# Patient Record
Sex: Male | Born: 1950 | Race: Black or African American | Hispanic: No | Marital: Married | Smoking: Never smoker
Health system: Southern US, Community
[De-identification: ages and names within clinical notes are randomized; demographics above are authoritative.]

## PROBLEM LIST (undated history)

## (undated) DIAGNOSIS — Z9989 Dependence on other enabling machines and devices: Secondary | ICD-10-CM

## (undated) DIAGNOSIS — G4733 Obstructive sleep apnea (adult) (pediatric): Secondary | ICD-10-CM

## (undated) DIAGNOSIS — N189 Chronic kidney disease, unspecified: Secondary | ICD-10-CM

## (undated) DIAGNOSIS — Z8679 Personal history of other diseases of the circulatory system: Secondary | ICD-10-CM

## (undated) DIAGNOSIS — Z8619 Personal history of other infectious and parasitic diseases: Secondary | ICD-10-CM

## (undated) DIAGNOSIS — Z955 Presence of coronary angioplasty implant and graft: Secondary | ICD-10-CM

## (undated) DIAGNOSIS — I252 Old myocardial infarction: Secondary | ICD-10-CM

## (undated) DIAGNOSIS — N401 Enlarged prostate with lower urinary tract symptoms: Secondary | ICD-10-CM

## (undated) DIAGNOSIS — IMO0001 Reserved for inherently not codable concepts without codable children: Secondary | ICD-10-CM

## (undated) DIAGNOSIS — R399 Unspecified symptoms and signs involving the genitourinary system: Secondary | ICD-10-CM

## (undated) DIAGNOSIS — Z7682 Awaiting organ transplant status: Secondary | ICD-10-CM

## (undated) DIAGNOSIS — N185 Chronic kidney disease, stage 5: Secondary | ICD-10-CM

## (undated) DIAGNOSIS — Z9889 Other specified postprocedural states: Secondary | ICD-10-CM

## (undated) DIAGNOSIS — Z992 Dependence on renal dialysis: Secondary | ICD-10-CM

## (undated) DIAGNOSIS — D631 Anemia in chronic kidney disease: Secondary | ICD-10-CM

## (undated) DIAGNOSIS — N2581 Secondary hyperparathyroidism of renal origin: Secondary | ICD-10-CM

## (undated) DIAGNOSIS — D72819 Decreased white blood cell count, unspecified: Secondary | ICD-10-CM

## (undated) DIAGNOSIS — D696 Thrombocytopenia, unspecified: Secondary | ICD-10-CM

## (undated) DIAGNOSIS — I2510 Atherosclerotic heart disease of native coronary artery without angina pectoris: Secondary | ICD-10-CM

## (undated) DIAGNOSIS — E785 Hyperlipidemia, unspecified: Secondary | ICD-10-CM

## (undated) DIAGNOSIS — C61 Malignant neoplasm of prostate: Secondary | ICD-10-CM

## (undated) DIAGNOSIS — Z972 Presence of dental prosthetic device (complete) (partial): Secondary | ICD-10-CM

## (undated) DIAGNOSIS — Z87828 Personal history of other (healed) physical injury and trauma: Secondary | ICD-10-CM

## (undated) HISTORY — DX: Decreased white blood cell count, unspecified: D72.819

## (undated) HISTORY — DX: Chronic kidney disease, stage 5: N18.5

## (undated) HISTORY — DX: Atherosclerotic heart disease of native coronary artery without angina pectoris: I25.10

## (undated) HISTORY — DX: Other specified postprocedural states: Z98.890

## (undated) HISTORY — DX: Hyperlipidemia, unspecified: E78.5

## (undated) HISTORY — DX: Presence of dental prosthetic device (complete) (partial): Z97.2

## (undated) HISTORY — DX: Thrombocytopenia, unspecified: D69.6

---

## 2001-07-17 ENCOUNTER — Emergency Department (HOSPITAL_COMMUNITY): Admission: EM | Admit: 2001-07-17 | Discharge: 2001-07-18 | Payer: Self-pay | Admitting: Emergency Medicine

## 2001-07-17 ENCOUNTER — Encounter: Payer: Self-pay | Admitting: Emergency Medicine

## 2002-02-21 ENCOUNTER — Ambulatory Visit (HOSPITAL_BASED_OUTPATIENT_CLINIC_OR_DEPARTMENT_OTHER): Admission: RE | Admit: 2002-02-21 | Discharge: 2002-02-21 | Payer: Self-pay | Attending: Urology | Admitting: Urology

## 2010-03-05 ENCOUNTER — Inpatient Hospital Stay (HOSPITAL_COMMUNITY): Payer: BLUE CROSS/BLUE SHIELD

## 2010-03-05 ENCOUNTER — Emergency Department (HOSPITAL_COMMUNITY): Payer: BLUE CROSS/BLUE SHIELD

## 2010-03-05 ENCOUNTER — Inpatient Hospital Stay (HOSPITAL_COMMUNITY)
Admission: EM | Admit: 2010-03-05 | Discharge: 2010-04-24 | DRG: 877 | Disposition: A | Discharge status: Rehabilitation Facility | Payer: BLUE CROSS/BLUE SHIELD | Attending: Surgery | Admitting: Surgery

## 2010-03-05 ENCOUNTER — Other Ambulatory Visit: Payer: Self-pay

## 2010-03-05 ENCOUNTER — Other Ambulatory Visit: Payer: Self-pay | Admitting: General Surgery

## 2010-03-05 ENCOUNTER — Other Ambulatory Visit: Payer: Self-pay | Admitting: Surgery

## 2010-03-05 DIAGNOSIS — A419 Sepsis, unspecified organism: Secondary | ICD-10-CM | POA: Diagnosis present

## 2010-03-05 DIAGNOSIS — I4891 Unspecified atrial fibrillation: Secondary | ICD-10-CM | POA: Diagnosis present

## 2010-03-05 DIAGNOSIS — J9600 Acute respiratory failure, unspecified whether with hypoxia or hypercapnia: Secondary | ICD-10-CM | POA: Diagnosis present

## 2010-03-05 DIAGNOSIS — S37039A Laceration of unspecified kidney, unspecified degree, initial encounter: Secondary | ICD-10-CM | POA: Diagnosis present

## 2010-03-05 DIAGNOSIS — K029 Dental caries, unspecified: Secondary | ICD-10-CM | POA: Diagnosis present

## 2010-03-05 DIAGNOSIS — N179 Acute kidney failure, unspecified: Secondary | ICD-10-CM | POA: Diagnosis not present

## 2010-03-05 DIAGNOSIS — N186 End stage renal disease: Secondary | ICD-10-CM | POA: Diagnosis not present

## 2010-03-05 DIAGNOSIS — S2190XA Unspecified open wound of unspecified part of thorax, initial encounter: Secondary | ICD-10-CM | POA: Diagnosis present

## 2010-03-05 DIAGNOSIS — K668 Other specified disorders of peritoneum: Secondary | ICD-10-CM | POA: Diagnosis present

## 2010-03-05 DIAGNOSIS — S2249XA Multiple fractures of ribs, unspecified side, initial encounter for closed fracture: Secondary | ICD-10-CM | POA: Diagnosis present

## 2010-03-05 DIAGNOSIS — S36503A Unspecified injury of sigmoid colon, initial encounter: Secondary | ICD-10-CM | POA: Diagnosis present

## 2010-03-05 DIAGNOSIS — D62 Acute posthemorrhagic anemia: Secondary | ICD-10-CM | POA: Diagnosis present

## 2010-03-05 DIAGNOSIS — S271XXA Traumatic hemothorax, initial encounter: Secondary | ICD-10-CM | POA: Diagnosis present

## 2010-03-05 DIAGNOSIS — T794XXA Traumatic shock, initial encounter: Secondary | ICD-10-CM | POA: Diagnosis present

## 2010-03-05 DIAGNOSIS — S3681XA Injury of peritoneum, initial encounter: Secondary | ICD-10-CM | POA: Diagnosis present

## 2010-03-05 DIAGNOSIS — I959 Hypotension, unspecified: Secondary | ICD-10-CM | POA: Diagnosis present

## 2010-03-05 DIAGNOSIS — S21309A Unspecified open wound of unspecified front wall of thorax with penetration into thoracic cavity, initial encounter: Secondary | ICD-10-CM | POA: Diagnosis present

## 2010-03-05 DIAGNOSIS — IMO0002 Reserved for concepts with insufficient information to code with codable children: Principal | ICD-10-CM | POA: Diagnosis present

## 2010-03-05 HISTORY — PX: OTHER SURGICAL HISTORY: SHX169

## 2010-03-05 LAB — POCT I-STAT 7, (LYTES, BLD GAS, ICA,H+H)
Acid-base deficit: 13 mmol/L — ABNORMAL HIGH (ref 0.0–2.0)
Acid-base deficit: 3 mmol/L — ABNORMAL HIGH (ref 0.0–2.0)
Acid-base deficit: 4 mmol/L — ABNORMAL HIGH (ref 0.0–2.0)
Bicarbonate: 16.2 meq/L — ABNORMAL LOW (ref 20.0–24.0)
Bicarbonate: 22.3 meq/L (ref 20.0–24.0)
Bicarbonate: 23.9 meq/L (ref 20.0–24.0)
Calcium, Ion: 0.79 mmol/L — ABNORMAL LOW (ref 1.12–1.32)
HCT: 23 % — ABNORMAL LOW (ref 39.0–52.0)
HCT: 23 % — ABNORMAL LOW (ref 39.0–52.0)
Hemoglobin: 9.5 g/dL — ABNORMAL LOW (ref 13.0–17.0)
O2 Saturation: 87 %
Patient temperature: 35.8
Patient temperature: 36.2
Sodium: 144 meq/L (ref 135–145)
Sodium: 145 meq/L (ref 135–145)
TCO2: 18 mmol/L (ref 0–100)
TCO2: 25 mmol/L (ref 0–100)
pCO2 arterial: 41.8 mmHg (ref 35.0–45.0)
pH, Arterial: 7.339 — ABNORMAL LOW (ref 7.350–7.450)
pH, Arterial: 7.383 (ref 7.350–7.450)
pO2, Arterial: 182 mmHg — ABNORMAL HIGH (ref 80.0–100.0)
pO2, Arterial: 67 mmHg — ABNORMAL LOW (ref 80.0–100.0)

## 2010-03-05 LAB — POCT I-STAT, CHEM 8
Chloride: 107 meq/L (ref 96–112)
HCT: 32 % — ABNORMAL LOW (ref 39.0–52.0)
Hemoglobin: 10.9 g/dL — ABNORMAL LOW (ref 13.0–17.0)
Potassium: 3.5 meq/L (ref 3.5–5.1)
Sodium: 142 meq/L (ref 135–145)

## 2010-03-05 LAB — CBC
Hemoglobin: 10.2 g/dL — ABNORMAL LOW (ref 13.0–17.0)
MCH: 29.6 pg (ref 26.0–34.0)
MCHC: 34.1 g/dL (ref 30.0–36.0)
Platelets: 86 10*3/uL — ABNORMAL LOW (ref 150–400)
RBC: 3.64 MIL/uL — ABNORMAL LOW (ref 4.22–5.81)
RBC: 3.11 MIL/uL — ABNORMAL LOW (ref 4.22–5.81)

## 2010-03-05 LAB — COMPREHENSIVE METABOLIC PANEL
AST: 141 U/L — ABNORMAL HIGH (ref 0–37)
Albumin: 3 g/dL — ABNORMAL LOW (ref 3.5–5.2)
Calcium: 8.4 mg/dL (ref 8.4–10.5)
Creatinine, Ser: 1.79 mg/dL — ABNORMAL HIGH (ref 0.4–1.5)
GFR calc Af Amer: 47 mL/min — ABNORMAL LOW (ref >60)
GFR calc non Af Amer: 39 mL/min — ABNORMAL LOW (ref >60)
Total Protein: 5.4 g/dL — ABNORMAL LOW (ref 6.0–8.3)

## 2010-03-05 LAB — PROTIME-INR
INR: 1.22 (ref 0.00–1.49)
Prothrombin Time: 15.6 seconds — ABNORMAL HIGH (ref 11.6–15.2)
Prothrombin Time: 19 seconds — ABNORMAL HIGH (ref 11.6–15.2)

## 2010-03-05 LAB — ABO/RH: ABO/RH(D): A NEG

## 2010-03-05 LAB — LACTIC ACID, PLASMA: Lactic Acid, Venous: 10.2 mmol/L — ABNORMAL HIGH (ref 0.5–2.2)

## 2010-03-05 MED ORDER — IOHEXOL 300 MG/ML  SOLN
100.0000 mL | Freq: Once | INTRAMUSCULAR | Status: AC | PRN
  Administered 2010-03-05: 100 mL via INTRAVENOUS

## 2010-03-06 ENCOUNTER — Inpatient Hospital Stay (HOSPITAL_COMMUNITY): Payer: BLUE CROSS/BLUE SHIELD

## 2010-03-06 LAB — PREPARE PLATELETS
Unit division: 0
Unit division: 0
Unit division: 0
Unit division: 0

## 2010-03-06 LAB — PREPARE CRYOPRECIPITATE: Unit division: 0

## 2010-03-06 LAB — CBC
HCT: 35.9 % — ABNORMAL LOW (ref 39.0–52.0)
Hemoglobin: 12.3 g/dL — ABNORMAL LOW (ref 13.0–17.0)
MCH: 28.8 pg (ref 26.0–34.0)
MCHC: 34.3 g/dL (ref 30.0–36.0)
MCV: 84.1 fL (ref 78.0–100.0)
MCV: 84.5 fL (ref 78.0–100.0)
Platelets: 103 10*3/uL — ABNORMAL LOW (ref 150–400)
RBC: 3.61 MIL/uL — ABNORMAL LOW (ref 4.22–5.81)
RDW: 14.8 % (ref 11.5–15.5)
WBC: 1.4 10*3/uL — CL (ref 4.0–10.5)

## 2010-03-06 LAB — BASIC METABOLIC PANEL
BUN: 14 mg/dL (ref 6–23)
BUN: 20 mg/dL (ref 6–23)
CO2: 24 mEq/L (ref 19–32)
Calcium: 8.5 mg/dL (ref 8.4–10.5)
Calcium: 8 mg/dL — ABNORMAL LOW (ref 8.4–10.5)
Chloride: 115 mEq/L — ABNORMAL HIGH (ref 96–112)
Creatinine, Ser: 1.5 mg/dL (ref 0.4–1.5)
Creatinine, Ser: 3.25 mg/dL — ABNORMAL HIGH (ref 0.4–1.5)
GFR calc Af Amer: 36 mL/min — ABNORMAL LOW (ref >60)
GFR calc non Af Amer: 48 mL/min — ABNORMAL LOW (ref >60)
GFR calc non Af Amer: 30 mL/min — ABNORMAL LOW (ref >60)
GFR calc non Af Amer: 20 mL/min — ABNORMAL LOW (ref >60)
Glucose, Bld: 119 mg/dL — ABNORMAL HIGH (ref 70–99)
Sodium: 143 mEq/L (ref 135–145)

## 2010-03-06 LAB — POCT I-STAT 3, ART BLOOD GAS (G3+)
Acid-base deficit: 1 mmol/L (ref 0.0–2.0)
Bicarbonate: 24.5 meq/L — ABNORMAL HIGH (ref 20.0–24.0)
Bicarbonate: 24.8 meq/L — ABNORMAL HIGH (ref 20.0–24.0)
O2 Saturation: 95 %
Patient temperature: 99.5
TCO2: 26 mmol/L (ref 0–100)
TCO2: 25 mmol/L (ref 0–100)
pCO2 arterial: 50.4 mmHg — ABNORMAL HIGH (ref 35.0–45.0)
pCO2 arterial: 42.5 mmHg (ref 35.0–45.0)
pCO2 arterial: 39.2 mmHg (ref 35.0–45.0)
pH, Arterial: 7.303 — ABNORMAL LOW (ref 7.350–7.450)
pH, Arterial: 7.361 (ref 7.350–7.450)
pO2, Arterial: 211 mmHg — ABNORMAL HIGH (ref 80.0–100.0)
pO2, Arterial: 275 mmHg — ABNORMAL HIGH (ref 80.0–100.0)
pO2, Arterial: 82 mmHg (ref 80.0–100.0)
pO2, Arterial: 91 mmHg (ref 80.0–100.0)

## 2010-03-06 LAB — URINE MICROSCOPIC-ADD ON

## 2010-03-06 LAB — PROTIME-INR
Prothrombin Time: 14.8 seconds (ref 11.6–15.2)
Prothrombin Time: 15 seconds (ref 11.6–15.2)

## 2010-03-06 LAB — HEMOGLOBIN AND HEMATOCRIT, BLOOD
HCT: 24.6 % — ABNORMAL LOW (ref 39.0–52.0)
Hemoglobin: 8.5 g/dL — ABNORMAL LOW (ref 13.0–17.0)
Hemoglobin: 10.9 g/dL — ABNORMAL LOW (ref 13.0–17.0)
Hemoglobin: 11.4 g/dL — ABNORMAL LOW (ref 13.0–17.0)

## 2010-03-06 LAB — DIFFERENTIAL
Basophils Absolute: 0 10*3/uL (ref 0.0–0.1)
Basophils Relative: 0 % (ref 0–1)
Eosinophils Absolute: 0 10*3/uL (ref 0.0–0.7)
Lymphocytes Relative: 32 % (ref 12–46)
Monocytes Relative: 7 % (ref 3–12)
Neutrophils Relative %: 61 % (ref 43–77)

## 2010-03-06 LAB — URINALYSIS, ROUTINE W REFLEX MICROSCOPIC
Ketones, ur: 15 mg/dL — AB
Protein, ur: >300 mg/dL — AB
Urine Glucose, Fasting: 100 mg/dL — AB

## 2010-03-06 LAB — CREATININE, URINE, RANDOM: Creatinine, Urine: 114.4 mg/dL

## 2010-03-07 ENCOUNTER — Inpatient Hospital Stay (HOSPITAL_COMMUNITY): Payer: BLUE CROSS/BLUE SHIELD

## 2010-03-07 ENCOUNTER — Other Ambulatory Visit: Payer: Self-pay | Admitting: General Surgery

## 2010-03-07 DIAGNOSIS — R578 Other shock: Secondary | ICD-10-CM

## 2010-03-07 DIAGNOSIS — R6521 Severe sepsis with septic shock: Secondary | ICD-10-CM

## 2010-03-07 DIAGNOSIS — J9600 Acute respiratory failure, unspecified whether with hypoxia or hypercapnia: Secondary | ICD-10-CM

## 2010-03-07 DIAGNOSIS — R6520 Severe sepsis without septic shock: Secondary | ICD-10-CM

## 2010-03-07 DIAGNOSIS — A419 Sepsis, unspecified organism: Secondary | ICD-10-CM

## 2010-03-07 HISTORY — PX: OTHER SURGICAL HISTORY: SHX169

## 2010-03-07 LAB — RENAL FUNCTION PANEL
BUN: 37 mg/dL — ABNORMAL HIGH (ref 6–23)
CO2: 17 mEq/L — ABNORMAL LOW (ref 19–32)
Calcium: 6.7 mg/dL — ABNORMAL LOW (ref 8.4–10.5)
Chloride: 109 mEq/L (ref 96–112)
Creatinine, Ser: 6.9 mg/dL — ABNORMAL HIGH (ref 0.4–1.5)
GFR calc Af Amer: 10 mL/min — ABNORMAL LOW (ref >60)
GFR calc non Af Amer: 8 mL/min — ABNORMAL LOW (ref >60)
Glucose, Bld: 63 mg/dL — ABNORMAL LOW (ref 70–99)
Phosphorus: 7.9 mg/dL — ABNORMAL HIGH (ref 2.3–4.6)
Potassium: 6.1 mEq/L — ABNORMAL HIGH (ref 3.5–5.1)

## 2010-03-07 LAB — PREPARE FRESH FROZEN PLASMA
Unit division: 0
Unit division: 0
Unit division: 0
Unit division: 0
Unit division: 0
Unit division: 0
Unit division: 0

## 2010-03-07 LAB — CBC
HCT: 35.1 % — ABNORMAL LOW (ref 39.0–52.0)
Hemoglobin: 11.8 g/dL — ABNORMAL LOW (ref 13.0–17.0)
Hemoglobin: 11 g/dL — ABNORMAL LOW (ref 13.0–17.0)
MCH: 29.3 pg (ref 26.0–34.0)
MCHC: 33 g/dL (ref 30.0–36.0)
MCV: 87.8 fL (ref 78.0–100.0)
Platelets: 78 10*3/uL — ABNORMAL LOW (ref 150–400)
RDW: 16.6 % — ABNORMAL HIGH (ref 11.5–15.5)
WBC: 5.7 10*3/uL (ref 4.0–10.5)

## 2010-03-07 LAB — BASIC METABOLIC PANEL
CO2: 17 mEq/L — ABNORMAL LOW (ref 19–32)
Chloride: 112 mEq/L (ref 96–112)
Creatinine, Ser: 6.02 mg/dL — ABNORMAL HIGH (ref 0.4–1.5)
GFR calc Af Amer: 12 mL/min — ABNORMAL LOW (ref >60)
Glucose, Bld: 69 mg/dL — ABNORMAL LOW (ref 70–99)

## 2010-03-07 LAB — POCT I-STAT 3, ART BLOOD GAS (G3+)
Bicarbonate: 18 meq/L — ABNORMAL LOW (ref 20.0–24.0)
O2 Saturation: 85 %
Patient temperature: 102
Patient temperature: 101.1
TCO2: 19 mmol/L (ref 0–100)
TCO2: 19 mmol/L (ref 0–100)
pH, Arterial: 7.19 — CL (ref 7.350–7.450)
pH, Arterial: 7.212 — ABNORMAL LOW (ref 7.350–7.450)

## 2010-03-07 LAB — DIFFERENTIAL
Basophils Relative: 0 % (ref 0–1)
Eosinophils Relative: 0 % (ref 0–5)
Lymphs Abs: 1 10*3/uL (ref 0.7–4.0)
Monocytes Relative: 7 % (ref 3–12)
Neutrophils Relative %: 75 % (ref 43–77)

## 2010-03-07 LAB — COMPREHENSIVE METABOLIC PANEL
ALT: 1123 U/L — ABNORMAL HIGH (ref 0–53)
AST: 1907 U/L — ABNORMAL HIGH (ref 0–37)
Albumin: 2.3 g/dL — ABNORMAL LOW (ref 3.5–5.2)
Alkaline Phosphatase: 35 U/L — ABNORMAL LOW (ref 39–117)
Chloride: 111 mEq/L (ref 96–112)
GFR calc Af Amer: 10 mL/min — ABNORMAL LOW (ref >60)
Potassium: 6.4 mEq/L (ref 3.5–5.1)
Sodium: 142 mEq/L (ref 135–145)
Total Bilirubin: 3.5 mg/dL — ABNORMAL HIGH (ref 0.3–1.2)

## 2010-03-07 LAB — PROCALCITONIN: Procalcitonin: >175 ng/mL

## 2010-03-07 LAB — HEMOGLOBIN AND HEMATOCRIT, BLOOD: HCT: 34.8 % — ABNORMAL LOW (ref 39.0–52.0)
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
## 2010-03-08 ENCOUNTER — Inpatient Hospital Stay (HOSPITAL_COMMUNITY): Payer: BLUE CROSS/BLUE SHIELD

## 2010-03-08 HISTORY — PX: OTHER SURGICAL HISTORY: SHX169

## 2010-03-08 LAB — POCT I-STAT 3, ART BLOOD GAS (G3+)
Acid-base deficit: 16 mmol/L — ABNORMAL HIGH (ref 0.0–2.0)
Acid-base deficit: 16 mmol/L — ABNORMAL HIGH (ref 0.0–2.0)
Bicarbonate: 12.3 meq/L — ABNORMAL LOW (ref 20.0–24.0)
Bicarbonate: 14.1 meq/L — ABNORMAL LOW (ref 20.0–24.0)
Bicarbonate: 14.9 meq/L — ABNORMAL LOW (ref 20.0–24.0)
Bicarbonate: 15.6 meq/L — ABNORMAL LOW (ref 20.0–24.0)
Bicarbonate: 15.9 meq/L — ABNORMAL LOW (ref 20.0–24.0)
Bicarbonate: 17.6 meq/L — ABNORMAL LOW (ref 20.0–24.0)
O2 Saturation: 88 %
O2 Saturation: 88 %
Patient temperature: 93.5
Patient temperature: 97.2
Patient temperature: 97.4
Patient temperature: 97.4
Patient temperature: 97.6
TCO2: 17 mmol/L (ref 0–100)
TCO2: 14 mmol/L (ref 0–100)
pCO2 arterial: 40.1 mmHg (ref 35.0–45.0)
pCO2 arterial: 40.8 mmHg (ref 35.0–45.0)
pH, Arterial: 7.07 — CL (ref 7.350–7.450)
pH, Arterial: 7.07 — CL (ref 7.350–7.450)
pH, Arterial: 7.093 — CL (ref 7.350–7.450)
pH, Arterial: 7.097 — CL (ref 7.350–7.450)
pH, Arterial: 7.165 — CL (ref 7.350–7.450)
pH, Arterial: 7.272 — ABNORMAL LOW (ref 7.350–7.450)
pO2, Arterial: 60 mmHg — ABNORMAL LOW (ref 80.0–100.0)
pO2, Arterial: 65 mmHg — ABNORMAL LOW (ref 80.0–100.0)
pO2, Arterial: 69 mmHg — ABNORMAL LOW (ref 80.0–100.0)

## 2010-03-08 LAB — GLUCOSE, CAPILLARY
Glucose-Capillary: 83 mg/dL (ref 70–99)
Glucose-Capillary: 55 mg/dL — ABNORMAL LOW (ref 70–99)
Glucose-Capillary: 87 mg/dL (ref 70–99)

## 2010-03-08 LAB — COMPREHENSIVE METABOLIC PANEL
ALT: 1398 U/L — ABNORMAL HIGH (ref 0–53)
AST: 1994 U/L — ABNORMAL HIGH (ref 0–37)
CO2: 16 mEq/L — ABNORMAL LOW (ref 19–32)
Calcium: 6.5 mg/dL — ABNORMAL LOW (ref 8.4–10.5)
Chloride: 105 mEq/L (ref 96–112)
GFR calc Af Amer: 10 mL/min — ABNORMAL LOW (ref >60)
GFR calc non Af Amer: 9 mL/min — ABNORMAL LOW (ref >60)
Sodium: 139 mEq/L (ref 135–145)
Total Bilirubin: 4.1 mg/dL — ABNORMAL HIGH (ref 0.3–1.2)

## 2010-03-08 LAB — BASIC METABOLIC PANEL
BUN: 29 mg/dL — ABNORMAL HIGH (ref 6–23)
CO2: 13 mEq/L — ABNORMAL LOW (ref 19–32)
Chloride: 109 mEq/L (ref 96–112)
Creatinine, Ser: 5.88 mg/dL — ABNORMAL HIGH (ref 0.4–1.5)
Creatinine, Ser: 5.58 mg/dL — ABNORMAL HIGH (ref 0.4–1.5)
GFR calc Af Amer: 12 mL/min — ABNORMAL LOW (ref >60)
GFR calc non Af Amer: 11 mL/min — ABNORMAL LOW (ref >60)
Potassium: 4.9 mEq/L (ref 3.5–5.1)
Potassium: 4.4 meq/L (ref 3.5–5.1)
Sodium: 141 mEq/L (ref 135–145)

## 2010-03-08 LAB — URINALYSIS, ROUTINE W REFLEX MICROSCOPIC
Ketones, ur: 15 mg/dL — AB
Nitrite: POSITIVE — AB
Specific Gravity, Urine: 1.03 (ref 1.005–1.030)
Urobilinogen, UA: 1 mg/dL (ref 0.0–1.0)

## 2010-03-08 LAB — RENAL FUNCTION PANEL
Albumin: 2.1 g/dL — ABNORMAL LOW (ref 3.5–5.2)
Calcium: 6.2 mg/dL — CL (ref 8.4–10.5)
GFR calc Af Amer: 12 mL/min — ABNORMAL LOW (ref >60)
GFR calc non Af Amer: 10 mL/min — ABNORMAL LOW (ref >60)
Phosphorus: 9.2 mg/dL (ref 2.3–4.6)
Potassium: 5.2 mEq/L — ABNORMAL HIGH (ref 3.5–5.1)
Sodium: 137 mEq/L (ref 135–145)

## 2010-03-08 LAB — HEMOGLOBIN AND HEMATOCRIT, BLOOD
HCT: 34.2 % — ABNORMAL LOW (ref 39.0–52.0)
Hemoglobin: 10.7 g/dL — ABNORMAL LOW (ref 13.0–17.0)

## 2010-03-08 LAB — APTT: aPTT: 42 seconds — ABNORMAL HIGH (ref 24–37)

## 2010-03-08 LAB — CARBOXYHEMOGLOBIN
Carboxyhemoglobin: 0.6 % (ref 0.5–1.5)
Methemoglobin: 1 % (ref 0.0–1.5)
O2 Saturation: 96.3 %
Total hemoglobin: 12.3 g/dL — ABNORMAL LOW (ref 13.5–18.0)

## 2010-03-08 LAB — URINE MICROSCOPIC-ADD ON

## 2010-03-08 LAB — CBC
Hemoglobin: 11.4 g/dL — ABNORMAL LOW (ref 13.0–17.0)
RBC: 3.99 MIL/uL — ABNORMAL LOW (ref 4.22–5.81)
WBC: 9.9 10*3/uL (ref 4.0–10.5)

## 2010-03-09 ENCOUNTER — Inpatient Hospital Stay (HOSPITAL_COMMUNITY): Payer: BLUE CROSS/BLUE SHIELD

## 2010-03-09 DIAGNOSIS — R579 Shock, unspecified: Secondary | ICD-10-CM

## 2010-03-09 DIAGNOSIS — R570 Cardiogenic shock: Secondary | ICD-10-CM

## 2010-03-09 LAB — TYPE AND SCREEN
Unit division: 0
Unit division: 0
Unit division: 0
Unit division: 0
Unit division: 0
Unit division: 0
Unit division: 0
Unit division: 0
Unit division: 0
Unit division: 0
Unit division: 0
Unit division: 0
Unit division: 0
Unit division: 0

## 2010-03-09 LAB — POCT I-STAT 3, ART BLOOD GAS (G3+)
Acid-Base Excess: 4 mmol/L — ABNORMAL HIGH (ref 0.0–2.0)
Acid-Base Excess: 6 mmol/L — ABNORMAL HIGH (ref 0.0–2.0)
Acid-base deficit: 8 mmol/L — ABNORMAL HIGH (ref 0.0–2.0)
Acid-base deficit: 4 mmol/L — ABNORMAL HIGH (ref 0.0–2.0)
Acid-base deficit: 2 mmol/L (ref 0.0–2.0)
Acid-base deficit: 1 mmol/L (ref 0.0–2.0)
Bicarbonate: 18.4 meq/L — ABNORMAL LOW (ref 20.0–24.0)
Bicarbonate: 21.9 meq/L (ref 20.0–24.0)
Bicarbonate: 23 meq/L (ref 20.0–24.0)
Bicarbonate: 24.2 meq/L — ABNORMAL HIGH (ref 20.0–24.0)
Bicarbonate: 31.2 meq/L — ABNORMAL HIGH (ref 20.0–24.0)
Bicarbonate: 34.5 meq/L — ABNORMAL HIGH (ref 20.0–24.0)
O2 Saturation: 93 %
O2 Saturation: 93 %
O2 Saturation: 97 %
O2 Saturation: 97 %
O2 Saturation: 97 %
O2 Saturation: 97 %
Patient temperature: 93.9
Patient temperature: 93.9
Patient temperature: 94.3
Patient temperature: 97.5
Patient temperature: 98.7
TCO2: 20 mmol/L (ref 0–100)
TCO2: 23 mmol/L (ref 0–100)
TCO2: 24 mmol/L (ref 0–100)
TCO2: 25 mmol/L (ref 0–100)
TCO2: 33 mmol/L (ref 0–100)
pH, Arterial: 7.308 — ABNORMAL LOW (ref 7.350–7.450)
pH, Arterial: 7.448 (ref 7.350–7.450)
pH, Arterial: 7.497 — ABNORMAL HIGH (ref 7.350–7.450)
pO2, Arterial: 77 mmHg — ABNORMAL LOW (ref 80.0–100.0)
pO2, Arterial: 81 mmHg (ref 80.0–100.0)
pO2, Arterial: 95 mmHg (ref 80.0–100.0)

## 2010-03-09 LAB — CBC
HCT: 32 % — ABNORMAL LOW (ref 39.0–52.0)
Hemoglobin: 10.9 g/dL — ABNORMAL LOW (ref 13.0–17.0)
MCHC: 34.1 g/dL (ref 30.0–36.0)
MCV: 84.9 fL (ref 78.0–100.0)
RDW: 16 % — ABNORMAL HIGH (ref 11.5–15.5)
WBC: 7.6 10*3/uL (ref 4.0–10.5)

## 2010-03-09 LAB — CARDIAC PANEL(CRET KIN+CKTOT+MB+TROPI)
CK, MB: 151.7 ng/mL (ref 0.3–4.0)
Relative Index: 0.6 (ref 0.0–2.5)
Troponin I: 0.53 ng/mL (ref 0.00–0.06)

## 2010-03-09 LAB — RENAL FUNCTION PANEL
Albumin: 1.6 g/dL — ABNORMAL LOW (ref 3.5–5.2)
CO2: 27 mEq/L (ref 19–32)
Calcium: 5.7 mg/dL — CL (ref 8.4–10.5)
Creatinine, Ser: 4.03 mg/dL — ABNORMAL HIGH (ref 0.4–1.5)
GFR calc Af Amer: 19 mL/min — ABNORMAL LOW (ref >60)
GFR calc non Af Amer: 15 mL/min — ABNORMAL LOW (ref >60)

## 2010-03-09 LAB — COMPREHENSIVE METABOLIC PANEL
AST: 3048 U/L — ABNORMAL HIGH (ref 0–37)
Albumin: 1.9 g/dL — ABNORMAL LOW (ref 3.5–5.2)
Alkaline Phosphatase: 62 U/L (ref 39–117)
CO2: 21 mEq/L (ref 19–32)
Chloride: 92 mEq/L — ABNORMAL LOW (ref 96–112)
Creatinine, Ser: 4.81 mg/dL — ABNORMAL HIGH (ref 0.4–1.5)
GFR calc Af Amer: 15 mL/min — ABNORMAL LOW (ref >60)
GFR calc non Af Amer: 12 mL/min — ABNORMAL LOW (ref >60)
Potassium: 3.8 mEq/L (ref 3.5–5.1)
Total Bilirubin: 5.3 mg/dL — ABNORMAL HIGH (ref 0.3–1.2)

## 2010-03-09 LAB — BASIC METABOLIC PANEL
CO2: 31 meq/L (ref 19–32)
Chloride: 85 mEq/L — ABNORMAL LOW (ref 96–112)
Chloride: 86 meq/L — ABNORMAL LOW (ref 96–112)
Creatinine, Ser: 4.32 mg/dL — ABNORMAL HIGH (ref 0.4–1.5)
Creatinine, Ser: 3.92 mg/dL — ABNORMAL HIGH (ref 0.4–1.5)
GFR calc Af Amer: 17 mL/min — ABNORMAL LOW (ref >60)
GFR calc Af Amer: 19 mL/min — ABNORMAL LOW (ref >60)
GFR calc non Af Amer: 14 mL/min — ABNORMAL LOW (ref >60)
Potassium: 3.3 mEq/L — ABNORMAL LOW (ref 3.5–5.1)
Potassium: 3.2 meq/L — ABNORMAL LOW (ref 3.5–5.1)

## 2010-03-09 LAB — DIFFERENTIAL
Basophils Absolute: 0 10*3/uL (ref 0.0–0.1)
Eosinophils Relative: 0 % (ref 0–5)
Lymphocytes Relative: 7 % — ABNORMAL LOW (ref 12–46)
Lymphs Abs: 0.5 10*3/uL — ABNORMAL LOW (ref 0.7–4.0)
Monocytes Relative: 2 % — ABNORMAL LOW (ref 3–12)
Neutro Abs: 6.9 10*3/uL (ref 1.7–7.7)

## 2010-03-09 LAB — GLUCOSE, CAPILLARY
Glucose-Capillary: 77 mg/dL (ref 70–99)
Glucose-Capillary: 40 mg/dL — CL (ref 70–99)
Glucose-Capillary: 62 mg/dL — ABNORMAL LOW (ref 70–99)
Glucose-Capillary: 85 mg/dL (ref 70–99)

## 2010-03-09 LAB — URINE CULTURE
Colony Count: NO GROWTH
Culture: NO GROWTH

## 2010-03-09 LAB — CK: Total CK: 21406 U/L — ABNORMAL HIGH (ref 7–232)

## 2010-03-09 LAB — HEMOGLOBIN AND HEMATOCRIT, BLOOD
HCT: 28.5 % — ABNORMAL LOW (ref 39.0–52.0)
Hemoglobin: 9.7 g/dL — ABNORMAL LOW (ref 13.0–17.0)
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
## 2010-03-10 ENCOUNTER — Inpatient Hospital Stay (HOSPITAL_COMMUNITY): Payer: BLUE CROSS/BLUE SHIELD

## 2010-03-10 HISTORY — PX: OTHER SURGICAL HISTORY: SHX169

## 2010-03-10 LAB — POCT I-STAT 7, (LYTES, BLD GAS, ICA,H+H)
Acid-base deficit: 11 mmol/L — ABNORMAL HIGH (ref 0.0–2.0)
Acid-base deficit: 18 mmol/L — ABNORMAL HIGH (ref 0.0–2.0)
Bicarbonate: 17.7 meq/L — ABNORMAL LOW (ref 20.0–24.0)
Calcium, Ion: 1.09 mmol/L — ABNORMAL LOW (ref 1.12–1.32)
Calcium, Ion: 0.79 mmol/L — ABNORMAL LOW (ref 1.12–1.32)
HCT: 31 % — ABNORMAL LOW (ref 39.0–52.0)
HCT: 32 % — ABNORMAL LOW (ref 39.0–52.0)
Hemoglobin: 10.5 g/dL — ABNORMAL LOW (ref 13.0–17.0)
O2 Saturation: 81 %
Patient temperature: 39.6
Potassium: 6.2 meq/L — ABNORMAL HIGH (ref 3.5–5.1)
Sodium: 142 meq/L (ref 135–145)
Sodium: 138 meq/L (ref 135–145)
TCO2: 19 mmol/L (ref 0–100)
pCO2 arterial: 57.4 mmHg (ref 35.0–45.0)
pH, Arterial: 7.114 — CL (ref 7.350–7.450)
pO2, Arterial: 70 mmHg — ABNORMAL LOW (ref 80.0–100.0)
pO2, Arterial: 45 mmHg — ABNORMAL LOW (ref 80.0–100.0)

## 2010-03-10 LAB — COMPREHENSIVE METABOLIC PANEL
AST: 1261 U/L — ABNORMAL HIGH (ref 0–37)
Albumin: 1.6 g/dL — ABNORMAL LOW (ref 3.5–5.2)
BUN: 25 mg/dL — ABNORMAL HIGH (ref 6–23)
Calcium: 6.3 mg/dL — CL (ref 8.4–10.5)
Creatinine, Ser: 3.7 mg/dL — ABNORMAL HIGH (ref 0.4–1.5)
GFR calc Af Amer: 20 mL/min — ABNORMAL LOW (ref >60)

## 2010-03-10 LAB — POCT I-STAT 3, ART BLOOD GAS (G3+)
Acid-Base Excess: 3 mmol/L — ABNORMAL HIGH (ref 0.0–2.0)
Bicarbonate: 26.8 meq/L — ABNORMAL HIGH (ref 20.0–24.0)
Bicarbonate: 25.1 meq/L — ABNORMAL HIGH (ref 20.0–24.0)
O2 Saturation: 97 %
Patient temperature: 98.3
TCO2: 26 mmol/L (ref 0–100)
pCO2 arterial: 36.1 mmHg (ref 35.0–45.0)
pCO2 arterial: 43.2 mmHg (ref 35.0–45.0)
pH, Arterial: 7.474 — ABNORMAL HIGH (ref 7.350–7.450)
pH, Arterial: 7.372 (ref 7.350–7.450)
pO2, Arterial: 122 mmHg — ABNORMAL HIGH (ref 80.0–100.0)
pO2, Arterial: 90 mmHg (ref 80.0–100.0)

## 2010-03-10 LAB — BASIC METABOLIC PANEL
BUN: 27 mg/dL — ABNORMAL HIGH (ref 6–23)
CO2: 25 mEq/L (ref 19–32)
CO2: 25 mEq/L (ref 19–32)
Calcium: 6.4 mg/dL — CL (ref 8.4–10.5)
Calcium: 6.4 mg/dL — CL (ref 8.4–10.5)
Chloride: 98 mEq/L (ref 96–112)
Chloride: 97 mEq/L (ref 96–112)
Creatinine, Ser: 3.69 mg/dL — ABNORMAL HIGH (ref 0.4–1.5)
Creatinine, Ser: 3.59 mg/dL — ABNORMAL HIGH (ref 0.4–1.5)
GFR calc Af Amer: 21 mL/min — ABNORMAL LOW (ref >60)
GFR calc Af Amer: 21 mL/min — ABNORMAL LOW (ref >60)
GFR calc non Af Amer: 17 mL/min — ABNORMAL LOW (ref >60)
GFR calc non Af Amer: 17 mL/min — ABNORMAL LOW (ref >60)
Glucose, Bld: 86 mg/dL (ref 70–99)
Glucose, Bld: 180 mg/dL — ABNORMAL HIGH (ref 70–99)
Potassium: 3 mEq/L — ABNORMAL LOW (ref 3.5–5.1)
Potassium: 3.1 mEq/L — ABNORMAL LOW (ref 3.5–5.1)
Sodium: 134 mEq/L — ABNORMAL LOW (ref 135–145)
Sodium: 134 mEq/L — ABNORMAL LOW (ref 135–145)

## 2010-03-10 LAB — CULTURE, BAL-QUANTITATIVE W GRAM STAIN

## 2010-03-10 LAB — RENAL FUNCTION PANEL
Albumin: 1.5 g/dL — ABNORMAL LOW (ref 3.5–5.2)
BUN: 28 mg/dL — ABNORMAL HIGH (ref 6–23)
CO2: 25 mEq/L (ref 19–32)
Calcium: 6.4 mg/dL — CL (ref 8.4–10.5)
Chloride: 98 mEq/L (ref 96–112)
Creatinine, Ser: 3.58 mg/dL — ABNORMAL HIGH (ref 0.4–1.5)
GFR calc Af Amer: 21 mL/min — ABNORMAL LOW (ref >60)
GFR calc non Af Amer: 18 mL/min — ABNORMAL LOW (ref >60)
Glucose, Bld: 176 mg/dL — ABNORMAL HIGH (ref 70–99)
Phosphorus: 4.5 mg/dL (ref 2.3–4.6)
Potassium: 3 mEq/L — ABNORMAL LOW (ref 3.5–5.1)
Sodium: 133 mEq/L — ABNORMAL LOW (ref 135–145)

## 2010-03-10 LAB — MAGNESIUM: Magnesium: 2.1 mg/dL (ref 1.5–2.5)

## 2010-03-10 LAB — GLUCOSE, CAPILLARY
Glucose-Capillary: 57 mg/dL — ABNORMAL LOW (ref 70–99)
Glucose-Capillary: 94 mg/dL (ref 70–99)
Glucose-Capillary: 114 mg/dL — ABNORMAL HIGH (ref 70–99)
Glucose-Capillary: 49 mg/dL — ABNORMAL LOW (ref 70–99)
Glucose-Capillary: 84 mg/dL (ref 70–99)
Glucose-Capillary: 102 mg/dL — ABNORMAL HIGH (ref 70–99)
Glucose-Capillary: 62 mg/dL — ABNORMAL LOW (ref 70–99)
Glucose-Capillary: 123 mg/dL — ABNORMAL HIGH (ref 70–99)
Glucose-Capillary: 66 mg/dL — ABNORMAL LOW (ref 70–99)
Glucose-Capillary: 111 mg/dL — ABNORMAL HIGH (ref 70–99)

## 2010-03-10 LAB — CBC
HCT: 27.9 % — ABNORMAL LOW (ref 39.0–52.0)
Hemoglobin: 9.6 g/dL — ABNORMAL LOW (ref 13.0–17.0)
RBC: 3.34 MIL/uL — ABNORMAL LOW (ref 4.22–5.81)
WBC: 15.9 10*3/uL — ABNORMAL HIGH (ref 4.0–10.5)

## 2010-03-10 LAB — PHOSPHORUS: Phosphorus: 4.4 mg/dL (ref 2.3–4.6)

## 2010-03-10 LAB — PROTIME-INR
INR: 1.54 — ABNORMAL HIGH (ref 0.00–1.49)
Prothrombin Time: 18.7 seconds — ABNORMAL HIGH (ref 11.6–15.2)

## 2010-03-10 LAB — APTT: aPTT: 34 seconds (ref 24–37)

## 2010-03-10 LAB — LIPASE, BLOOD: Lipase: 18 U/L (ref 11–59)
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
## 2010-03-11 ENCOUNTER — Inpatient Hospital Stay (HOSPITAL_COMMUNITY): Payer: BLUE CROSS/BLUE SHIELD

## 2010-03-11 ENCOUNTER — Other Ambulatory Visit (HOSPITAL_COMMUNITY): Payer: Self-pay | Admitting: *Deleted

## 2010-03-11 DIAGNOSIS — A419 Sepsis, unspecified organism: Secondary | ICD-10-CM

## 2010-03-11 LAB — RENAL FUNCTION PANEL
Albumin: 1.6 g/dL — ABNORMAL LOW (ref 3.5–5.2)
Chloride: 103 mEq/L (ref 96–112)
GFR calc Af Amer: 27 mL/min — ABNORMAL LOW (ref >60)
GFR calc non Af Amer: 23 mL/min — ABNORMAL LOW (ref >60)
Phosphorus: 2.8 mg/dL (ref 2.3–4.6)
Potassium: 3.7 mEq/L (ref 3.5–5.1)

## 2010-03-11 LAB — DIFFERENTIAL
Blasts: 0 %
Lymphocytes Relative: 35 % (ref 12–46)
Lymphs Abs: 7.1 10*3/uL — ABNORMAL HIGH (ref 0.7–4.0)
Neutro Abs: 11.2 10*3/uL — ABNORMAL HIGH (ref 1.7–7.7)
Neutrophils Relative %: 55 % (ref 43–77)
Promyelocytes Absolute: 0 %
nRBC: 0 /100 WBC

## 2010-03-11 LAB — POCT I-STAT 3, ART BLOOD GAS (G3+)
Acid-Base Excess: 1 mmol/L (ref 0.0–2.0)
O2 Saturation: 98 %
O2 Saturation: 91 %
Patient temperature: 97.8
TCO2: 28 mmol/L (ref 0–100)
TCO2: 27 mmol/L (ref 0–100)
pCO2 arterial: 43.2 mmHg (ref 35.0–45.0)
pCO2 arterial: 47.1 mmHg — ABNORMAL HIGH (ref 35.0–45.0)
pO2, Arterial: 66 mmHg — ABNORMAL LOW (ref 80.0–100.0)

## 2010-03-11 LAB — COMPREHENSIVE METABOLIC PANEL
ALT: 906 U/L — ABNORMAL HIGH (ref 0–53)
Albumin: 1.6 g/dL — ABNORMAL LOW (ref 3.5–5.2)
Alkaline Phosphatase: 81 U/L (ref 39–117)
BUN: 26 mg/dL — ABNORMAL HIGH (ref 6–23)
Chloride: 100 mEq/L (ref 96–112)
Glucose, Bld: 97 mg/dL (ref 70–99)
Potassium: 3.5 mEq/L (ref 3.5–5.1)
Total Bilirubin: 7.4 mg/dL — ABNORMAL HIGH (ref 0.3–1.2)

## 2010-03-11 LAB — PHOSPHORUS: Phosphorus: 3.1 mg/dL (ref 2.3–4.6)

## 2010-03-11 LAB — CBC
HCT: 26.8 % — ABNORMAL LOW (ref 39.0–52.0)
MCHC: 34 g/dL (ref 30.0–36.0)
MCV: 84.5 fL (ref 78.0–100.0)
RDW: 15.8 % — ABNORMAL HIGH (ref 11.5–15.5)

## 2010-03-11 LAB — CHOLESTEROL, TOTAL: Cholesterol: 87 mg/dL (ref 0–200)

## 2010-03-11 LAB — GLUCOSE, CAPILLARY
Glucose-Capillary: 80 mg/dL (ref 70–99)
Glucose-Capillary: 106 mg/dL — ABNORMAL HIGH (ref 70–99)
Glucose-Capillary: 79 mg/dL (ref 70–99)

## 2010-03-11 LAB — PREPARE PLATELETS: Unit division: 0

## 2010-03-11 LAB — HEMOGLOBIN AND HEMATOCRIT, BLOOD: HCT: 26.6 % — ABNORMAL LOW (ref 39.0–52.0)
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
## 2010-03-12 ENCOUNTER — Inpatient Hospital Stay (HOSPITAL_COMMUNITY): Payer: BLUE CROSS/BLUE SHIELD

## 2010-03-12 ENCOUNTER — Other Ambulatory Visit: Payer: Self-pay | Admitting: General Surgery

## 2010-03-12 HISTORY — PX: OTHER SURGICAL HISTORY: SHX169

## 2010-03-12 LAB — POCT I-STAT 3, ART BLOOD GAS (G3+)
Acid-base deficit: 2 mmol/L (ref 0.0–2.0)
Acid-base deficit: 1 mmol/L (ref 0.0–2.0)
O2 Saturation: 79 %
O2 Saturation: 90 %
O2 Saturation: 84 %
O2 Saturation: 94 %
O2 Saturation: 86 %
O2 Saturation: 95 %
Patient temperature: 97.6
Patient temperature: 98.3
TCO2: 28 mmol/L (ref 0–100)
TCO2: 25 mmol/L (ref 0–100)
TCO2: 27 mmol/L (ref 0–100)
TCO2: 26 mmol/L (ref 0–100)
pCO2 arterial: 50.7 mmHg — ABNORMAL HIGH (ref 35.0–45.0)
pCO2 arterial: 52 mmHg — ABNORMAL HIGH (ref 35.0–45.0)
pCO2 arterial: 47.2 mmHg — ABNORMAL HIGH (ref 35.0–45.0)
pCO2 arterial: 57.7 mmHg (ref 35.0–45.0)
pH, Arterial: 7.314 — ABNORMAL LOW (ref 7.350–7.450)
pH, Arterial: 7.226 — ABNORMAL LOW (ref 7.350–7.450)
pO2, Arterial: 46 mmHg — ABNORMAL LOW (ref 80.0–100.0)
pO2, Arterial: 64 mmHg — ABNORMAL LOW (ref 80.0–100.0)
pO2, Arterial: 80 mmHg (ref 80.0–100.0)

## 2010-03-12 LAB — HEMOGLOBIN AND HEMATOCRIT, BLOOD
HCT: 26.4 % — ABNORMAL LOW (ref 39.0–52.0)
Hemoglobin: 8.9 g/dL — ABNORMAL LOW (ref 13.0–17.0)

## 2010-03-12 LAB — RENAL FUNCTION PANEL
Albumin: 1.7 g/dL — ABNORMAL LOW (ref 3.5–5.2)
BUN: 30 mg/dL — ABNORMAL HIGH (ref 6–23)
CO2: 26 mEq/L (ref 19–32)
Calcium: 7.2 mg/dL — ABNORMAL LOW (ref 8.4–10.5)
Chloride: 103 mEq/L (ref 96–112)
Creatinine, Ser: 3 mg/dL — ABNORMAL HIGH (ref 0.4–1.5)
GFR calc Af Amer: 26 mL/min — ABNORMAL LOW (ref >60)
GFR calc non Af Amer: 21 mL/min — ABNORMAL LOW (ref >60)
Glucose, Bld: 108 mg/dL — ABNORMAL HIGH (ref 70–99)
Phosphorus: 3.5 mg/dL (ref 2.3–4.6)
Potassium: 3.8 mEq/L (ref 3.5–5.1)
Sodium: 137 mEq/L (ref 135–145)
Sodium: 135 mEq/L (ref 135–145)

## 2010-03-12 LAB — POCT I-STAT 4, (NA,K, GLUC, HGB,HCT): Sodium: 137 meq/L (ref 135–145)

## 2010-03-12 LAB — GLUCOSE, CAPILLARY
Glucose-Capillary: 128 mg/dL — ABNORMAL HIGH (ref 70–99)
Glucose-Capillary: 110 mg/dL — ABNORMAL HIGH (ref 70–99)
Glucose-Capillary: 111 mg/dL — ABNORMAL HIGH (ref 70–99)
Glucose-Capillary: 123 mg/dL — ABNORMAL HIGH (ref 70–99)
Glucose-Capillary: 112 mg/dL — ABNORMAL HIGH (ref 70–99)

## 2010-03-12 LAB — CBC
Hemoglobin: 9 g/dL — ABNORMAL LOW (ref 13.0–17.0)
MCHC: 34 g/dL (ref 30.0–36.0)
WBC: 27.6 10*3/uL — ABNORMAL HIGH (ref 4.0–10.5)

## 2010-03-13 ENCOUNTER — Inpatient Hospital Stay (HOSPITAL_COMMUNITY): Payer: BLUE CROSS/BLUE SHIELD

## 2010-03-13 LAB — COMPREHENSIVE METABOLIC PANEL
ALT: 333 U/L — ABNORMAL HIGH (ref 0–53)
AST: 134 U/L — ABNORMAL HIGH (ref 0–37)
CO2: 23 mEq/L (ref 19–32)
Calcium: 6.9 mg/dL — ABNORMAL LOW (ref 8.4–10.5)
Chloride: 107 mEq/L (ref 96–112)
Creatinine, Ser: 2.75 mg/dL — ABNORMAL HIGH (ref 0.4–1.5)
GFR calc Af Amer: 29 mL/min — ABNORMAL LOW (ref >60)
GFR calc non Af Amer: 24 mL/min — ABNORMAL LOW (ref >60)
Glucose, Bld: 97 mg/dL (ref 70–99)
Total Bilirubin: 11.3 mg/dL — ABNORMAL HIGH (ref 0.3–1.2)

## 2010-03-13 LAB — CBC
Hemoglobin: 7.8 g/dL — ABNORMAL LOW (ref 13.0–17.0)
MCHC: 33.6 g/dL (ref 30.0–36.0)
RBC: 2.69 MIL/uL — ABNORMAL LOW (ref 4.22–5.81)
RDW: 16.6 % — ABNORMAL HIGH (ref 11.5–15.5)
WBC: 28.1 10*3/uL — ABNORMAL HIGH (ref 4.0–10.5)

## 2010-03-13 LAB — BASIC METABOLIC PANEL
BUN: 38 mg/dL — ABNORMAL HIGH (ref 6–23)
Calcium: 7.4 mg/dL — ABNORMAL LOW (ref 8.4–10.5)
Creatinine, Ser: 2.87 mg/dL — ABNORMAL HIGH (ref 0.4–1.5)
GFR calc Af Amer: 27 mL/min — ABNORMAL LOW (ref >60)

## 2010-03-13 LAB — DIFFERENTIAL
Basophils Absolute: 0 10*3/uL (ref 0.0–0.1)
Basophils Relative: 0 % (ref 0–1)
Eosinophils Absolute: 0.6 10*3/uL (ref 0.0–0.7)
Eosinophils Relative: 2 % (ref 0–5)
Metamyelocytes Relative: 0 %
Myelocytes: 0 %
Promyelocytes Absolute: 0 %

## 2010-03-13 LAB — GLUCOSE, CAPILLARY
Glucose-Capillary: 100 mg/dL — ABNORMAL HIGH (ref 70–99)
Glucose-Capillary: 112 mg/dL — ABNORMAL HIGH (ref 70–99)
Glucose-Capillary: 122 mg/dL — ABNORMAL HIGH (ref 70–99)
Glucose-Capillary: 122 mg/dL — ABNORMAL HIGH (ref 70–99)

## 2010-03-13 LAB — HEMOGLOBIN AND HEMATOCRIT, BLOOD
HCT: 24.4 % — ABNORMAL LOW (ref 39.0–52.0)
Hemoglobin: 8.3 g/dL — ABNORMAL LOW (ref 13.0–17.0)

## 2010-03-13 LAB — POCT I-STAT 3, ART BLOOD GAS (G3+)
Acid-base deficit: 1 mmol/L (ref 0.0–2.0)
Bicarbonate: 24.1 meq/L — ABNORMAL HIGH (ref 20.0–24.0)
O2 Saturation: 93 %
Patient temperature: 98.6
TCO2: 25 mmol/L (ref 0–100)
pH, Arterial: 7.381 (ref 7.350–7.450)

## 2010-03-13 LAB — CULTURE, BLOOD (ROUTINE X 2): Culture: NO GROWTH

## 2010-03-13 LAB — MAGNESIUM: Magnesium: 2.3 mg/dL (ref 1.5–2.5)

## 2010-03-14 ENCOUNTER — Inpatient Hospital Stay (HOSPITAL_COMMUNITY): Payer: BLUE CROSS/BLUE SHIELD

## 2010-03-14 LAB — COMPREHENSIVE METABOLIC PANEL
Albumin: 1.6 g/dL — ABNORMAL LOW (ref 3.5–5.2)
BUN: 40 mg/dL — ABNORMAL HIGH (ref 6–23)
CO2: 24 mEq/L (ref 19–32)
Calcium: 7.6 mg/dL — ABNORMAL LOW (ref 8.4–10.5)
Chloride: 102 mEq/L (ref 96–112)
Creatinine, Ser: 2.95 mg/dL — ABNORMAL HIGH (ref 0.4–1.5)
GFR calc non Af Amer: 22 mL/min — ABNORMAL LOW (ref >60)
Total Bilirubin: 14.7 mg/dL — ABNORMAL HIGH (ref 0.3–1.2)

## 2010-03-14 LAB — PHOSPHORUS: Phosphorus: 2.9 mg/dL (ref 2.3–4.6)

## 2010-03-14 LAB — BASIC METABOLIC PANEL
BUN: 40 mg/dL — ABNORMAL HIGH (ref 6–23)
CO2: 24 mEq/L (ref 19–32)
Chloride: 103 mEq/L (ref 96–112)
Creatinine, Ser: 3.01 mg/dL — ABNORMAL HIGH (ref 0.4–1.5)
GFR calc Af Amer: 24 mL/min — ABNORMAL LOW (ref >60)
GFR calc non Af Amer: 21 mL/min — ABNORMAL LOW (ref >60)
Glucose, Bld: 109 mg/dL — ABNORMAL HIGH (ref 70–99)
Potassium: 4.4 mEq/L (ref 3.5–5.1)
Sodium: 134 mEq/L — ABNORMAL LOW (ref 135–145)

## 2010-03-14 LAB — RENAL FUNCTION PANEL
CO2: 24 mEq/L (ref 19–32)
Chloride: 104 mEq/L (ref 96–112)
Glucose, Bld: 105 mg/dL — ABNORMAL HIGH (ref 70–99)
Phosphorus: 3.9 mg/dL (ref 2.3–4.6)
Potassium: 4.4 mEq/L (ref 3.5–5.1)
Sodium: 135 mEq/L (ref 135–145)

## 2010-03-14 LAB — GLUCOSE, CAPILLARY
Glucose-Capillary: 102 mg/dL — ABNORMAL HIGH (ref 70–99)
Glucose-Capillary: 112 mg/dL — ABNORMAL HIGH (ref 70–99)

## 2010-03-14 LAB — POCT I-STAT 3, ART BLOOD GAS (G3+)
Acid-Base Excess: 1 mmol/L (ref 0.0–2.0)
Bicarbonate: 25.8 meq/L — ABNORMAL HIGH (ref 20.0–24.0)
O2 Saturation: 89 %
Patient temperature: 99
pO2, Arterial: 59 mmHg — ABNORMAL LOW (ref 80.0–100.0)

## 2010-03-14 LAB — CBC
MCH: 28.6 pg (ref 26.0–34.0)
MCHC: 33.1 g/dL (ref 30.0–36.0)
MCV: 86.4 fL (ref 78.0–100.0)
Platelets: 52 10*3/uL — ABNORMAL LOW (ref 150–400)
RBC: 2.8 MIL/uL — ABNORMAL LOW (ref 4.22–5.81)

## 2010-03-14 LAB — CULTURE, RESPIRATORY W GRAM STAIN

## 2010-03-14 LAB — URINE CULTURE

## 2010-03-14 LAB — SURGICAL PCR SCREEN: Staphylococcus aureus: NEGATIVE
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
## 2010-03-15 ENCOUNTER — Inpatient Hospital Stay (HOSPITAL_COMMUNITY): Payer: BLUE CROSS/BLUE SHIELD

## 2010-03-15 HISTORY — PX: OTHER SURGICAL HISTORY: SHX169

## 2010-03-15 LAB — POCT I-STAT 7, (LYTES, BLD GAS, ICA,H+H)
Acid-Base Excess: 1 mmol/L (ref 0.0–2.0)
Acid-base deficit: 1 mmol/L (ref 0.0–2.0)
Bicarbonate: 23.3 meq/L (ref 20.0–24.0)
Bicarbonate: 25 meq/L — ABNORMAL HIGH (ref 20.0–24.0)
Calcium, Ion: 1.08 mmol/L — ABNORMAL LOW (ref 1.12–1.32)
HCT: 26 % — ABNORMAL LOW (ref 39.0–52.0)
Hemoglobin: 8.8 g/dL — ABNORMAL LOW (ref 13.0–17.0)
O2 Saturation: 96 %
O2 Saturation: 94 %
O2 Saturation: 84 %
Patient temperature: 97.8
Potassium: 4.3 meq/L (ref 3.5–5.1)
Potassium: 4.1 meq/L (ref 3.5–5.1)
Sodium: 134 meq/L — ABNORMAL LOW (ref 135–145)
TCO2: 24 mmol/L (ref 0–100)
TCO2: 26 mmol/L (ref 0–100)
pCO2 arterial: 39.2 mmHg (ref 35.0–45.0)
pH, Arterial: 7.399 (ref 7.350–7.450)
pO2, Arterial: 83 mmHg (ref 80.0–100.0)
pO2, Arterial: 70 mmHg — ABNORMAL LOW (ref 80.0–100.0)

## 2010-03-15 LAB — POCT I-STAT EG7
Acid-Base Excess: 1 mmol/L (ref 0.0–2.0)
Bicarbonate: 26.2 meq/L — ABNORMAL HIGH (ref 20.0–24.0)
Bicarbonate: 27.6 meq/L — ABNORMAL HIGH (ref 20.0–24.0)
Calcium, Ion: 0.69 mmol/L — CL (ref 1.12–1.32)
HCT: 36 % — ABNORMAL LOW (ref 39.0–52.0)
HCT: 27 % — ABNORMAL LOW (ref 39.0–52.0)
Hemoglobin: 12.2 g/dL — ABNORMAL LOW (ref 13.0–17.0)
Hemoglobin: 9.2 g/dL — ABNORMAL LOW (ref 13.0–17.0)
Hemoglobin: 9.2 g/dL — ABNORMAL LOW (ref 13.0–17.0)
O2 Saturation: 36 %
Potassium: 4.1 meq/L (ref 3.5–5.1)
Sodium: 136 meq/L (ref 135–145)
pCO2, Ven: 50.3 mmHg — ABNORMAL HIGH (ref 45.0–50.0)
pH, Ven: 7.323 — ABNORMAL HIGH (ref 7.250–7.300)
pH, Ven: 7.342 — ABNORMAL HIGH (ref 7.250–7.300)
pO2, Ven: 29 mmHg — CL (ref 30.0–45.0)
pO2, Ven: 22 mmHg — CL (ref 30.0–45.0)

## 2010-03-15 LAB — BASIC METABOLIC PANEL
BUN: 54 mg/dL — ABNORMAL HIGH (ref 6–23)
CO2: 24 mEq/L (ref 19–32)
Calcium: 8.1 mg/dL — ABNORMAL LOW (ref 8.4–10.5)
Chloride: 98 mEq/L (ref 96–112)
Creatinine, Ser: 3.62 mg/dL — ABNORMAL HIGH (ref 0.4–1.5)
Creatinine, Ser: 3.32 mg/dL — ABNORMAL HIGH (ref 0.4–1.5)
GFR calc Af Amer: 23 mL/min — ABNORMAL LOW (ref >60)
GFR calc non Af Amer: 17 mL/min — ABNORMAL LOW (ref >60)
Glucose, Bld: 118 mg/dL — ABNORMAL HIGH (ref 70–99)
Sodium: 132 mEq/L — ABNORMAL LOW (ref 135–145)

## 2010-03-15 LAB — COMPREHENSIVE METABOLIC PANEL
AST: 72 U/L — ABNORMAL HIGH (ref 0–37)
Albumin: 1.4 g/dL — ABNORMAL LOW (ref 3.5–5.2)
Chloride: 102 mEq/L (ref 96–112)
Creatinine, Ser: 3.17 mg/dL — ABNORMAL HIGH (ref 0.4–1.5)
GFR calc Af Amer: 24 mL/min — ABNORMAL LOW (ref >60)
Potassium: 4.5 mEq/L (ref 3.5–5.1)
Total Bilirubin: 17.4 mg/dL — ABNORMAL HIGH (ref 0.3–1.2)

## 2010-03-15 LAB — CBC
HCT: 24.7 % — ABNORMAL LOW (ref 39.0–52.0)
MCV: 86.7 fL (ref 78.0–100.0)
RDW: 17.1 % — ABNORMAL HIGH (ref 11.5–15.5)
WBC: 32 10*3/uL — ABNORMAL HIGH (ref 4.0–10.5)

## 2010-03-15 LAB — CROSSMATCH
Antibody Screen: NEGATIVE
Unit division: 0

## 2010-03-15 LAB — RENAL FUNCTION PANEL
Albumin: 1.4 g/dL — ABNORMAL LOW (ref 3.5–5.2)
BUN: 54 mg/dL — ABNORMAL HIGH (ref 6–23)
CO2: 23 mEq/L (ref 19–32)
Calcium: 7.9 mg/dL — ABNORMAL LOW (ref 8.4–10.5)
Creatinine, Ser: 3.53 mg/dL — ABNORMAL HIGH (ref 0.4–1.5)
GFR calc Af Amer: 22 mL/min — ABNORMAL LOW (ref >60)
GFR calc non Af Amer: 18 mL/min — ABNORMAL LOW (ref >60)

## 2010-03-15 LAB — CULTURE, BAL-QUANTITATIVE W GRAM STAIN

## 2010-03-16 ENCOUNTER — Inpatient Hospital Stay (HOSPITAL_COMMUNITY): Payer: BLUE CROSS/BLUE SHIELD

## 2010-03-16 LAB — POCT I-STAT 7, (LYTES, BLD GAS, ICA,H+H)
Acid-Base Excess: 2 mmol/L (ref 0.0–2.0)
Acid-Base Excess: 5 mmol/L — ABNORMAL HIGH (ref 0.0–2.0)
Acid-Base Excess: 5 mmol/L — ABNORMAL HIGH (ref 0.0–2.0)
Acid-Base Excess: 9 mmol/L — ABNORMAL HIGH (ref 0.0–2.0)
Bicarbonate: 27.3 meq/L — ABNORMAL HIGH (ref 20.0–24.0)
Bicarbonate: 28.4 meq/L — ABNORMAL HIGH (ref 20.0–24.0)
Bicarbonate: 29.2 meq/L — ABNORMAL HIGH (ref 20.0–24.0)
Calcium, Ion: 1.08 mmol/L — ABNORMAL LOW (ref 1.12–1.32)
Calcium, Ion: 1.06 mmol/L — ABNORMAL LOW (ref 1.12–1.32)
HCT: 25 % — ABNORMAL LOW (ref 39.0–52.0)
HCT: 21 % — ABNORMAL LOW (ref 39.0–52.0)
Hemoglobin: 8.5 g/dL — ABNORMAL LOW (ref 13.0–17.0)
Hemoglobin: 8.5 g/dL — ABNORMAL LOW (ref 13.0–17.0)
Hemoglobin: 8.5 g/dL — ABNORMAL LOW (ref 13.0–17.0)
O2 Saturation: 92 %
O2 Saturation: 93 %
O2 Saturation: 94 %
O2 Saturation: 98 %
Patient temperature: 98.4
Patient temperature: 98.2
Potassium: 3.9 meq/L (ref 3.5–5.1)
Potassium: 4.3 meq/L (ref 3.5–5.1)
Sodium: 134 meq/L — ABNORMAL LOW (ref 135–145)
Sodium: 134 meq/L — ABNORMAL LOW (ref 135–145)
Sodium: 134 meq/L — ABNORMAL LOW (ref 135–145)
TCO2: 29 mmol/L (ref 0–100)
TCO2: 30 mmol/L (ref 0–100)
TCO2: 30 mmol/L (ref 0–100)
pCO2 arterial: 37 mmHg (ref 35.0–45.0)
pH, Arterial: 7.427 (ref 7.350–7.450)
pH, Arterial: 7.453 — ABNORMAL HIGH (ref 7.350–7.450)
pH, Arterial: 7.465 — ABNORMAL HIGH (ref 7.350–7.450)
pO2, Arterial: 61 mmHg — ABNORMAL LOW (ref 80.0–100.0)

## 2010-03-16 LAB — RENAL FUNCTION PANEL
Albumin: 1.3 g/dL — ABNORMAL LOW (ref 3.5–5.2)
BUN: 44 mg/dL — ABNORMAL HIGH (ref 6–23)
GFR calc Af Amer: 26 mL/min — ABNORMAL LOW (ref >60)
GFR calc non Af Amer: 21 mL/min — ABNORMAL LOW (ref >60)
Phosphorus: 4.2 mg/dL (ref 2.3–4.6)
Potassium: 3.9 mEq/L (ref 3.5–5.1)

## 2010-03-16 LAB — POCT I-STAT EG7
Acid-Base Excess: 1 mmol/L (ref 0.0–2.0)
Acid-Base Excess: 3 mmol/L — ABNORMAL HIGH (ref 0.0–2.0)
Acid-Base Excess: 6 mmol/L — ABNORMAL HIGH (ref 0.0–2.0)
Bicarbonate: 27.8 meq/L — ABNORMAL HIGH (ref 20.0–24.0)
Bicarbonate: 28.5 meq/L — ABNORMAL HIGH (ref 20.0–24.0)
Bicarbonate: 29.5 meq/L — ABNORMAL HIGH (ref 20.0–24.0)
Bicarbonate: 31.9 meq/L — ABNORMAL HIGH (ref 20.0–24.0)
Calcium, Ion: 0.69 mmol/L — CL (ref 1.12–1.32)
Calcium, Ion: 0.7 mmol/L — CL (ref 1.12–1.32)
Calcium, Ion: 0.74 mmol/L — CL (ref 1.12–1.32)
HCT: 26 % — ABNORMAL LOW (ref 39.0–52.0)
HCT: 27 % — ABNORMAL LOW (ref 39.0–52.0)
HCT: 28 % — ABNORMAL LOW (ref 39.0–52.0)
Hemoglobin: 8.8 g/dL — ABNORMAL LOW (ref 13.0–17.0)
Hemoglobin: 9.2 g/dL — ABNORMAL LOW (ref 13.0–17.0)
Hemoglobin: 9.5 g/dL — ABNORMAL LOW (ref 13.0–17.0)
O2 Saturation: 42 %
O2 Saturation: 42 %
Patient temperature: 97.4
Patient temperature: 97.4
Patient temperature: 97.7
Potassium: 3.9 meq/L (ref 3.5–5.1)
Sodium: 136 meq/L (ref 135–145)
TCO2: 31 mmol/L (ref 0–100)
TCO2: 32 mmol/L (ref 0–100)
TCO2: 33 mmol/L (ref 0–100)
pCO2, Ven: 49.4 mmHg (ref 45.0–50.0)
pCO2, Ven: 50.5 mmHg — ABNORMAL HIGH (ref 45.0–50.0)
pCO2, Ven: 50.7 mmHg — ABNORMAL HIGH (ref 45.0–50.0)
pCO2, Ven: 50.7 mmHg — ABNORMAL HIGH (ref 45.0–50.0)
pCO2, Ven: 52.9 mmHg — ABNORMAL HIGH (ref 45.0–50.0)
pH, Ven: 7.36 — ABNORMAL HIGH (ref 7.250–7.300)
pH, Ven: 7.384 — ABNORMAL HIGH (ref 7.250–7.300)
pH, Ven: 7.387 — ABNORMAL HIGH (ref 7.250–7.300)
pO2, Ven: 21 mmHg — CL (ref 30.0–45.0)
pO2, Ven: 24 mmHg — CL (ref 30.0–45.0)
pO2, Ven: 25 mmHg — CL (ref 30.0–45.0)

## 2010-03-16 LAB — POCT ACTIVATED CLOTTING TIME
Activated Clotting Time: 140 s
Activated Clotting Time: 152 s
Activated Clotting Time: 152 s
Activated Clotting Time: 164 s
Activated Clotting Time: 175 s

## 2010-03-16 LAB — CBC
HCT: 23 % — ABNORMAL LOW (ref 39.0–52.0)
MCHC: 34.3 g/dL (ref 30.0–36.0)
Platelets: 65 10*3/uL — ABNORMAL LOW (ref 150–400)
RDW: 17.6 % — ABNORMAL HIGH (ref 11.5–15.5)

## 2010-03-16 LAB — GLUCOSE, CAPILLARY: Glucose-Capillary: 114 mg/dL — ABNORMAL HIGH (ref 70–99)

## 2010-03-16 LAB — COMPREHENSIVE METABOLIC PANEL
ALT: 108 U/L — ABNORMAL HIGH (ref 0–53)
Calcium: 8.6 mg/dL (ref 8.4–10.5)
GFR calc Af Amer: 24 mL/min — ABNORMAL LOW (ref >60)
Glucose, Bld: 114 mg/dL — ABNORMAL HIGH (ref 70–99)
Sodium: 134 mEq/L — ABNORMAL LOW (ref 135–145)
Total Protein: 5.2 g/dL — ABNORMAL LOW (ref 6.0–8.3)

## 2010-03-16 LAB — MAGNESIUM: Magnesium: 2.5 mg/dL (ref 1.5–2.5)

## 2010-03-17 ENCOUNTER — Inpatient Hospital Stay (HOSPITAL_COMMUNITY): Payer: BLUE CROSS/BLUE SHIELD

## 2010-03-17 LAB — CBC
HCT: 23.6 % — ABNORMAL LOW (ref 39.0–52.0)
Hemoglobin: 7.8 g/dL — ABNORMAL LOW (ref 13.0–17.0)
MCH: 28.8 pg (ref 26.0–34.0)
RBC: 2.71 MIL/uL — ABNORMAL LOW (ref 4.22–5.81)

## 2010-03-17 LAB — POCT ACTIVATED CLOTTING TIME
Activated Clotting Time: 169 s
Activated Clotting Time: 175 s
Activated Clotting Time: 163 s
Activated Clotting Time: 158 s
Activated Clotting Time: 169 s
Activated Clotting Time: 170 s
Activated Clotting Time: 175 s
Activated Clotting Time: 163 s

## 2010-03-17 LAB — RENAL FUNCTION PANEL
Albumin: 1.3 g/dL — ABNORMAL LOW (ref 3.5–5.2)
Chloride: 100 mEq/L (ref 96–112)
Creatinine, Ser: 3.02 mg/dL — ABNORMAL HIGH (ref 0.4–1.5)
GFR calc Af Amer: 26 mL/min — ABNORMAL LOW (ref >60)
GFR calc non Af Amer: 21 mL/min — ABNORMAL LOW (ref >60)
Potassium: 4.4 mEq/L (ref 3.5–5.1)
Sodium: 131 mEq/L — ABNORMAL LOW (ref 135–145)

## 2010-03-17 LAB — COMPREHENSIVE METABOLIC PANEL
ALT: 87 U/L — ABNORMAL HIGH (ref 0–53)
Albumin: 1.4 g/dL — ABNORMAL LOW (ref 3.5–5.2)
Alkaline Phosphatase: 92 U/L (ref 39–117)
Chloride: 100 mEq/L (ref 96–112)
Glucose, Bld: 78 mg/dL (ref 70–99)
Potassium: 4.2 mEq/L (ref 3.5–5.1)
Sodium: 134 mEq/L — ABNORMAL LOW (ref 135–145)
Total Bilirubin: 14.1 mg/dL — ABNORMAL HIGH (ref 0.3–1.2)
Total Protein: 5.6 g/dL — ABNORMAL LOW (ref 6.0–8.3)

## 2010-03-17 LAB — POCT I-STAT 3, ART BLOOD GAS (G3+)
Acid-Base Excess: 2 mmol/L (ref 0.0–2.0)
Bicarbonate: 26.9 meq/L — ABNORMAL HIGH (ref 20.0–24.0)
O2 Saturation: 91 %
Patient temperature: 99.3
TCO2: 28 mmol/L (ref 0–100)

## 2010-03-17 LAB — DIFFERENTIAL
Basophils Relative: 1 % (ref 0–1)
Eosinophils Absolute: 0.6 10*3/uL (ref 0.0–0.7)
Lymphocytes Relative: 9 % — ABNORMAL LOW (ref 12–46)
Monocytes Relative: 5 % (ref 3–12)
Neutrophils Relative %: 83 % — ABNORMAL HIGH (ref 43–77)

## 2010-03-17 LAB — GLUCOSE, CAPILLARY
Glucose-Capillary: 100 mg/dL — ABNORMAL HIGH (ref 70–99)
Glucose-Capillary: 100 mg/dL — ABNORMAL HIGH (ref 70–99)
Glucose-Capillary: 94 mg/dL (ref 70–99)
Glucose-Capillary: 117 mg/dL — ABNORMAL HIGH (ref 70–99)
Glucose-Capillary: 121 mg/dL — ABNORMAL HIGH (ref 70–99)

## 2010-03-17 LAB — IRON AND TIBC: Iron: 56 ug/dL (ref 42–135)

## 2010-03-17 LAB — APTT: aPTT: 43 seconds — ABNORMAL HIGH (ref 24–37)

## 2010-03-17 LAB — CHOLESTEROL, TOTAL: Cholesterol: 100 mg/dL (ref 0–200)

## 2010-03-17 LAB — FERRITIN: Ferritin: 449 ng/mL — ABNORMAL HIGH (ref 22–322)

## 2010-03-18 ENCOUNTER — Inpatient Hospital Stay (HOSPITAL_COMMUNITY): Payer: BLUE CROSS/BLUE SHIELD

## 2010-03-18 HISTORY — PX: OTHER SURGICAL HISTORY: SHX169

## 2010-03-18 LAB — COMPREHENSIVE METABOLIC PANEL
Albumin: 1.4 g/dL — ABNORMAL LOW (ref 3.5–5.2)
BUN: 43 mg/dL — ABNORMAL HIGH (ref 6–23)
Creatinine, Ser: 3.05 mg/dL — ABNORMAL HIGH (ref 0.4–1.5)
Total Protein: 6.6 g/dL (ref 6.0–8.3)

## 2010-03-18 LAB — RENAL FUNCTION PANEL
BUN: 55 mg/dL — ABNORMAL HIGH (ref 6–23)
CO2: 23 mEq/L (ref 19–32)
Glucose, Bld: 108 mg/dL — ABNORMAL HIGH (ref 70–99)
Potassium: 4.9 mEq/L (ref 3.5–5.1)
Sodium: 132 mEq/L — ABNORMAL LOW (ref 135–145)

## 2010-03-18 LAB — POCT I-STAT 3, ART BLOOD GAS (G3+)
Acid-base deficit: 3 mmol/L — ABNORMAL HIGH (ref 0.0–2.0)
O2 Saturation: 91 %
TCO2: 24 mmol/L (ref 0–100)
pCO2 arterial: 40.6 mmHg (ref 35.0–45.0)
pO2, Arterial: 63 mmHg — ABNORMAL LOW (ref 80.0–100.0)

## 2010-03-18 LAB — POCT ACTIVATED CLOTTING TIME
Activated Clotting Time: 181 s
Activated Clotting Time: 175 s

## 2010-03-18 LAB — GLUCOSE, CAPILLARY
Glucose-Capillary: 117 mg/dL — ABNORMAL HIGH (ref 70–99)
Glucose-Capillary: 122 mg/dL — ABNORMAL HIGH (ref 70–99)
Glucose-Capillary: 17 mg/dL — CL (ref 70–99)
Glucose-Capillary: 95 mg/dL (ref 70–99)

## 2010-03-18 LAB — CBC
MCV: 89.4 fL (ref 78.0–100.0)
Platelets: 191 10*3/uL (ref 150–400)
RBC: 2.65 MIL/uL — ABNORMAL LOW (ref 4.22–5.81)
RDW: 19.4 % — ABNORMAL HIGH (ref 11.5–15.5)
WBC: 28.5 10*3/uL — ABNORMAL HIGH (ref 4.0–10.5)

## 2010-03-18 LAB — SURGICAL PCR SCREEN
MRSA, PCR: NEGATIVE
Staphylococcus aureus: NEGATIVE

## 2010-03-18 LAB — PHOSPHORUS: Phosphorus: 3.8 mg/dL (ref 2.3–4.6)

## 2010-03-18 LAB — APTT: aPTT: 52 seconds — ABNORMAL HIGH (ref 24–37)

## 2010-03-19 ENCOUNTER — Inpatient Hospital Stay (HOSPITAL_COMMUNITY): Payer: BLUE CROSS/BLUE SHIELD

## 2010-03-19 LAB — POCT I-STAT 3, ART BLOOD GAS (G3+)
Bicarbonate: 24.3 meq/L — ABNORMAL HIGH (ref 20.0–24.0)
TCO2: 25 mmol/L (ref 0–100)
pCO2 arterial: 35.7 mmHg (ref 35.0–45.0)
pH, Arterial: 7.447 (ref 7.350–7.450)
pO2, Arterial: 132 mmHg — ABNORMAL HIGH (ref 80.0–100.0)

## 2010-03-19 LAB — COMPREHENSIVE METABOLIC PANEL
ALT: 95 U/L — ABNORMAL HIGH (ref 0–53)
AST: 98 U/L — ABNORMAL HIGH (ref 0–37)
Albumin: 1.4 g/dL — ABNORMAL LOW (ref 3.5–5.2)
Chloride: 94 mEq/L — ABNORMAL LOW (ref 96–112)
Creatinine, Ser: 3.37 mg/dL — ABNORMAL HIGH (ref 0.4–1.5)
GFR calc Af Amer: 23 mL/min — ABNORMAL LOW (ref >60)
Potassium: 5.1 mEq/L (ref 3.5–5.1)
Sodium: 132 mEq/L — ABNORMAL LOW (ref 135–145)
Total Bilirubin: 15.5 mg/dL — ABNORMAL HIGH (ref 0.3–1.2)

## 2010-03-19 LAB — RENAL FUNCTION PANEL
BUN: 53 mg/dL — ABNORMAL HIGH (ref 6–23)
CO2: 20 mEq/L (ref 19–32)
Calcium: 8.2 mg/dL — ABNORMAL LOW (ref 8.4–10.5)
Glucose, Bld: 65 mg/dL — ABNORMAL LOW (ref 70–99)
Phosphorus: 6.1 mg/dL — ABNORMAL HIGH (ref 2.3–4.6)
Sodium: 134 mEq/L — ABNORMAL LOW (ref 135–145)

## 2010-03-19 LAB — POCT ACTIVATED CLOTTING TIME
Activated Clotting Time: 158 s
Activated Clotting Time: 176 s
Activated Clotting Time: 169 s
Activated Clotting Time: 187 s
Activated Clotting Time: 193 s

## 2010-03-19 LAB — GLUCOSE, CAPILLARY
Glucose-Capillary: 69 mg/dL — ABNORMAL LOW (ref 70–99)
Glucose-Capillary: 72 mg/dL (ref 70–99)
Glucose-Capillary: 72 mg/dL (ref 70–99)
Glucose-Capillary: 72 mg/dL (ref 70–99)
Glucose-Capillary: 76 mg/dL (ref 70–99)

## 2010-03-19 LAB — CBC
Platelets: ADEQUATE 10*3/uL (ref 150–400)
RBC: 2.74 MIL/uL — ABNORMAL LOW (ref 4.22–5.81)
RDW: 19.9 % — ABNORMAL HIGH (ref 11.5–15.5)
WBC: 31.7 10*3/uL — ABNORMAL HIGH (ref 4.0–10.5)

## 2010-03-19 LAB — APTT: aPTT: 51 seconds — ABNORMAL HIGH (ref 24–37)
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
## 2010-03-20 ENCOUNTER — Inpatient Hospital Stay (HOSPITAL_COMMUNITY): Payer: BLUE CROSS/BLUE SHIELD

## 2010-03-20 DIAGNOSIS — N179 Acute kidney failure, unspecified: Secondary | ICD-10-CM

## 2010-03-20 LAB — DIFFERENTIAL
Basophils Relative: 1 % (ref 0–1)
Eosinophils Absolute: 0.6 10*3/uL (ref 0.0–0.7)
Eosinophils Relative: 2 % (ref 0–5)
Lymphs Abs: 2.5 10*3/uL (ref 0.7–4.0)
Monocytes Absolute: 2.5 10*3/uL — ABNORMAL HIGH (ref 0.1–1.0)
Neutrophils Relative %: 79 % — ABNORMAL HIGH (ref 43–77)

## 2010-03-20 LAB — COMPREHENSIVE METABOLIC PANEL
Albumin: 1.4 g/dL — ABNORMAL LOW (ref 3.5–5.2)
BUN: 57 mg/dL — ABNORMAL HIGH (ref 6–23)
Calcium: 8.1 mg/dL — ABNORMAL LOW (ref 8.4–10.5)
Creatinine, Ser: 3.18 mg/dL — ABNORMAL HIGH (ref 0.4–1.5)
Glucose, Bld: 67 mg/dL — ABNORMAL LOW (ref 70–99)
Total Protein: 7.3 g/dL (ref 6.0–8.3)

## 2010-03-20 LAB — POCT ACTIVATED CLOTTING TIME
Activated Clotting Time: 164 s
Activated Clotting Time: 176 s
Activated Clotting Time: 181 s
Activated Clotting Time: 211 s
Activated Clotting Time: 234 s
Activated Clotting Time: 323 s
Activated Clotting Time: 328 s
Activated Clotting Time: 334 s
Activated Clotting Time: 370 s
Activated Clotting Time: 629 s

## 2010-03-20 LAB — RENAL FUNCTION PANEL
Albumin: 1.4 g/dL — ABNORMAL LOW (ref 3.5–5.2)
CO2: 21 mEq/L (ref 19–32)
Chloride: 103 mEq/L (ref 96–112)
Creatinine, Ser: 3 mg/dL — ABNORMAL HIGH (ref 0.4–1.5)
GFR calc Af Amer: 26 mL/min — ABNORMAL LOW (ref >60)
GFR calc non Af Amer: 21 mL/min — ABNORMAL LOW (ref >60)
Potassium: 5 mEq/L (ref 3.5–5.1)

## 2010-03-20 LAB — POCT I-STAT 3, ART BLOOD GAS (G3+)
Bicarbonate: 23.4 meq/L (ref 20.0–24.0)
O2 Saturation: 92 %
TCO2: 25 mmol/L (ref 0–100)
pCO2 arterial: 39.3 mmHg (ref 35.0–45.0)
pH, Arterial: 7.384 (ref 7.350–7.450)
pO2, Arterial: 66 mmHg — ABNORMAL LOW (ref 80.0–100.0)

## 2010-03-20 LAB — GLUCOSE, CAPILLARY
Glucose-Capillary: 183 mg/dL — ABNORMAL HIGH (ref 70–99)
Glucose-Capillary: 66 mg/dL — ABNORMAL LOW (ref 70–99)
Glucose-Capillary: 67 mg/dL — ABNORMAL LOW (ref 70–99)
Glucose-Capillary: 72 mg/dL (ref 70–99)
Glucose-Capillary: 75 mg/dL (ref 70–99)

## 2010-03-20 LAB — CBC
MCH: 28.7 pg (ref 26.0–34.0)
MCV: 90.9 fL (ref 78.0–100.0)
Platelets: 467 10*3/uL — ABNORMAL HIGH (ref 150–400)
RBC: 2.65 MIL/uL — ABNORMAL LOW (ref 4.22–5.81)
RDW: 20.4 % — ABNORMAL HIGH (ref 11.5–15.5)

## 2010-03-20 LAB — MAGNESIUM: Magnesium: 3 mg/dL — ABNORMAL HIGH (ref 1.5–2.5)

## 2010-03-20 LAB — PHOSPHORUS: Phosphorus: 6.6 mg/dL — ABNORMAL HIGH (ref 2.3–4.6)

## 2010-03-20 LAB — APTT: aPTT: 39 seconds — ABNORMAL HIGH (ref 24–37)
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
## 2010-03-21 ENCOUNTER — Inpatient Hospital Stay (HOSPITAL_COMMUNITY): Payer: BLUE CROSS/BLUE SHIELD

## 2010-03-21 HISTORY — PX: OTHER SURGICAL HISTORY: SHX169

## 2010-03-21 LAB — CBC
Hemoglobin: 6.4 g/dL — CL (ref 13.0–17.0)
MCH: 29.9 pg (ref 26.0–34.0)
MCHC: 31.4 g/dL (ref 30.0–36.0)
MCHC: 32.5 g/dL (ref 30.0–36.0)
Platelets: 563 10*3/uL — ABNORMAL HIGH (ref 150–400)
RBC: 2.21 MIL/uL — ABNORMAL LOW (ref 4.22–5.81)
RBC: 2.61 MIL/uL — ABNORMAL LOW (ref 4.22–5.81)
WBC: 21.5 10*3/uL — ABNORMAL HIGH (ref 4.0–10.5)

## 2010-03-21 LAB — POCT I-STAT 3, ART BLOOD GAS (G3+)
Bicarbonate: 22.5 meq/L (ref 20.0–24.0)
O2 Saturation: 96 %
TCO2: 24 mmol/L (ref 0–100)
pCO2 arterial: 34.8 mmHg — ABNORMAL LOW (ref 35.0–45.0)
pO2, Arterial: 82 mmHg (ref 80.0–100.0)

## 2010-03-21 LAB — RENAL FUNCTION PANEL
CO2: 18 mEq/L — ABNORMAL LOW (ref 19–32)
Calcium: 6.7 mg/dL — ABNORMAL LOW (ref 8.4–10.5)
GFR calc Af Amer: 36 mL/min — ABNORMAL LOW (ref >60)
GFR calc non Af Amer: 29 mL/min — ABNORMAL LOW (ref >60)
Sodium: 134 mEq/L — ABNORMAL LOW (ref 135–145)

## 2010-03-21 LAB — DIFFERENTIAL
Eosinophils Relative: 3 % (ref 0–5)
Lymphocytes Relative: 9 % — ABNORMAL LOW (ref 12–46)
Monocytes Absolute: 1.7 10*3/uL — ABNORMAL HIGH (ref 0.1–1.0)
Monocytes Relative: 8 % (ref 3–12)
Neutrophils Relative %: 79 % — ABNORMAL HIGH (ref 43–77)

## 2010-03-21 LAB — POCT ACTIVATED CLOTTING TIME
Activated Clotting Time: 181 s
Activated Clotting Time: 175 s
Activated Clotting Time: 175 s

## 2010-03-21 LAB — GLUCOSE, CAPILLARY
Glucose-Capillary: 52 mg/dL — ABNORMAL LOW (ref 70–99)
Glucose-Capillary: 64 mg/dL — ABNORMAL LOW (ref 70–99)

## 2010-03-21 LAB — MAGNESIUM: Magnesium: 2.3 mg/dL (ref 1.5–2.5)

## 2010-03-22 ENCOUNTER — Inpatient Hospital Stay (HOSPITAL_COMMUNITY): Payer: BLUE CROSS/BLUE SHIELD

## 2010-03-22 DIAGNOSIS — J9600 Acute respiratory failure, unspecified whether with hypoxia or hypercapnia: Secondary | ICD-10-CM

## 2010-03-22 LAB — CBC
HCT: 25.5 % — ABNORMAL LOW (ref 39.0–52.0)
MCH: 29.7 pg (ref 26.0–34.0)
MCHC: 31.4 g/dL (ref 30.0–36.0)
RDW: 20.9 % — ABNORMAL HIGH (ref 11.5–15.5)

## 2010-03-22 LAB — POCT I-STAT 3, ART BLOOD GAS (G3+)
Acid-base deficit: 1 mmol/L (ref 0.0–2.0)
Bicarbonate: 22.6 meq/L (ref 20.0–24.0)
O2 Saturation: 99 %
Patient temperature: 98.7
TCO2: 24 mmol/L (ref 0–100)
pH, Arterial: 7.439 (ref 7.350–7.450)

## 2010-03-22 LAB — POCT ACTIVATED CLOTTING TIME
Activated Clotting Time: 175 s
Activated Clotting Time: 164 s
Activated Clotting Time: 181 s
Activated Clotting Time: 187 s
Activated Clotting Time: 193 s
Activated Clotting Time: 193 s
Activated Clotting Time: 170 s
Activated Clotting Time: 181 s
Activated Clotting Time: 205 s
Activated Clotting Time: 193 s
Activated Clotting Time: 193 s

## 2010-03-22 LAB — RENAL FUNCTION PANEL
Albumin: 1.4 g/dL — ABNORMAL LOW (ref 3.5–5.2)
BUN: 43 mg/dL — ABNORMAL HIGH (ref 6–23)
CO2: 25 mEq/L (ref 19–32)
Chloride: 102 mEq/L (ref 96–112)
Creatinine, Ser: 2.79 mg/dL — ABNORMAL HIGH (ref 0.4–1.5)
Potassium: 3.9 mEq/L (ref 3.5–5.1)

## 2010-03-22 LAB — GLUCOSE, CAPILLARY
Glucose-Capillary: 95 mg/dL (ref 70–99)
Glucose-Capillary: 110 mg/dL — ABNORMAL HIGH (ref 70–99)
Glucose-Capillary: 89 mg/dL (ref 70–99)
Glucose-Capillary: 103 mg/dL — ABNORMAL HIGH (ref 70–99)

## 2010-03-22 LAB — COMPREHENSIVE METABOLIC PANEL
AST: 248 U/L — ABNORMAL HIGH (ref 0–37)
BUN: 45 mg/dL — ABNORMAL HIGH (ref 6–23)
CO2: 22 mEq/L (ref 19–32)
Calcium: 8.2 mg/dL — ABNORMAL LOW (ref 8.4–10.5)
Creatinine, Ser: 2.85 mg/dL — ABNORMAL HIGH (ref 0.4–1.5)
GFR calc Af Amer: 28 mL/min — ABNORMAL LOW (ref >60)
GFR calc non Af Amer: 23 mL/min — ABNORMAL LOW (ref >60)
Total Bilirubin: 11.9 mg/dL — ABNORMAL HIGH (ref 0.3–1.2)

## 2010-03-22 LAB — DIFFERENTIAL
Basophils Absolute: 0.3 10*3/uL — ABNORMAL HIGH (ref 0.0–0.1)
Eosinophils Absolute: 0.8 10*3/uL — ABNORMAL HIGH (ref 0.0–0.7)
Lymphocytes Relative: 10 % — ABNORMAL LOW (ref 12–46)
Monocytes Absolute: 2.2 10*3/uL — ABNORMAL HIGH (ref 0.1–1.0)
Neutro Abs: 21.9 10*3/uL — ABNORMAL HIGH (ref 1.7–7.7)
Neutrophils Relative %: 78 % — ABNORMAL HIGH (ref 43–77)

## 2010-03-22 LAB — CATH TIP CULTURE: Culture: 6

## 2010-03-22 LAB — MAGNESIUM: Magnesium: 2.8 mg/dL — ABNORMAL HIGH (ref 1.5–2.5)

## 2010-03-22 LAB — PHOSPHORUS: Phosphorus: 4.7 mg/dL — ABNORMAL HIGH (ref 2.3–4.6)

## 2010-03-23 ENCOUNTER — Inpatient Hospital Stay (HOSPITAL_COMMUNITY): Payer: BLUE CROSS/BLUE SHIELD

## 2010-03-23 LAB — GLUCOSE, CAPILLARY
Glucose-Capillary: 84 mg/dL (ref 70–99)
Glucose-Capillary: 87 mg/dL (ref 70–99)
Glucose-Capillary: 71 mg/dL (ref 70–99)
Glucose-Capillary: 78 mg/dL (ref 70–99)
Glucose-Capillary: 84 mg/dL (ref 70–99)
Glucose-Capillary: 140 mg/dL — ABNORMAL HIGH (ref 70–99)
Glucose-Capillary: 66 mg/dL — ABNORMAL LOW (ref 70–99)

## 2010-03-23 LAB — APTT: aPTT: 82 seconds — ABNORMAL HIGH (ref 24–37)

## 2010-03-23 LAB — CBC
HCT: 23.8 % — ABNORMAL LOW (ref 39.0–52.0)
Hemoglobin: 7.5 g/dL — ABNORMAL LOW (ref 13.0–17.0)
MCHC: 31.5 g/dL (ref 30.0–36.0)
MCV: 96.4 fL (ref 78.0–100.0)

## 2010-03-23 LAB — RENAL FUNCTION PANEL
Albumin: 1.3 g/dL — ABNORMAL LOW (ref 3.5–5.2)
CO2: 23 mEq/L (ref 19–32)
Chloride: 102 mEq/L (ref 96–112)
Chloride: 96 mEq/L (ref 96–112)
Creatinine, Ser: 2.79 mg/dL — ABNORMAL HIGH (ref 0.4–1.5)
GFR calc Af Amer: 28 mL/min — ABNORMAL LOW (ref >60)
GFR calc Af Amer: 22 mL/min — ABNORMAL LOW (ref >60)
GFR calc non Af Amer: 23 mL/min — ABNORMAL LOW (ref >60)
GFR calc non Af Amer: 18 mL/min — ABNORMAL LOW (ref >60)
Glucose, Bld: 74 mg/dL (ref 70–99)
Potassium: 4.3 mEq/L (ref 3.5–5.1)

## 2010-03-23 LAB — POCT ACTIVATED CLOTTING TIME: Activated Clotting Time: 205 s

## 2010-03-24 ENCOUNTER — Inpatient Hospital Stay (HOSPITAL_COMMUNITY): Payer: BLUE CROSS/BLUE SHIELD

## 2010-03-24 LAB — CBC
HCT: 21.7 % — ABNORMAL LOW (ref 39.0–52.0)
MCH: 29.9 pg (ref 26.0–34.0)
MCH: 30.4 pg (ref 26.0–34.0)
MCHC: 31.6 g/dL (ref 30.0–36.0)
MCV: 96.9 fL (ref 78.0–100.0)
MCV: 96 fL (ref 78.0–100.0)
Platelets: 558 10*3/uL — ABNORMAL HIGH (ref 150–400)
Platelets: 499 10*3/uL — ABNORMAL HIGH (ref 150–400)
RDW: 20.3 % — ABNORMAL HIGH (ref 11.5–15.5)
RDW: 19 % — ABNORMAL HIGH (ref 11.5–15.5)
WBC: 29.6 10*3/uL — ABNORMAL HIGH (ref 4.0–10.5)

## 2010-03-24 LAB — COMPREHENSIVE METABOLIC PANEL
Albumin: 1.2 g/dL — ABNORMAL LOW (ref 3.5–5.2)
Alkaline Phosphatase: 119 U/L — ABNORMAL HIGH (ref 39–117)
BUN: 66 mg/dL — ABNORMAL HIGH (ref 6–23)
Creatinine, Ser: 5.14 mg/dL — ABNORMAL HIGH (ref 0.4–1.5)
Glucose, Bld: 132 mg/dL — ABNORMAL HIGH (ref 70–99)
Potassium: 4.7 mEq/L (ref 3.5–5.1)
Total Bilirubin: 10 mg/dL — ABNORMAL HIGH (ref 0.3–1.2)
Total Protein: 7.1 g/dL (ref 6.0–8.3)

## 2010-03-24 LAB — GLUCOSE, CAPILLARY
Glucose-Capillary: 63 mg/dL — ABNORMAL LOW (ref 70–99)
Glucose-Capillary: 67 mg/dL — ABNORMAL LOW (ref 70–99)
Glucose-Capillary: 93 mg/dL (ref 70–99)
Glucose-Capillary: 85 mg/dL (ref 70–99)
Glucose-Capillary: 69 mg/dL — ABNORMAL LOW (ref 70–99)
Glucose-Capillary: 105 mg/dL — ABNORMAL HIGH (ref 70–99)

## 2010-03-24 LAB — BASIC METABOLIC PANEL
BUN: 77 mg/dL — ABNORMAL HIGH (ref 6–23)
Calcium: 8 mg/dL — ABNORMAL LOW (ref 8.4–10.5)
Creatinine, Ser: 6.13 mg/dL — ABNORMAL HIGH (ref 0.4–1.5)
GFR calc Af Amer: 11 mL/min — ABNORMAL LOW (ref >60)
GFR calc non Af Amer: 9 mL/min — ABNORMAL LOW (ref >60)

## 2010-03-24 LAB — MAGNESIUM: Magnesium: 2.7 mg/dL — ABNORMAL HIGH (ref 1.5–2.5)

## 2010-03-24 LAB — PHOSPHORUS: Phosphorus: 5.9 mg/dL — ABNORMAL HIGH (ref 2.3–4.6)

## 2010-03-25 ENCOUNTER — Inpatient Hospital Stay (HOSPITAL_COMMUNITY): Payer: BLUE CROSS/BLUE SHIELD

## 2010-03-25 LAB — RENAL FUNCTION PANEL
CO2: 19 mEq/L (ref 19–32)
CO2: 20 mEq/L (ref 19–32)
Calcium: 8.3 mg/dL — ABNORMAL LOW (ref 8.4–10.5)
Calcium: 8 mg/dL — ABNORMAL LOW (ref 8.4–10.5)
Creatinine, Ser: 8.09 mg/dL — ABNORMAL HIGH (ref 0.4–1.5)
Creatinine, Ser: 7.5 mg/dL — ABNORMAL HIGH (ref 0.4–1.5)
GFR calc Af Amer: 8 mL/min — ABNORMAL LOW (ref >60)
GFR calc Af Amer: 9 mL/min — ABNORMAL LOW (ref >60)
GFR calc non Af Amer: 7 mL/min — ABNORMAL LOW (ref >60)
Glucose, Bld: 86 mg/dL (ref 70–99)
Phosphorus: 7.9 mg/dL — ABNORMAL HIGH (ref 2.3–4.6)
Phosphorus: 8.3 mg/dL — ABNORMAL HIGH (ref 2.3–4.6)
Sodium: 130 mEq/L — ABNORMAL LOW (ref 135–145)

## 2010-03-25 LAB — CBC
HCT: 20.8 % — ABNORMAL LOW (ref 39.0–52.0)
Hemoglobin: 6.6 g/dL — CL (ref 13.0–17.0)
MCH: 30.1 pg (ref 26.0–34.0)
MCH: 29.8 pg (ref 26.0–34.0)
MCHC: 31.7 g/dL (ref 30.0–36.0)
Platelets: 392 10*3/uL (ref 150–400)
RBC: 2.58 MIL/uL — ABNORMAL LOW (ref 4.22–5.81)
RDW: 18.9 % — ABNORMAL HIGH (ref 11.5–15.5)
WBC: 23.8 10*3/uL — ABNORMAL HIGH (ref 4.0–10.5)

## 2010-03-25 LAB — CATH TIP CULTURE: Culture: 40

## 2010-03-25 LAB — CROSSMATCH: Unit division: 0

## 2010-03-25 LAB — DIFFERENTIAL
Basophils Absolute: 0.2 10*3/uL — ABNORMAL HIGH (ref 0.0–0.1)
Eosinophils Absolute: 0.8 10*3/uL — ABNORMAL HIGH (ref 0.0–0.7)
Eosinophils Relative: 3 % (ref 0–5)
Lymphocytes Relative: 12 % (ref 12–46)
Monocytes Absolute: 2.4 10*3/uL — ABNORMAL HIGH (ref 0.1–1.0)

## 2010-03-25 LAB — GLUCOSE, CAPILLARY
Glucose-Capillary: 101 mg/dL — ABNORMAL HIGH (ref 70–99)
Glucose-Capillary: 84 mg/dL (ref 70–99)
Glucose-Capillary: 96 mg/dL (ref 70–99)

## 2010-03-25 LAB — CULTURE, BLOOD (ROUTINE X 2)
Culture  Setup Time: 201203071332
Culture  Setup Time: 201203071332
Culture: NO GROWTH

## 2010-03-25 LAB — PROTIME-INR
INR: 1.45 (ref 0.00–1.49)
Prothrombin Time: 17.8 seconds — ABNORMAL HIGH (ref 11.6–15.2)

## 2010-03-25 LAB — CLOSTRIDIUM DIFFICILE BY PCR: Toxigenic C. Difficile by PCR: NEGATIVE

## 2010-03-25 LAB — CORTISOL: Cortisol, Plasma: 13.4 ug/dL

## 2010-03-26 ENCOUNTER — Inpatient Hospital Stay (HOSPITAL_COMMUNITY): Payer: BLUE CROSS/BLUE SHIELD

## 2010-03-26 HISTORY — PX: OTHER SURGICAL HISTORY: SHX169

## 2010-03-26 LAB — CULTURE, RESPIRATORY W GRAM STAIN

## 2010-03-26 LAB — POCT ACTIVATED CLOTTING TIME
Activated Clotting Time: 169 s
Activated Clotting Time: 170 s
Activated Clotting Time: 175 s
Activated Clotting Time: 175 s
Activated Clotting Time: 175 s
Activated Clotting Time: 176 s
Activated Clotting Time: 181 s
Activated Clotting Time: 181 s

## 2010-03-26 LAB — VANCOMYCIN, RANDOM: Vancomycin Rm: 16.8 ug/mL

## 2010-03-26 LAB — CBC
HCT: 23 % — ABNORMAL LOW (ref 39.0–52.0)
MCH: 29.7 pg (ref 26.0–34.0)
MCV: 92.4 fL (ref 78.0–100.0)
RBC: 2.49 MIL/uL — ABNORMAL LOW (ref 4.22–5.81)
RDW: 20.1 % — ABNORMAL HIGH (ref 11.5–15.5)
WBC: 21.4 10*3/uL — ABNORMAL HIGH (ref 4.0–10.5)

## 2010-03-26 LAB — RENAL FUNCTION PANEL
CO2: 23 mEq/L (ref 19–32)
Chloride: 101 mEq/L (ref 96–112)
GFR calc Af Amer: 14 mL/min — ABNORMAL LOW (ref >60)
GFR calc non Af Amer: 11 mL/min — ABNORMAL LOW (ref >60)
Sodium: 134 mEq/L — ABNORMAL LOW (ref 135–145)

## 2010-03-26 LAB — GLUCOSE, CAPILLARY
Glucose-Capillary: 103 mg/dL — ABNORMAL HIGH (ref 70–99)
Glucose-Capillary: 109 mg/dL — ABNORMAL HIGH (ref 70–99)

## 2010-03-27 ENCOUNTER — Inpatient Hospital Stay (HOSPITAL_COMMUNITY): Payer: BLUE CROSS/BLUE SHIELD

## 2010-03-27 DIAGNOSIS — R509 Fever, unspecified: Secondary | ICD-10-CM

## 2010-03-27 LAB — DIFFERENTIAL
Eosinophils Absolute: 0.6 10*3/uL (ref 0.0–0.7)
Eosinophils Relative: 3 % (ref 0–5)
Lymphocytes Relative: 14 % (ref 12–46)
Monocytes Absolute: 2.2 10*3/uL — ABNORMAL HIGH (ref 0.1–1.0)
Neutrophils Relative %: 71 % (ref 43–77)

## 2010-03-27 LAB — CBC
MCV: 92.3 fL (ref 78.0–100.0)
Platelets: 334 10*3/uL (ref 150–400)
RBC: 2.21 MIL/uL — ABNORMAL LOW (ref 4.22–5.81)
WBC: 20.3 10*3/uL — ABNORMAL HIGH (ref 4.0–10.5)

## 2010-03-27 LAB — HEMOCCULT GUIAC POC 1CARD (OFFICE): Fecal Occult Bld: POSITIVE

## 2010-03-27 LAB — IRON AND TIBC
Iron: 22 ug/dL — ABNORMAL LOW (ref 42–135)
TIBC: 165 ug/dL — ABNORMAL LOW (ref 215–435)

## 2010-03-27 LAB — PREPARE RBC (CROSSMATCH)

## 2010-03-27 LAB — BASIC METABOLIC PANEL
Chloride: 106 mEq/L (ref 96–112)
GFR calc Af Amer: 10 mL/min — ABNORMAL LOW (ref >60)
Potassium: 4.5 mEq/L (ref 3.5–5.1)

## 2010-03-27 LAB — GLUCOSE, CAPILLARY: Glucose-Capillary: 113 mg/dL — ABNORMAL HIGH (ref 70–99)

## 2010-03-27 NOTE — Op Note (Addendum)
  NAMEABDULLA, POOLEY                 ACCOUNT NO.:  [id number]  MEDICAL RECORD NO.:  [id number]           PATIENT TYPE:  I  LOCATION:  2104                         FACILITY:  [facility]  PHYSICIAN:  Di Kindle. Edilia Bo, M.D.DATE OF BIRTH:  Aug 16, 1950  DATE OF PROCEDURE: DATE OF DISCHARGE:                              OPERATIVE REPORT   PREOPERATIVE DIAGNOSIS:  Acute renal failure.  POSTOPERATIVE DIAGNOSIS:  Acute renal failure.  PROCEDURE:  Ultrasound-guided placement of right IJ 19-cm Diatek catheter.  SURGEON:  Di Kindle. Edilia Bo, M.D.  ANESTHESIA:  General.  TECHNIQUE:  The patient was taken to the operating room and received a general anesthetic.  The neck was gently turned slightly to the right. No shoulder roll was placed.  The neck and upper chest were prepped and draped in usual sterile fashion.  An ultrasound guidance to the right IJ was cannulated and a guidewire introduced into the superior vena cava under fluoroscopic control.  The tract over the wire was dilated, and then a dilator and peel-away sheath were advanced over the wire and the wire and dilator removed.  The catheter was then passed through the peel- away sheath and positioned in the right atrium.  The exit site for the catheter was selected and the skin anesthetized between the two areas. The catheter was then brought through the tunnel, cut to the appropriate length, and the distal ports were attached.  Both ports withdrew easily. We then flushed with heparinized saline and filled with concentrated heparin.  The catheter was secured at its exit site with a 3-0 nylon suture.  The IJ cannulation site was closed with a 4-0 subcuticular stitch.  Sterile dressing was applied.  The patient tolerated the procedure well.  Then, Dr. Lindie Spruce performed the tracheostomy as dictated separately.     Di Kindle. Edilia Bo, M.D.     CSD/MEDQ  D:  03/21/2010  T:  03/22/2010  Job:  [id number]  Electronically  Signed by Waverly Ferrari M.D. on 03/27/2010 10:52:06 AM
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
## 2010-03-28 ENCOUNTER — Inpatient Hospital Stay (HOSPITAL_COMMUNITY): Payer: BLUE CROSS/BLUE SHIELD

## 2010-03-28 LAB — DIFFERENTIAL
Basophils Absolute: 0.1 10*3/uL (ref 0.0–0.1)
Eosinophils Absolute: 0.7 10*3/uL (ref 0.0–0.7)
Lymphocytes Relative: 17 % (ref 12–46)
Monocytes Relative: 11 % (ref 3–12)
Neutro Abs: 9.6 10*3/uL — ABNORMAL HIGH (ref 1.7–7.7)
Neutrophils Relative %: 66 % (ref 43–77)

## 2010-03-28 LAB — COMPREHENSIVE METABOLIC PANEL
ALT: 169 U/L — ABNORMAL HIGH (ref 0–53)
AST: 149 U/L — ABNORMAL HIGH (ref 0–37)
Albumin: 1.2 g/dL — ABNORMAL LOW (ref 3.5–5.2)
Alkaline Phosphatase: 143 U/L — ABNORMAL HIGH (ref 39–117)
Chloride: 108 mEq/L (ref 96–112)
GFR calc Af Amer: 13 mL/min — ABNORMAL LOW (ref >60)
Potassium: 3.7 mEq/L (ref 3.5–5.1)
Sodium: 138 mEq/L (ref 135–145)
Total Bilirubin: 9.9 mg/dL — ABNORMAL HIGH (ref 0.3–1.2)
Total Protein: 6.8 g/dL (ref 6.0–8.3)

## 2010-03-28 LAB — CROSSMATCH
DAT, IgG: POSITIVE
Unit division: 0
Unit division: 0

## 2010-03-28 LAB — RENAL FUNCTION PANEL
Albumin: 1.2 g/dL — ABNORMAL LOW (ref 3.5–5.2)
BUN: 53 mg/dL — ABNORMAL HIGH (ref 6–23)
Chloride: 106 mEq/L (ref 96–112)
Creatinine, Ser: 4.11 mg/dL — ABNORMAL HIGH (ref 0.4–1.5)
Glucose, Bld: 102 mg/dL — ABNORMAL HIGH (ref 70–99)
Phosphorus: 4.9 mg/dL — ABNORMAL HIGH (ref 2.3–4.6)
Potassium: 3.5 mEq/L (ref 3.5–5.1)

## 2010-03-28 LAB — POCT I-STAT 3, ART BLOOD GAS (G3+)
Patient temperature: 95.7
TCO2: 24 mmol/L (ref 0–100)
pCO2 arterial: 38.2 mmHg (ref 35.0–45.0)
pH, Arterial: 7.372 (ref 7.350–7.450)

## 2010-03-28 LAB — CBC
MCV: 90.2 fL (ref 78.0–100.0)
Platelets: 248 10*3/uL (ref 150–400)
RBC: 2.44 MIL/uL — ABNORMAL LOW (ref 4.22–5.81)
RDW: 19.9 % — ABNORMAL HIGH (ref 11.5–15.5)
WBC: 14.5 10*3/uL — ABNORMAL HIGH (ref 4.0–10.5)

## 2010-03-28 LAB — GLUCOSE, CAPILLARY
Glucose-Capillary: 108 mg/dL — ABNORMAL HIGH (ref 70–99)
Glucose-Capillary: 113 mg/dL — ABNORMAL HIGH (ref 70–99)
Glucose-Capillary: 121 mg/dL — ABNORMAL HIGH (ref 70–99)

## 2010-03-28 LAB — PHOSPHORUS: Phosphorus: 5.5 mg/dL — ABNORMAL HIGH (ref 2.3–4.6)

## 2010-03-29 ENCOUNTER — Inpatient Hospital Stay (HOSPITAL_COMMUNITY): Payer: BLUE CROSS/BLUE SHIELD

## 2010-03-29 LAB — RENAL FUNCTION PANEL
Albumin: 1.1 g/dL — ABNORMAL LOW (ref 3.5–5.2)
Albumin: 1.2 g/dL — ABNORMAL LOW (ref 3.5–5.2)
Calcium: 7.7 mg/dL — ABNORMAL LOW (ref 8.4–10.5)
GFR calc Af Amer: 26 mL/min — ABNORMAL LOW (ref >60)
GFR calc non Af Amer: 21 mL/min — ABNORMAL LOW (ref >60)
GFR calc non Af Amer: 22 mL/min — ABNORMAL LOW (ref >60)
Phosphorus: 3.5 mg/dL (ref 2.3–4.6)
Phosphorus: 3.2 mg/dL (ref 2.3–4.6)
Potassium: 3.4 mEq/L — ABNORMAL LOW (ref 3.5–5.1)
Sodium: 138 mEq/L (ref 135–145)

## 2010-03-29 LAB — POCT I-STAT 3, ART BLOOD GAS (G3+)
Bicarbonate: 23.7 meq/L (ref 20.0–24.0)
TCO2: 25 mmol/L (ref 0–100)
pH, Arterial: 7.426 (ref 7.350–7.450)
pO2, Arterial: 123 mmHg — ABNORMAL HIGH (ref 80.0–100.0)

## 2010-03-29 LAB — DIFFERENTIAL
Basophils Absolute: 0.1 10*3/uL (ref 0.0–0.1)
Lymphs Abs: 1.6 10*3/uL (ref 0.7–4.0)
Monocytes Absolute: 1.4 10*3/uL — ABNORMAL HIGH (ref 0.1–1.0)

## 2010-03-29 LAB — GLUCOSE, CAPILLARY
Glucose-Capillary: 101 mg/dL — ABNORMAL HIGH (ref 70–99)
Glucose-Capillary: 105 mg/dL — ABNORMAL HIGH (ref 70–99)
Glucose-Capillary: 83 mg/dL (ref 70–99)
Glucose-Capillary: 95 mg/dL (ref 70–99)
Glucose-Capillary: 96 mg/dL (ref 70–99)
Glucose-Capillary: 97 mg/dL (ref 70–99)

## 2010-03-29 LAB — CULTURE, BLOOD (ROUTINE X 2)
Culture  Setup Time: 201203112356
Culture  Setup Time: 201203112356

## 2010-03-29 LAB — CBC
HCT: 20.5 % — ABNORMAL LOW (ref 39.0–52.0)
MCHC: 32.2 g/dL (ref 30.0–36.0)
RDW: 19.9 % — ABNORMAL HIGH (ref 11.5–15.5)

## 2010-03-29 LAB — MAGNESIUM: Magnesium: 2.3 mg/dL (ref 1.5–2.5)
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
## 2010-03-29 NOTE — Op Note (Addendum)
[name], MCHAN                 ACCOUNT NO.:  [id number]  MEDICAL RECORD NO.:  [id number]           PATIENT TYPE:  I  LOCATION:  2104                         FACILITY:  [facility]  PHYSICIAN:  Cherylynn Ridges, M.D.    DATE OF BIRTH:  25-Nov-1950  DATE OF PROCEDURE:  03/26/2010 DATE OF DISCHARGE:                              OPERATIVE REPORT   PREOPERATIVE DIAGNOSIS:  Open abdomen with VAC dressing in place and partial necrosis of colostomy.  POSTOPERATIVE DIAGNOSIS:  Open abdomen with VAC dressing in place and partial necrosis of colostomy.  PROCEDURE: 1. Partial closure of abdomen with VAC dressing. 2. Revision of colostomy.  SURGEON:  Marta Lamas. Lindie Spruce, MD  ASSISTANT:  Earney Hamburg, PA  ANESTHESIA:  General endotracheal.  ESTIMATED BLOOD LOSS:  Less than 20 mL.  COMPLICATIONS:  None.  CONDITION:  Stable.  FINDINGS:  The upper medial portion of the colostomy was partially necrotic with mucosa necrosis.  There is also some inferior lateral separation of the stoma from the skin edge.  OPERATION:  The patient was taken to the operating room and placed on table in supine position.  He has a tracheostomy in place.  Once we got an inhalation anesthetic, proper time-out was performed identifying the patient and procedure to be done.  We went ahead and prepped and draped in usual sterile manner.  Betadine prep was performed.  This included the colostomy.  We removed the center portion of the vacuum dressing.  This revealed what appeared to be just normal omentum, no evidence of any purulent drainage and necrosis.  We removed it from the subcutaneous tissues laterally.  There was white foam in the flap inferiorly, which we removed with three pieces placed previously and all three pieces were removed at this time.  We washed the abdomen out with saline solution.  Once we had done so, we went ahead and placed some simple stitches in the inferior and also superior aspect of the  wound.  Approximately, five simple stitches were placed inferiorly and cranially approximately four were placed.  This reapproximated the fascia in those areas.  However, the central portion remained open with omentum in the center.  We placed Mepitel dressing over that and a black foam for the vacuum dressing in the center part. We also included a piece of vacuum dressing in the upper portion where the fascia had been reapposed.  Once that was in place, we covered it a with sterile towels while I revised the stoma on the right side cutting out the mucosal necrosis, and I am using 3-0 Vicryl stitches to reattach the separated portion of the stoma inferolaterally.  Once that was done, we went ahead, and I changed my gloves as a surgeon, placed a plastic on top of the black foam apposing it in all places indicating a good seal with the vacuum machine.  Once these were closed, we placed a stoma bag on the stoma on the right side and then in stable condition, the patient was brought back to the ICU.  All counts were correct including needles, sponges, and instruments.  Cherylynn Ridges, M.D.     JOW/MEDQ  D:  03/26/2010  T:  03/27/2010  Job:  [id number]  Electronically Signed by Jimmye Norman M.D. on 03/29/2010 02:49:51 PM

## 2010-03-30 ENCOUNTER — Inpatient Hospital Stay (HOSPITAL_COMMUNITY): Payer: BLUE CROSS/BLUE SHIELD

## 2010-03-30 LAB — RENAL FUNCTION PANEL
Albumin: 1.2 g/dL — ABNORMAL LOW (ref 3.5–5.2)
BUN: 37 mg/dL — ABNORMAL HIGH (ref 6–23)
CO2: 24 mEq/L (ref 19–32)
Calcium: 8.2 mg/dL — ABNORMAL LOW (ref 8.4–10.5)
Chloride: 108 mEq/L (ref 96–112)
Creatinine, Ser: 2.78 mg/dL — ABNORMAL HIGH (ref 0.4–1.5)
GFR calc Af Amer: 31 mL/min — ABNORMAL LOW (ref >60)
GFR calc non Af Amer: 25 mL/min — ABNORMAL LOW (ref >60)
Phosphorus: 3.3 mg/dL (ref 2.3–4.6)
Sodium: 135 mEq/L (ref 135–145)

## 2010-03-30 LAB — CBC
Hemoglobin: 8.4 g/dL — ABNORMAL LOW (ref 13.0–17.0)
MCH: 28.3 pg (ref 26.0–34.0)
MCHC: 31.6 g/dL (ref 30.0–36.0)
Platelets: 305 10*3/uL (ref 150–400)

## 2010-03-30 LAB — GLUCOSE, CAPILLARY
Glucose-Capillary: 78 mg/dL (ref 70–99)
Glucose-Capillary: 82 mg/dL (ref 70–99)

## 2010-03-30 LAB — CULTURE, RESPIRATORY W GRAM STAIN

## 2010-03-30 LAB — MAGNESIUM: Magnesium: 2.5 mg/dL (ref 1.5–2.5)

## 2010-03-31 ENCOUNTER — Inpatient Hospital Stay (HOSPITAL_COMMUNITY): Payer: BLUE CROSS/BLUE SHIELD

## 2010-03-31 LAB — BASIC METABOLIC PANEL
BUN: 45 mg/dL — ABNORMAL HIGH (ref 6–23)
Calcium: 8.5 mg/dL (ref 8.4–10.5)
GFR calc non Af Amer: 17 mL/min — ABNORMAL LOW (ref >60)
Glucose, Bld: 71 mg/dL (ref 70–99)

## 2010-03-31 LAB — CBC
HCT: 26.4 % — ABNORMAL LOW (ref 39.0–52.0)
MCHC: 31.1 g/dL (ref 30.0–36.0)
MCV: 91.7 fL (ref 78.0–100.0)
Platelets: 447 10*3/uL — ABNORMAL HIGH (ref 150–400)
RDW: 19.2 % — ABNORMAL HIGH (ref 11.5–15.5)

## 2010-03-31 LAB — MAGNESIUM: Magnesium: 2.5 mg/dL (ref 1.5–2.5)

## 2010-04-01 ENCOUNTER — Inpatient Hospital Stay (HOSPITAL_COMMUNITY): Payer: BLUE CROSS/BLUE SHIELD

## 2010-04-01 DIAGNOSIS — S36509A Unspecified injury of unspecified part of colon, initial encounter: Secondary | ICD-10-CM

## 2010-04-01 DIAGNOSIS — S2239XA Fracture of one rib, unspecified side, initial encounter for closed fracture: Secondary | ICD-10-CM

## 2010-04-01 DIAGNOSIS — J9600 Acute respiratory failure, unspecified whether with hypoxia or hypercapnia: Secondary | ICD-10-CM

## 2010-04-01 LAB — RENAL FUNCTION PANEL
Albumin: 1.4 g/dL — ABNORMAL LOW (ref 3.5–5.2)
BUN: 31 mg/dL — ABNORMAL HIGH (ref 6–23)
CO2: 25 mEq/L (ref 19–32)
Chloride: 107 mEq/L (ref 96–112)
Glucose, Bld: 100 mg/dL — ABNORMAL HIGH (ref 70–99)
Potassium: 3.6 mEq/L (ref 3.5–5.1)

## 2010-04-01 LAB — GLUCOSE, CAPILLARY
Glucose-Capillary: 108 mg/dL — ABNORMAL HIGH (ref 70–99)
Glucose-Capillary: 106 mg/dL — ABNORMAL HIGH (ref 70–99)
Glucose-Capillary: 113 mg/dL — ABNORMAL HIGH (ref 70–99)

## 2010-04-01 LAB — CARDIAC PANEL(CRET KIN+CKTOT+MB+TROPI)
CK, MB: 5 ng/mL — ABNORMAL HIGH (ref 0.3–4.0)
Relative Index: INVALID (ref 0.0–2.5)
Total CK: 38 U/L (ref 7–232)

## 2010-04-01 LAB — CBC
HCT: 26.5 % — ABNORMAL LOW (ref 39.0–52.0)
Hemoglobin: 8.5 g/dL — ABNORMAL LOW (ref 13.0–17.0)
MCV: 94 fL (ref 78.0–100.0)
RBC: 2.82 MIL/uL — ABNORMAL LOW (ref 4.22–5.81)
WBC: 3.7 10*3/uL — ABNORMAL LOW (ref 4.0–10.5)

## 2010-04-02 ENCOUNTER — Inpatient Hospital Stay (HOSPITAL_COMMUNITY): Payer: BLUE CROSS/BLUE SHIELD

## 2010-04-02 LAB — CBC
HCT: 27.8 % — ABNORMAL LOW (ref 39.0–52.0)
Hemoglobin: 8.8 g/dL — ABNORMAL LOW (ref 13.0–17.0)
MCV: 94.9 fL (ref 78.0–100.0)
RDW: 19.9 % — ABNORMAL HIGH (ref 11.5–15.5)
WBC: 4.4 10*3/uL (ref 4.0–10.5)

## 2010-04-02 LAB — GLUCOSE, CAPILLARY
Glucose-Capillary: 87 mg/dL (ref 70–99)
Glucose-Capillary: 98 mg/dL (ref 70–99)

## 2010-04-02 LAB — CROSSMATCH
ABO/RH(D): A NEG
DAT, IgG: POSITIVE
Donor AG Type: NEGATIVE
Donor AG Type: NEGATIVE
Donor AG Type: NEGATIVE
Unit division: 0
Unit division: 0
Unit division: 0
Unit division: 0
Unit division: 0

## 2010-04-02 LAB — RENAL FUNCTION PANEL
BUN: 62 mg/dL — ABNORMAL HIGH (ref 6–23)
CO2: 20 mEq/L (ref 19–32)
Chloride: 107 mEq/L (ref 96–112)
Glucose, Bld: 89 mg/dL (ref 70–99)
Potassium: 4 mEq/L (ref 3.5–5.1)
Sodium: 137 mEq/L (ref 135–145)
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
## 2010-04-03 ENCOUNTER — Inpatient Hospital Stay (HOSPITAL_COMMUNITY): Payer: BLUE CROSS/BLUE SHIELD

## 2010-04-03 DIAGNOSIS — L97509 Non-pressure chronic ulcer of other part of unspecified foot with unspecified severity: Secondary | ICD-10-CM

## 2010-04-03 DIAGNOSIS — I4891 Unspecified atrial fibrillation: Secondary | ICD-10-CM

## 2010-04-03 LAB — GLUCOSE, CAPILLARY
Glucose-Capillary: 104 mg/dL — ABNORMAL HIGH (ref 70–99)
Glucose-Capillary: 110 mg/dL — ABNORMAL HIGH (ref 70–99)

## 2010-04-03 LAB — CBC
HCT: 24.5 % — ABNORMAL LOW (ref 39.0–52.0)
MCHC: 30.6 g/dL (ref 30.0–36.0)
MCV: 95.3 fL (ref 78.0–100.0)
RDW: 19.2 % — ABNORMAL HIGH (ref 11.5–15.5)

## 2010-04-03 LAB — RENAL FUNCTION PANEL
BUN: 50 mg/dL — ABNORMAL HIGH (ref 6–23)
Calcium: 8.6 mg/dL (ref 8.4–10.5)
Creatinine, Ser: 5.48 mg/dL — ABNORMAL HIGH (ref 0.4–1.5)
Glucose, Bld: 102 mg/dL — ABNORMAL HIGH (ref 70–99)
Phosphorus: 5.8 mg/dL — ABNORMAL HIGH (ref 2.3–4.6)

## 2010-04-04 ENCOUNTER — Inpatient Hospital Stay (HOSPITAL_COMMUNITY): Payer: BLUE CROSS/BLUE SHIELD

## 2010-04-04 LAB — GLUCOSE, CAPILLARY
Glucose-Capillary: 77 mg/dL (ref 70–99)
Glucose-Capillary: 80 mg/dL (ref 70–99)
Glucose-Capillary: 84 mg/dL (ref 70–99)
Glucose-Capillary: 87 mg/dL (ref 70–99)
Glucose-Capillary: 89 mg/dL (ref 70–99)
Glucose-Capillary: 89 mg/dL (ref 70–99)

## 2010-04-04 LAB — CULTURE, RESPIRATORY W GRAM STAIN
Culture: NO GROWTH
Gram Stain: NONE SEEN

## 2010-04-04 LAB — HEPATIC FUNCTION PANEL
ALT: 67 U/L — ABNORMAL HIGH (ref 0–53)
AST: 66 U/L — ABNORMAL HIGH (ref 0–37)
Indirect Bilirubin: 1.6 mg/dL — ABNORMAL HIGH (ref 0.3–0.9)
Total Protein: 6.8 g/dL (ref 6.0–8.3)

## 2010-04-04 LAB — CARDIAC PANEL(CRET KIN+CKTOT+MB+TROPI)
CK, MB: 3 ng/mL (ref 0.3–4.0)
Relative Index: INVALID (ref 0.0–2.5)
Total CK: 26 U/L (ref 7–232)
Troponin I: 0.03 ng/mL (ref 0.00–0.06)

## 2010-04-04 LAB — RENAL FUNCTION PANEL
Albumin: 1.6 g/dL — ABNORMAL LOW (ref 3.5–5.2)
BUN: 84 mg/dL — ABNORMAL HIGH (ref 6–23)
Chloride: 104 mEq/L (ref 96–112)
Glucose, Bld: 82 mg/dL (ref 70–99)
Potassium: 3.6 mEq/L (ref 3.5–5.1)
Sodium: 135 mEq/L (ref 135–145)

## 2010-04-04 LAB — CBC
Hemoglobin: 7.8 g/dL — ABNORMAL LOW (ref 13.0–17.0)
Platelets: 734 10*3/uL — ABNORMAL HIGH (ref 150–400)
RBC: 2.67 MIL/uL — ABNORMAL LOW (ref 4.22–5.81)

## 2010-04-04 LAB — BASIC METABOLIC PANEL
BUN: 71 mg/dL — ABNORMAL HIGH (ref 6–23)
Chloride: 104 mEq/L (ref 96–112)
GFR calc Af Amer: 10 mL/min — ABNORMAL LOW (ref >60)
Potassium: 3.5 mEq/L (ref 3.5–5.1)
Sodium: 133 mEq/L — ABNORMAL LOW (ref 135–145)

## 2010-04-05 ENCOUNTER — Inpatient Hospital Stay (HOSPITAL_COMMUNITY): Payer: BLUE CROSS/BLUE SHIELD

## 2010-04-05 LAB — GLUCOSE, CAPILLARY
Glucose-Capillary: 83 mg/dL (ref 70–99)
Glucose-Capillary: 97 mg/dL (ref 70–99)

## 2010-04-05 LAB — CBC
Hemoglobin: 8.9 g/dL — ABNORMAL LOW (ref 13.0–17.0)
MCH: 29.1 pg (ref 26.0–34.0)
RBC: 3.06 MIL/uL — ABNORMAL LOW (ref 4.22–5.81)

## 2010-04-05 LAB — BASIC METABOLIC PANEL
CO2: 17 mEq/L — ABNORMAL LOW (ref 19–32)
Chloride: 103 mEq/L (ref 96–112)
GFR calc Af Amer: 7 mL/min — ABNORMAL LOW (ref >60)
Potassium: 3.5 mEq/L (ref 3.5–5.1)
Sodium: 135 mEq/L (ref 135–145)

## 2010-04-05 LAB — HEPATIC FUNCTION PANEL
ALT: 70 U/L — ABNORMAL HIGH (ref 0–53)
AST: 80 U/L — ABNORMAL HIGH (ref 0–37)
Albumin: 1.4 g/dL — ABNORMAL LOW (ref 3.5–5.2)
Alkaline Phosphatase: 143 U/L — ABNORMAL HIGH (ref 39–117)
Total Bilirubin: 2.8 mg/dL — ABNORMAL HIGH (ref 0.3–1.2)

## 2010-04-05 LAB — HEMOCCULT GUIAC POC 1CARD (OFFICE)
Fecal Occult Bld: NEGATIVE
Fecal Occult Bld: NEGATIVE

## 2010-04-05 LAB — CATH TIP CULTURE

## 2010-04-06 ENCOUNTER — Inpatient Hospital Stay (HOSPITAL_COMMUNITY): Payer: BLUE CROSS/BLUE SHIELD

## 2010-04-06 ENCOUNTER — Other Ambulatory Visit: Payer: Self-pay | Admitting: Emergency Medicine

## 2010-04-06 LAB — CBC
HCT: 28.7 % — ABNORMAL LOW (ref 39.0–52.0)
Hemoglobin: 9 g/dL — ABNORMAL LOW (ref 13.0–17.0)
MCH: 29 pg (ref 26.0–34.0)
MCHC: 31.4 g/dL (ref 30.0–36.0)
MCV: 92.6 fL (ref 78.0–100.0)
Platelets: 576 10*3/uL — ABNORMAL HIGH (ref 150–400)
RBC: 3.1 MIL/uL — ABNORMAL LOW (ref 4.22–5.81)
RDW: 18.3 % — ABNORMAL HIGH (ref 11.5–15.5)
WBC: 23.6 10*3/uL — ABNORMAL HIGH (ref 4.0–10.5)

## 2010-04-06 LAB — BASIC METABOLIC PANEL WITH GFR
BUN: 64 mg/dL — ABNORMAL HIGH (ref 6–23)
CO2: 23 meq/L (ref 19–32)
Calcium: 8.4 mg/dL (ref 8.4–10.5)
Chloride: 102 meq/L (ref 96–112)
Creatinine, Ser: 6.8 mg/dL — ABNORMAL HIGH (ref 0.4–1.5)
GFR calc non Af Amer: 8 mL/min — ABNORMAL LOW
Glucose, Bld: 85 mg/dL (ref 70–99)
Potassium: 3.4 meq/L — ABNORMAL LOW (ref 3.5–5.1)
Sodium: 136 meq/L (ref 135–145)

## 2010-04-06 LAB — GLUCOSE, CAPILLARY
Glucose-Capillary: 78 mg/dL (ref 70–99)
Glucose-Capillary: 105 mg/dL — ABNORMAL HIGH (ref 70–99)
Glucose-Capillary: 69 mg/dL — ABNORMAL LOW (ref 70–99)
Glucose-Capillary: 67 mg/dL — ABNORMAL LOW (ref 70–99)

## 2010-04-06 LAB — HEMOCCULT GUIAC POC 1CARD (OFFICE): Fecal Occult Bld: POSITIVE

## 2010-04-07 ENCOUNTER — Inpatient Hospital Stay (HOSPITAL_COMMUNITY): Payer: BLUE CROSS/BLUE SHIELD

## 2010-04-07 LAB — GLUCOSE, CAPILLARY
Glucose-Capillary: 77 mg/dL (ref 70–99)
Glucose-Capillary: 100 mg/dL — ABNORMAL HIGH (ref 70–99)
Glucose-Capillary: 89 mg/dL (ref 70–99)

## 2010-04-07 LAB — CROSSMATCH
ABO/RH(D): A NEG
Donor AG Type: NEGATIVE

## 2010-04-07 LAB — RENAL FUNCTION PANEL
BUN: 81 mg/dL — ABNORMAL HIGH (ref 6–23)
CO2: 21 mEq/L (ref 19–32)
Calcium: 8.5 mg/dL (ref 8.4–10.5)
Glucose, Bld: 72 mg/dL (ref 70–99)
Phosphorus: 7.5 mg/dL — ABNORMAL HIGH (ref 2.3–4.6)
Potassium: 3.5 mEq/L (ref 3.5–5.1)

## 2010-04-07 LAB — CBC
HCT: 29.8 % — ABNORMAL LOW (ref 39.0–52.0)
Hemoglobin: 9.3 g/dL — ABNORMAL LOW (ref 13.0–17.0)
MCHC: 31.2 g/dL (ref 30.0–36.0)
MCV: 93.4 fL (ref 78.0–100.0)
RDW: 18.1 % — ABNORMAL HIGH (ref 11.5–15.5)

## 2010-04-08 ENCOUNTER — Inpatient Hospital Stay (HOSPITAL_COMMUNITY): Payer: BLUE CROSS/BLUE SHIELD

## 2010-04-08 LAB — RENAL FUNCTION PANEL
BUN: 41 mg/dL — ABNORMAL HIGH (ref 6–23)
CO2: 23 mEq/L (ref 19–32)
Calcium: 8.1 mg/dL — ABNORMAL LOW (ref 8.4–10.5)
Chloride: 104 mEq/L (ref 96–112)
Creatinine, Ser: 6.03 mg/dL — ABNORMAL HIGH (ref 0.4–1.5)
GFR calc non Af Amer: 10 mL/min — ABNORMAL LOW (ref >60)
Glucose, Bld: 89 mg/dL (ref 70–99)

## 2010-04-08 LAB — GLUCOSE, CAPILLARY: Glucose-Capillary: 98 mg/dL (ref 70–99)

## 2010-04-08 LAB — CBC
HCT: 31.1 % — ABNORMAL LOW (ref 39.0–52.0)
Hemoglobin: 9.8 g/dL — ABNORMAL LOW (ref 13.0–17.0)
MCH: 29.1 pg (ref 26.0–34.0)
MCHC: 31.5 g/dL (ref 30.0–36.0)
MCV: 92.3 fL (ref 78.0–100.0)
RBC: 3.37 MIL/uL — ABNORMAL LOW (ref 4.22–5.81)

## 2010-04-08 LAB — CULTURE, BLOOD (ROUTINE X 2)
Culture  Setup Time: 201203212200
Culture: NO GROWTH

## 2010-04-09 ENCOUNTER — Inpatient Hospital Stay (HOSPITAL_COMMUNITY): Payer: BLUE CROSS/BLUE SHIELD

## 2010-04-09 DIAGNOSIS — D72829 Elevated white blood cell count, unspecified: Secondary | ICD-10-CM

## 2010-04-09 DIAGNOSIS — M79609 Pain in unspecified limb: Secondary | ICD-10-CM

## 2010-04-09 DIAGNOSIS — R197 Diarrhea, unspecified: Secondary | ICD-10-CM

## 2010-04-09 LAB — BASIC METABOLIC PANEL
BUN: 52 mg/dL — ABNORMAL HIGH (ref 6–23)
Creatinine, Ser: 7.6 mg/dL — ABNORMAL HIGH (ref 0.4–1.5)
GFR calc Af Amer: 9 mL/min — ABNORMAL LOW (ref >60)
GFR calc non Af Amer: 7 mL/min — ABNORMAL LOW (ref >60)
Potassium: 3.7 mEq/L (ref 3.5–5.1)

## 2010-04-09 LAB — CBC
MCH: 29.2 pg (ref 26.0–34.0)
MCHC: 31.6 g/dL (ref 30.0–36.0)
MCV: 93.8 fL (ref 78.0–100.0)
Platelets: 450 10*3/uL — ABNORMAL HIGH (ref 150–400)
RDW: 18.4 % — ABNORMAL HIGH (ref 11.5–15.5)
RDW: 18.2 % — ABNORMAL HIGH (ref 11.5–15.5)
WBC: 32.8 10*3/uL — ABNORMAL HIGH (ref 4.0–10.5)
WBC: 32.4 10*3/uL — ABNORMAL HIGH (ref 4.0–10.5)

## 2010-04-09 LAB — GLUCOSE, CAPILLARY
Glucose-Capillary: 94 mg/dL (ref 70–99)
Glucose-Capillary: 79 mg/dL (ref 70–99)
Glucose-Capillary: 81 mg/dL (ref 70–99)

## 2010-04-09 LAB — CLOSTRIDIUM DIFFICILE BY PCR: Toxigenic C. Difficile by PCR: NEGATIVE

## 2010-04-10 ENCOUNTER — Inpatient Hospital Stay (HOSPITAL_COMMUNITY): Payer: BLUE CROSS/BLUE SHIELD

## 2010-04-10 ENCOUNTER — Other Ambulatory Visit: Payer: Self-pay | Admitting: Internal Medicine

## 2010-04-10 DIAGNOSIS — I808 Phlebitis and thrombophlebitis of other sites: Secondary | ICD-10-CM

## 2010-04-10 LAB — CBC
HCT: 31.2 % — ABNORMAL LOW (ref 39.0–52.0)
Hemoglobin: 9.7 g/dL — ABNORMAL LOW (ref 13.0–17.0)
RBC: 3.38 MIL/uL — ABNORMAL LOW (ref 4.22–5.81)
RDW: 17.8 % — ABNORMAL HIGH (ref 11.5–15.5)
WBC: 32.6 10*3/uL — ABNORMAL HIGH (ref 4.0–10.5)

## 2010-04-10 LAB — RENAL FUNCTION PANEL
Chloride: 97 mEq/L (ref 96–112)
Glucose, Bld: 69 mg/dL — ABNORMAL LOW (ref 70–99)
Phosphorus: 5.2 mg/dL — ABNORMAL HIGH (ref 2.3–4.6)
Potassium: 3.9 mEq/L (ref 3.5–5.1)
Sodium: 136 mEq/L (ref 135–145)

## 2010-04-10 LAB — CULTURE, RESPIRATORY W GRAM STAIN

## 2010-04-10 LAB — GLUCOSE, CAPILLARY
Glucose-Capillary: 83 mg/dL (ref 70–99)
Glucose-Capillary: 86 mg/dL (ref 70–99)
Glucose-Capillary: 90 mg/dL (ref 70–99)

## 2010-04-10 LAB — DIFFERENTIAL
Basophils Absolute: 0.3 10*3/uL — ABNORMAL HIGH (ref 0.0–0.1)
Lymphocytes Relative: 14 % (ref 12–46)
Lymphs Abs: 4.6 10*3/uL — ABNORMAL HIGH (ref 0.7–4.0)
Monocytes Relative: 4 % (ref 3–12)
Neutrophils Relative %: 78 % — ABNORMAL HIGH (ref 43–77)

## 2010-04-10 LAB — TECHNOLOGIST SMEAR REVIEW

## 2010-04-11 ENCOUNTER — Inpatient Hospital Stay (HOSPITAL_COMMUNITY): Payer: BLUE CROSS/BLUE SHIELD

## 2010-04-11 LAB — CBC
HCT: 29.6 % — ABNORMAL LOW (ref 39.0–52.0)
MCHC: 31.4 g/dL (ref 30.0–36.0)
MCV: 93.7 fL (ref 78.0–100.0)
RDW: 17.6 % — ABNORMAL HIGH (ref 11.5–15.5)

## 2010-04-11 LAB — GLUCOSE, CAPILLARY
Glucose-Capillary: 100 mg/dL — ABNORMAL HIGH (ref 70–99)
Glucose-Capillary: 81 mg/dL (ref 70–99)

## 2010-04-11 LAB — RENAL FUNCTION PANEL
BUN: 43 mg/dL — ABNORMAL HIGH (ref 6–23)
Glucose, Bld: 72 mg/dL (ref 70–99)
Phosphorus: 6.3 mg/dL — ABNORMAL HIGH (ref 2.3–4.6)
Potassium: 3.8 mEq/L (ref 3.5–5.1)

## 2010-04-11 LAB — PATHOLOGIST SMEAR REVIEW

## 2010-04-12 ENCOUNTER — Inpatient Hospital Stay (HOSPITAL_COMMUNITY): Payer: BLUE CROSS/BLUE SHIELD

## 2010-04-12 LAB — GLUCOSE, CAPILLARY
Glucose-Capillary: 93 mg/dL (ref 70–99)
Glucose-Capillary: 79 mg/dL (ref 70–99)

## 2010-04-12 LAB — CBC
MCH: 29.3 pg (ref 26.0–34.0)
MCHC: 31.2 g/dL (ref 30.0–36.0)
Platelets: 317 10*3/uL (ref 150–400)
RBC: 3.21 MIL/uL — ABNORMAL LOW (ref 4.22–5.81)
RDW: 17.5 % — ABNORMAL HIGH (ref 11.5–15.5)

## 2010-04-12 LAB — RENAL FUNCTION PANEL
Albumin: 1.7 g/dL — ABNORMAL LOW (ref 3.5–5.2)
Calcium: 8.4 mg/dL (ref 8.4–10.5)
Creatinine, Ser: 4.46 mg/dL — ABNORMAL HIGH (ref 0.4–1.5)
GFR calc Af Amer: 16 mL/min — ABNORMAL LOW (ref >60)
GFR calc non Af Amer: 14 mL/min — ABNORMAL LOW (ref >60)

## 2010-04-12 LAB — CULTURE, BLOOD (ROUTINE X 2)
Culture  Setup Time: 201203251728
Culture  Setup Time: 201203251728
Culture: NO GROWTH

## 2010-04-13 ENCOUNTER — Inpatient Hospital Stay (HOSPITAL_COMMUNITY): Payer: BLUE CROSS/BLUE SHIELD

## 2010-04-13 LAB — CBC
Hemoglobin: 9.4 g/dL — ABNORMAL LOW (ref 13.0–17.0)
MCH: 28.9 pg (ref 26.0–34.0)
MCHC: 30.7 g/dL (ref 30.0–36.0)
Platelets: 314 10*3/uL (ref 150–400)
RDW: 17.7 % — ABNORMAL HIGH (ref 11.5–15.5)

## 2010-04-13 LAB — RENAL FUNCTION PANEL
CO2: 24 mEq/L (ref 19–32)
GFR calc Af Amer: 10 mL/min — ABNORMAL LOW (ref >60)
GFR calc non Af Amer: 9 mL/min — ABNORMAL LOW (ref >60)
Glucose, Bld: 85 mg/dL (ref 70–99)
Phosphorus: 6 mg/dL — ABNORMAL HIGH (ref 2.3–4.6)
Potassium: 3.6 mEq/L (ref 3.5–5.1)
Sodium: 131 mEq/L — ABNORMAL LOW (ref 135–145)

## 2010-04-13 LAB — DIFFERENTIAL
Basophils Absolute: 0.3 10*3/uL — ABNORMAL HIGH (ref 0.0–0.1)
Eosinophils Relative: 3 % (ref 0–5)
Lymphs Abs: 4.7 10*3/uL — ABNORMAL HIGH (ref 0.7–4.0)
Monocytes Absolute: 0.9 10*3/uL (ref 0.1–1.0)
Neutrophils Relative %: 77 % (ref 43–77)
Smear Review: ADEQUATE

## 2010-04-13 LAB — GLUCOSE, CAPILLARY
Glucose-Capillary: 98 mg/dL (ref 70–99)
Glucose-Capillary: 104 mg/dL — ABNORMAL HIGH (ref 70–99)

## 2010-04-14 ENCOUNTER — Inpatient Hospital Stay (HOSPITAL_COMMUNITY): Payer: BLUE CROSS/BLUE SHIELD

## 2010-04-14 LAB — COMPREHENSIVE METABOLIC PANEL
Albumin: 1.8 g/dL — ABNORMAL LOW (ref 3.5–5.2)
BUN: 47 mg/dL — ABNORMAL HIGH (ref 6–23)
Creatinine, Ser: 8.28 mg/dL — ABNORMAL HIGH (ref 0.4–1.5)
Glucose, Bld: 81 mg/dL (ref 70–99)
Total Bilirubin: 1.6 mg/dL — ABNORMAL HIGH (ref 0.3–1.2)
Total Protein: 6.6 g/dL (ref 6.0–8.3)

## 2010-04-14 LAB — IRON AND TIBC
Iron: 42 ug/dL (ref 42–135)
Saturation Ratios: 23 % (ref 20–55)
UIBC: 138 ug/dL

## 2010-04-14 LAB — CBC
HCT: 29.1 % — ABNORMAL LOW (ref 39.0–52.0)
MCH: 29.7 pg (ref 26.0–34.0)
MCV: 93.9 fL (ref 78.0–100.0)
Platelets: 328 10*3/uL (ref 150–400)
RDW: 18 % — ABNORMAL HIGH (ref 11.5–15.5)

## 2010-04-14 LAB — PATHOLOGIST SMEAR REVIEW

## 2010-04-14 LAB — GLUCOSE, CAPILLARY
Glucose-Capillary: 94 mg/dL (ref 70–99)
Glucose-Capillary: 90 mg/dL (ref 70–99)

## 2010-04-14 LAB — PHOSPHORUS: Phosphorus: 6.9 mg/dL — ABNORMAL HIGH (ref 2.3–4.6)

## 2010-04-15 LAB — GLUCOSE, CAPILLARY
Glucose-Capillary: 78 mg/dL (ref 70–99)
Glucose-Capillary: 90 mg/dL (ref 70–99)
Glucose-Capillary: 95 mg/dL (ref 70–99)

## 2010-04-16 ENCOUNTER — Inpatient Hospital Stay (HOSPITAL_COMMUNITY): Payer: BLUE CROSS/BLUE SHIELD

## 2010-04-16 LAB — CBC
HCT: 31.3 % — ABNORMAL LOW (ref 39.0–52.0)
Hemoglobin: 9.8 g/dL — ABNORMAL LOW (ref 13.0–17.0)
MCH: 29.3 pg (ref 26.0–34.0)
MCHC: 31.3 g/dL (ref 30.0–36.0)
MCV: 93.4 fL (ref 78.0–100.0)
Platelets: 373 10*3/uL (ref 150–400)
RBC: 3.35 MIL/uL — ABNORMAL LOW (ref 4.22–5.81)
RDW: 18.2 % — ABNORMAL HIGH (ref 11.5–15.5)
WBC: 24.9 10*3/uL — ABNORMAL HIGH (ref 4.0–10.5)

## 2010-04-16 LAB — GLUCOSE, CAPILLARY
Glucose-Capillary: 71 mg/dL (ref 70–99)
Glucose-Capillary: 77 mg/dL (ref 70–99)
Glucose-Capillary: 86 mg/dL (ref 70–99)
Glucose-Capillary: 87 mg/dL (ref 70–99)
Glucose-Capillary: 89 mg/dL (ref 70–99)
Glucose-Capillary: 90 mg/dL (ref 70–99)
Glucose-Capillary: 91 mg/dL (ref 70–99)

## 2010-04-16 LAB — BASIC METABOLIC PANEL
BUN: 44 mg/dL — ABNORMAL HIGH (ref 6–23)
CO2: 25 mEq/L (ref 19–32)
Calcium: 9.1 mg/dL (ref 8.4–10.5)
Chloride: 96 mEq/L (ref 96–112)
Creatinine, Ser: 8.03 mg/dL — ABNORMAL HIGH (ref 0.4–1.5)
GFR calc Af Amer: 8 mL/min — ABNORMAL LOW (ref >60)
GFR calc non Af Amer: 7 mL/min — ABNORMAL LOW (ref >60)
Glucose, Bld: 78 mg/dL (ref 70–99)
Potassium: 4.1 mEq/L (ref 3.5–5.1)
Sodium: 132 mEq/L — ABNORMAL LOW (ref 135–145)

## 2010-04-17 HISTORY — PX: OTHER SURGICAL HISTORY: SHX169

## 2010-04-17 LAB — GLUCOSE, CAPILLARY: Glucose-Capillary: 79 mg/dL (ref 70–99)

## 2010-04-18 ENCOUNTER — Inpatient Hospital Stay (HOSPITAL_COMMUNITY): Payer: BLUE CROSS/BLUE SHIELD

## 2010-04-18 LAB — GLUCOSE, CAPILLARY
Glucose-Capillary: 105 mg/dL — ABNORMAL HIGH (ref 70–99)
Glucose-Capillary: 93 mg/dL (ref 70–99)

## 2010-04-18 LAB — POCT I-STAT 4, (NA,K, GLUC, HGB,HCT)
Hemoglobin: 10.9 g/dL — ABNORMAL LOW (ref 13.0–17.0)
Potassium: 3.5 meq/L (ref 3.5–5.1)
Sodium: 133 meq/L — ABNORMAL LOW (ref 135–145)

## 2010-04-19 LAB — GLUCOSE, CAPILLARY
Glucose-Capillary: 91 mg/dL (ref 70–99)
Glucose-Capillary: 113 mg/dL — ABNORMAL HIGH (ref 70–99)

## 2010-04-20 LAB — GLUCOSE, CAPILLARY

## 2010-04-21 ENCOUNTER — Inpatient Hospital Stay (HOSPITAL_COMMUNITY): Payer: BLUE CROSS/BLUE SHIELD

## 2010-04-21 LAB — COMPREHENSIVE METABOLIC PANEL
AST: 38 U/L — ABNORMAL HIGH (ref 0–37)
Albumin: 2 g/dL — ABNORMAL LOW (ref 3.5–5.2)
BUN: 56 mg/dL — ABNORMAL HIGH (ref 6–23)
CO2: 19 mEq/L (ref 19–32)
Calcium: 9.1 mg/dL (ref 8.4–10.5)
Creatinine, Ser: 9.52 mg/dL — ABNORMAL HIGH (ref 0.4–1.5)
GFR calc Af Amer: 7 mL/min — ABNORMAL LOW (ref >60)
GFR calc non Af Amer: 6 mL/min — ABNORMAL LOW (ref >60)

## 2010-04-21 LAB — GLUCOSE, CAPILLARY
Glucose-Capillary: 100 mg/dL — ABNORMAL HIGH (ref 70–99)
Glucose-Capillary: 91 mg/dL (ref 70–99)
Glucose-Capillary: 84 mg/dL (ref 70–99)

## 2010-04-21 LAB — PREALBUMIN: Prealbumin: 28 mg/dL (ref 17.0–34.0)

## 2010-04-21 LAB — PHOSPHORUS: Phosphorus: 8.2 mg/dL — ABNORMAL HIGH (ref 2.3–4.6)

## 2010-04-21 LAB — CBC
MCH: 30.2 pg (ref 26.0–34.0)
MCHC: 32.4 g/dL (ref 30.0–36.0)
Platelets: 387 10*3/uL (ref 150–400)

## 2010-04-22 DIAGNOSIS — N186 End stage renal disease: Secondary | ICD-10-CM

## 2010-04-22 DIAGNOSIS — I120 Hypertensive chronic kidney disease with stage 5 chronic kidney disease or end stage renal disease: Secondary | ICD-10-CM

## 2010-04-22 DIAGNOSIS — K029 Dental caries, unspecified: Secondary | ICD-10-CM

## 2010-04-22 DIAGNOSIS — Z463 Encounter for fitting and adjustment of dental prosthetic device: Secondary | ICD-10-CM

## 2010-04-22 DIAGNOSIS — K0530 Chronic periodontitis, unspecified: Secondary | ICD-10-CM

## 2010-04-22 DIAGNOSIS — K08409 Partial loss of teeth, unspecified cause, unspecified class: Secondary | ICD-10-CM

## 2010-04-22 LAB — GLUCOSE, CAPILLARY: Glucose-Capillary: 122 mg/dL — ABNORMAL HIGH (ref 70–99)

## 2010-04-23 ENCOUNTER — Inpatient Hospital Stay (HOSPITAL_COMMUNITY): Payer: BLUE CROSS/BLUE SHIELD

## 2010-04-23 DIAGNOSIS — Z01810 Encounter for preprocedural cardiovascular examination: Secondary | ICD-10-CM

## 2010-04-23 DIAGNOSIS — N19 Unspecified kidney failure: Secondary | ICD-10-CM

## 2010-04-23 LAB — RENAL FUNCTION PANEL
BUN: 58 mg/dL — ABNORMAL HIGH (ref 6–23)
CO2: 24 mEq/L (ref 19–32)
Chloride: 96 mEq/L (ref 96–112)
Creatinine, Ser: 8.62 mg/dL — ABNORMAL HIGH (ref 0.4–1.5)
Glucose, Bld: 84 mg/dL (ref 70–99)
Potassium: 4.1 mEq/L (ref 3.5–5.1)

## 2010-04-23 LAB — CBC
HCT: 30.6 % — ABNORMAL LOW (ref 39.0–52.0)
Hemoglobin: 9.5 g/dL — ABNORMAL LOW (ref 13.0–17.0)
MCH: 28.8 pg (ref 26.0–34.0)
MCV: 92.7 fL (ref 78.0–100.0)
Platelets: 394 10*3/uL (ref 150–400)
RBC: 3.3 MIL/uL — ABNORMAL LOW (ref 4.22–5.81)
WBC: 16.7 10*3/uL — ABNORMAL HIGH (ref 4.0–10.5)

## 2010-04-23 LAB — GLUCOSE, CAPILLARY
Glucose-Capillary: 83 mg/dL (ref 70–99)
Glucose-Capillary: 116 mg/dL — ABNORMAL HIGH (ref 70–99)

## 2010-04-24 ENCOUNTER — Inpatient Hospital Stay (HOSPITAL_COMMUNITY)
Admission: RE | Admit: 2010-04-24 | Discharge: 2010-05-02 | DRG: 462 | Disposition: A | Discharge status: Other Acute Inpatient Hospital | Payer: BC Managed Care – PPO | Source: Ambulatory Visit | Attending: Physical Medicine & Rehabilitation | Admitting: Physical Medicine & Rehabilitation

## 2010-04-24 DIAGNOSIS — Z9889 Other specified postprocedural states: Secondary | ICD-10-CM

## 2010-04-24 DIAGNOSIS — S2249XA Multiple fractures of ribs, unspecified side, initial encounter for closed fracture: Secondary | ICD-10-CM | POA: Diagnosis present

## 2010-04-24 DIAGNOSIS — Z5189 Encounter for other specified aftercare: Principal | ICD-10-CM

## 2010-04-24 DIAGNOSIS — N179 Acute kidney failure, unspecified: Secondary | ICD-10-CM | POA: Diagnosis present

## 2010-04-24 DIAGNOSIS — S06300A Unspecified focal traumatic brain injury without loss of consciousness, initial encounter: Secondary | ICD-10-CM | POA: Diagnosis present

## 2010-04-24 DIAGNOSIS — IMO0001 Reserved for inherently not codable concepts without codable children: Secondary | ICD-10-CM

## 2010-04-24 DIAGNOSIS — S069X9A Unspecified intracranial injury with loss of consciousness of unspecified duration, initial encounter: Secondary | ICD-10-CM

## 2010-04-24 DIAGNOSIS — Z933 Colostomy status: Secondary | ICD-10-CM

## 2010-04-24 DIAGNOSIS — S069XAA Unspecified intracranial injury with loss of consciousness status unknown, initial encounter: Secondary | ICD-10-CM

## 2010-04-24 DIAGNOSIS — D649 Anemia, unspecified: Secondary | ICD-10-CM | POA: Diagnosis present

## 2010-04-24 DIAGNOSIS — Y998 Other external cause status: Secondary | ICD-10-CM

## 2010-04-24 DIAGNOSIS — Z930 Tracheostomy status: Secondary | ICD-10-CM

## 2010-04-24 DIAGNOSIS — S0100XA Unspecified open wound of scalp, initial encounter: Secondary | ICD-10-CM | POA: Diagnosis present

## 2010-04-24 DIAGNOSIS — R5381 Other malaise: Secondary | ICD-10-CM

## 2010-04-24 LAB — GLUCOSE, CAPILLARY: Glucose-Capillary: 80 mg/dL (ref 70–99)

## 2010-04-24 LAB — PTH, INTACT AND CALCIUM: PTH: 38.9 pg/mL (ref 14.0–72.0)
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
## 2010-04-24 NOTE — Op Note (Addendum)
[name], [name]                 ACCOUNT NO.:  [id number]  MEDICAL RECORD NO.:  [id number]           PATIENT TYPE:  I  LOCATION:  2601                         FACILITY:  [facility]  PHYSICIAN:  Cherylynn Ridges, M.D.    DATE OF BIRTH:  12/19/1950  DATE OF PROCEDURE:  04/17/2010 DATE OF DISCHARGE:                              OPERATIVE REPORT   PREOPERATIVE DIAGNOSIS:  Open abdomen with granulating abdominal wound.  POSTOPERATIVE DIAGNOSIS:  Open abdomen with granulating abdominal wound.  PROCEDURES: 1. Split-thickness skin graft to an 80 cm2 area of the abdominal wall. 2. Debridement of wound.  SURGEON:  Cherylynn Ridges, MD  ASSISTANT:  Earney Hamburg, PA-C  ANESTHESIA:  General through a tracheostomy.  ESTIMATED BLOOD LOSS:  Less than 10 mL.  COMPLICATIONS:  None.  CONDITION:  Stable.  FINDINGS:  There was some necrosis of fascia on the left midportion of the wound and also an inferior aspect which was debrided.  Permanent sutures were removed from both areas along with some permanent sutures being removed from the upper midline granulating part of wound.  OPERATION:  The patient was taken to the operating room and placed on table in supine position.  After an adequate endotracheal anesthetic was administered through the patient's tracheostomy tube, he was prepped and draped usual sterile manner exposing his right thigh for the skin graft to be removed and also the abdomen with Betadine.  His stoma bag was removed and covered during most of the case.  We uncovered the patient's previous wound from his previous VAC dressing.  We washed it with saline and sort of scraped with a #10 blade in order to get to bleed.  There was sutures in the upper portion the midline wound which were removed and also some in the lower aspect of the midline wound.  The lower ones were also associated with some necrotic fascia which was debrided sharply with Metzenbaum scissors and also with a  #15 blade.  Care was taken not to injure any underlying structures and none was done.  This area was filled up to subsequently with a cut piece of white foam which was packed into that area to fill in the space.  The left side of the wound in the midportion, there was about a 3-cm circular area which had some necrotic debris and we actually opened up and went down into an area where there was some exposed bowel.  We placed a piece of white foam in that area also as we did for skin graft.  A 3-inch dermatome was used to take skin from the right thigh using mineral oil as a lubricant for the procedure.  We removed approximately 12 cm, 3-inch wide piece x2 from the anteromedial aspect of the thigh. We used a 1 x 1.5 medium mesh device in order to the mesh to the skin graft both pieces.  We then applied them to the wound, securing it laterally using 4-0 Vicryl sutures and also attaching each using a 4-0 Vicryl.  We attached to the sidewalls also, but did not cover the areas where the sponges  were inferiorly or on the left side.  We cut pieces to the appropriate size.  We did a waste about a 10 cm2 piece of skin graft without use as it was extra.  Once the graft was then placed properly and stretched out as it was mesh replacement, put on top of it, cut it to the appropriate size, cut a large black sponge to the appropriate size to fit onto the wound.  We placed a white sponges on the left lateral aspect of the wound and also inferiorly.  Both of these will need to be removed when the [facility] is change in 5-6 days.  Once we had the VAC and black sponge in place, we used plastic dressing to cover over.  We took care not to cover the stoma on the right side of the patient.  This, we actually did go ahead and place a way from prior to placing the plastic.  We cut the plastic around the stomal device in order to be able to change that without interrupting the VAC suction. We attached to the  machine at -125 mmHg.  It sucked down with actually no leakage at all.  It remained in place and looked good.  We did replace the stomal device on around the stoma on the right side.  All needle, sponge counts, and instrument counts were correct.     Cherylynn Ridges, M.D.     JOW/MEDQ  D:  04/17/2010  T:  04/18/2010  Job:  [id number]  Electronically Signed by Jimmye Norman M.D. on 04/24/2010 11:26:27 AM

## 2010-04-25 ENCOUNTER — Ambulatory Visit (HOSPITAL_COMMUNITY): Payer: Self-pay

## 2010-04-25 ENCOUNTER — Inpatient Hospital Stay (HOSPITAL_COMMUNITY): Payer: BC Managed Care – PPO

## 2010-04-25 DIAGNOSIS — N186 End stage renal disease: Secondary | ICD-10-CM

## 2010-04-25 DIAGNOSIS — R5381 Other malaise: Secondary | ICD-10-CM

## 2010-04-25 DIAGNOSIS — S069X9A Unspecified intracranial injury with loss of consciousness of unspecified duration, initial encounter: Secondary | ICD-10-CM

## 2010-04-25 LAB — CBC
MCH: 28.6 pg (ref 26.0–34.0)
MCV: 92.9 fL (ref 78.0–100.0)
Platelets: 418 10*3/uL — ABNORMAL HIGH (ref 150–400)
RBC: 3.22 MIL/uL — ABNORMAL LOW (ref 4.22–5.81)
RDW: 16.9 % — ABNORMAL HIGH (ref 11.5–15.5)
WBC: 16 10*3/uL — ABNORMAL HIGH (ref 4.0–10.5)

## 2010-04-25 LAB — RENAL FUNCTION PANEL
Albumin: 2.1 g/dL — ABNORMAL LOW (ref 3.5–5.2)
BUN: 59 mg/dL — ABNORMAL HIGH (ref 6–23)
Chloride: 99 mEq/L (ref 96–112)
Creatinine, Ser: 8.81 mg/dL — ABNORMAL HIGH (ref 0.4–1.5)
GFR calc Af Amer: 7 mL/min — ABNORMAL LOW (ref >60)
GFR calc non Af Amer: 6 mL/min — ABNORMAL LOW (ref >60)
Phosphorus: 6.8 mg/dL — ABNORMAL HIGH (ref 2.3–4.6)
Potassium: 3.9 mEq/L (ref 3.5–5.1)

## 2010-04-28 ENCOUNTER — Inpatient Hospital Stay (HOSPITAL_COMMUNITY): Payer: BC Managed Care – PPO

## 2010-04-28 LAB — RENAL FUNCTION PANEL
Albumin: 2.4 g/dL — ABNORMAL LOW (ref 3.5–5.2)
BUN: 70 mg/dL — ABNORMAL HIGH (ref 6–23)
Calcium: 9.4 mg/dL (ref 8.4–10.5)
Creatinine, Ser: 10.03 mg/dL — ABNORMAL HIGH (ref 0.4–1.5)
Glucose, Bld: 72 mg/dL (ref 70–99)
Phosphorus: 7.6 mg/dL — ABNORMAL HIGH (ref 2.3–4.6)

## 2010-04-28 LAB — CBC
HCT: 32.5 % — ABNORMAL LOW (ref 39.0–52.0)
MCHC: 31.7 g/dL (ref 30.0–36.0)
MCV: 92.9 fL (ref 78.0–100.0)
Platelets: 524 10*3/uL — ABNORMAL HIGH (ref 150–400)
RDW: 17.5 % — ABNORMAL HIGH (ref 11.5–15.5)
WBC: 15.7 10*3/uL — ABNORMAL HIGH (ref 4.0–10.5)

## 2010-04-30 ENCOUNTER — Inpatient Hospital Stay (HOSPITAL_COMMUNITY): Payer: BC Managed Care – PPO

## 2010-04-30 LAB — CBC
MCH: 29.9 pg (ref 26.0–34.0)
Platelets: 467 10*3/uL — ABNORMAL HIGH (ref 150–400)
RBC: 3.58 MIL/uL — ABNORMAL LOW (ref 4.22–5.81)
WBC: 14.7 10*3/uL — ABNORMAL HIGH (ref 4.0–10.5)

## 2010-04-30 LAB — RENAL FUNCTION PANEL
Albumin: 2.4 g/dL — ABNORMAL LOW (ref 3.5–5.2)
Chloride: 97 mEq/L (ref 96–112)
Creatinine, Ser: 9.04 mg/dL — ABNORMAL HIGH (ref 0.4–1.5)
GFR calc Af Amer: 7 mL/min — ABNORMAL LOW (ref >60)
GFR calc non Af Amer: 6 mL/min — ABNORMAL LOW (ref >60)
Potassium: 4.1 mEq/L (ref 3.5–5.1)
Sodium: 132 mEq/L — ABNORMAL LOW (ref 135–145)

## 2010-04-30 LAB — HEPATITIS B SURFACE ANTIGEN: Hepatitis B Surface Ag: NEGATIVE

## 2010-05-01 LAB — URINE MICROSCOPIC-ADD ON

## 2010-05-01 LAB — URINALYSIS, ROUTINE W REFLEX MICROSCOPIC
Protein, ur: 100 mg/dL — AB
Urobilinogen, UA: 0.2 mg/dL (ref 0.0–1.0)

## 2010-05-02 ENCOUNTER — Inpatient Hospital Stay (HOSPITAL_COMMUNITY): Payer: BC Managed Care – PPO

## 2010-05-02 DIAGNOSIS — R5381 Other malaise: Secondary | ICD-10-CM

## 2010-05-02 DIAGNOSIS — S069X9A Unspecified intracranial injury with loss of consciousness of unspecified duration, initial encounter: Secondary | ICD-10-CM

## 2010-05-02 LAB — RENAL FUNCTION PANEL
Albumin: 2.5 g/dL — ABNORMAL LOW (ref 3.5–5.2)
Calcium: 9.5 mg/dL (ref 8.4–10.5)
Creatinine, Ser: 8.32 mg/dL — ABNORMAL HIGH (ref 0.4–1.5)
GFR calc Af Amer: 8 mL/min — ABNORMAL LOW (ref >60)
GFR calc non Af Amer: 7 mL/min — ABNORMAL LOW (ref >60)

## 2010-05-02 LAB — CBC
MCH: 28.9 pg (ref 26.0–34.0)
MCHC: 31.3 g/dL (ref 30.0–36.0)
Platelets: 458 10*3/uL — ABNORMAL HIGH (ref 150–400)

## 2010-05-02 LAB — URINE CULTURE
Colony Count: NO GROWTH
Culture: NO GROWTH
Special Requests: POSITIVE
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
## 2010-05-15 ENCOUNTER — Ambulatory Visit: Payer: Self-pay | Admitting: Vascular Surgery

## 2010-05-29 ENCOUNTER — Ambulatory Visit: Payer: Self-pay | Admitting: Vascular Surgery

## 2010-06-09 ENCOUNTER — Other Ambulatory Visit: Payer: Self-pay | Admitting: Nephrology

## 2010-06-09 ENCOUNTER — Ambulatory Visit (HOSPITAL_COMMUNITY)
Admission: RE | Admit: 2010-06-09 | Discharge: 2010-06-09 | Disposition: A | Discharge status: Home / Self Care | Payer: BC Managed Care – PPO | Source: Ambulatory Visit | Attending: Nephrology | Admitting: Nephrology

## 2010-06-09 DIAGNOSIS — IMO0002 Reserved for concepts with insufficient information to code with codable children: Secondary | ICD-10-CM

## 2010-06-09 DIAGNOSIS — N186 End stage renal disease: Secondary | ICD-10-CM | POA: Insufficient documentation

## 2010-06-09 DIAGNOSIS — Z452 Encounter for adjustment and management of vascular access device: Secondary | ICD-10-CM | POA: Insufficient documentation

## 2010-06-09 LAB — POCT I-STAT 4, (NA,K, GLUC, HGB,HCT): Hemoglobin: 10.2 g/dL — ABNORMAL LOW (ref 13.0–17.0)
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
## 2010-06-17 ENCOUNTER — Encounter (INDEPENDENT_AMBULATORY_CARE_PROVIDER_SITE_OTHER): Payer: Self-pay | Admitting: General Surgery

## 2010-06-18 ENCOUNTER — Encounter
Payer: BC Managed Care – PPO | Attending: Physical Medicine & Rehabilitation | Admitting: Physical Medicine & Rehabilitation

## 2010-06-18 DIAGNOSIS — R269 Unspecified abnormalities of gait and mobility: Secondary | ICD-10-CM

## 2010-06-18 DIAGNOSIS — S069X9S Unspecified intracranial injury with loss of consciousness of unspecified duration, sequela: Secondary | ICD-10-CM | POA: Insufficient documentation

## 2010-06-18 DIAGNOSIS — S069XAS Unspecified intracranial injury with loss of consciousness status unknown, sequela: Secondary | ICD-10-CM | POA: Insufficient documentation

## 2010-06-18 DIAGNOSIS — S069XAA Unspecified intracranial injury with loss of consciousness status unknown, initial encounter: Secondary | ICD-10-CM

## 2010-06-18 DIAGNOSIS — Z933 Colostomy status: Secondary | ICD-10-CM | POA: Insufficient documentation

## 2010-06-18 DIAGNOSIS — S069X9A Unspecified intracranial injury with loss of consciousness of unspecified duration, initial encounter: Secondary | ICD-10-CM
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
## 2010-07-01 DIAGNOSIS — N186 End stage renal disease: Secondary | ICD-10-CM

## 2010-07-31 ENCOUNTER — Encounter: Payer: Self-pay | Admitting: Vascular Surgery

## 2010-08-19 ENCOUNTER — Encounter: Payer: Self-pay | Admitting: Vascular Surgery

## 2010-08-21 ENCOUNTER — Ambulatory Visit (INDEPENDENT_AMBULATORY_CARE_PROVIDER_SITE_OTHER): Payer: BC Managed Care – PPO | Admitting: Vascular Surgery

## 2010-08-21 ENCOUNTER — Encounter: Payer: Self-pay | Admitting: Vascular Surgery

## 2010-08-21 VITALS — BP 114/77 | HR 64 | Resp 16 | Ht 72.0 in | Wt 223.5 lb

## 2010-08-21 DIAGNOSIS — N186 End stage renal disease: Secondary | ICD-10-CM
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
## 2010-09-16 ENCOUNTER — Encounter (INDEPENDENT_AMBULATORY_CARE_PROVIDER_SITE_OTHER): Payer: Self-pay | Admitting: General Surgery

## 2010-09-16 ENCOUNTER — Ambulatory Visit (INDEPENDENT_AMBULATORY_CARE_PROVIDER_SITE_OTHER): Payer: BC Managed Care – PPO | Admitting: General Surgery

## 2010-09-16 DIAGNOSIS — K439 Ventral hernia without obstruction or gangrene: Secondary | ICD-10-CM | POA: Insufficient documentation

## 2010-09-16 DIAGNOSIS — Z933 Colostomy status: Secondary | ICD-10-CM | POA: Insufficient documentation
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
## 2010-10-08 ENCOUNTER — Encounter: Payer: Self-pay | Admitting: Vascular Surgery

## 2010-10-08 ENCOUNTER — Other Ambulatory Visit: Payer: Self-pay

## 2010-10-08 DIAGNOSIS — N186 End stage renal disease: Secondary | ICD-10-CM

## 2010-10-08 DIAGNOSIS — Z01810 Encounter for preprocedural cardiovascular examination: Secondary | ICD-10-CM

## 2010-10-09 ENCOUNTER — Encounter: Payer: Self-pay | Admitting: Vascular Surgery

## 2010-10-09 ENCOUNTER — Ambulatory Visit (INDEPENDENT_AMBULATORY_CARE_PROVIDER_SITE_OTHER): Payer: BC Managed Care – PPO | Admitting: Vascular Surgery

## 2010-10-09 VITALS — BP 126/79 | HR 89 | Resp 20 | Ht 72.0 in | Wt 210.0 lb

## 2010-10-09 DIAGNOSIS — Z01810 Encounter for preprocedural cardiovascular examination: Secondary | ICD-10-CM

## 2010-10-09 DIAGNOSIS — N186 End stage renal disease: Secondary | ICD-10-CM

## 2010-10-09 NOTE — Progress Notes (Signed)
Patient is a 60 year old male who was previously seen 08/21/2010 for evaluation of hemodialysis access placement. The patient was offered a right brachiocephalic AV fistula at that time but never called schedule the procedure. He returns today for consideration for placement of long-term hemodialysis access. He is right-handed. He is currently dialyzing via a catheter. He dialyzes on Mondays and Wednesdays. He has a pending ostomy takedown by Dr. Lindie Spruce on October 9.  Review of systems: Respiratory: Denies shortness of breath, cardiac: Denies chest pain  Physical exam: Filed Vitals:   10/09/10 1554  BP: 126/79  Pulse: 89  Resp: 20  Height: 6' (1.829 m)  Weight: 210 lb (95.255 kg)   Right upper extremity: 2+ brachial radial pulse placement of a tourniquet on the right upper arm reveals a well dilated easily palpable cephalic vein  Data: Patient had a vein mapping ultrasound today which I reviewed and interpreted shows the cephalic vein in the upper arm between 50 and 80 mm in diameter. The cephalic vein near the forearm was approximately 34 mm in diameter have her previous vein mapping had shown the baby quite small. The left cephalic vein is occluded the left basilic vein is short and not usable for transposition  Assessment/Plan: Patient needs long-term hemodialysis access. He was again offered a right brachiocephalic AV fistula today. Risks benefits possible complications and procedure details were explained the patient today including but limited to bleeding infection steal non-maturation. I offered to schedule the fistula for him next week. However he again stated he wishes to think about this he will call back if he wishes to schedule the procedure at some point in the future.

## 2010-10-09 NOTE — Progress Notes (Signed)
New Access

## 2010-10-14 ENCOUNTER — Encounter (INDEPENDENT_AMBULATORY_CARE_PROVIDER_SITE_OTHER): Payer: BC Managed Care – PPO | Admitting: General Surgery

## 2010-10-16 NOTE — Procedures (Unsigned)
CEPHALIC VEIN MAPPING  INDICATION:  End-stage renal disease.  HISTORY: End-stage renal disease.  EXAM: The right cephalic vein is compressible.  Diameter measurements range from 0.34 to 0.83 cm.  The right basilic vein is compressible.  Diameter measurements range from 0.50 to 0.94 cm.  The left cephalic vein is not compressible.  Diameter measurements range from 0.20 to 0.29 cm.  The left basilic vein is compressible.  Diameter measurements range from 0.52 to 0.55 cm.  See attached worksheet for all measurements.  IMPRESSION: 1. Patent right cephalic and basilic veins with diameter measurements     as described above. 2. Patent left basilic vein with diameter measurements as described     above. 3. The left cephalic vein is noncompressible, suggestive of thrombosis     from the proximal upper arm segment to the antecubital fossa.  The     remainder appears patent with measurements as described on     worksheet. 4. The left basilic vein terminates at the mid brachial vein.  ___________________________________________ Janetta Hora. Fields, MD  SH/MEDQ  D:  10/09/2010  T:  10/09/2010  Job:  [id number]

## 2010-10-21 ENCOUNTER — Other Ambulatory Visit (INDEPENDENT_AMBULATORY_CARE_PROVIDER_SITE_OTHER): Payer: Self-pay

## 2010-10-21 ENCOUNTER — Ambulatory Visit (INDEPENDENT_AMBULATORY_CARE_PROVIDER_SITE_OTHER): Payer: BC Managed Care – PPO | Admitting: General Surgery

## 2010-10-21 ENCOUNTER — Encounter (INDEPENDENT_AMBULATORY_CARE_PROVIDER_SITE_OTHER): Payer: Self-pay | Admitting: General Surgery

## 2010-10-21 ENCOUNTER — Encounter (INDEPENDENT_AMBULATORY_CARE_PROVIDER_SITE_OTHER): Payer: BC Managed Care – PPO | Admitting: General Surgery

## 2010-10-21 DIAGNOSIS — K439 Ventral hernia without obstruction or gangrene: Secondary | ICD-10-CM

## 2010-10-21 DIAGNOSIS — Z933 Colostomy status: Secondary | ICD-10-CM

## 2010-10-21 MED ORDER — HYDROCODONE-ACETAMINOPHEN 5-500 MG PO TABS: 1.0000 | ORAL_TABLET | Freq: Every day | ORAL | Status: DC
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
## 2010-10-24 ENCOUNTER — Inpatient Hospital Stay (HOSPITAL_COMMUNITY): Admission: RE | Admit: 2010-10-24 | Payer: BC Managed Care – PPO | Source: Ambulatory Visit

## 2010-10-28 ENCOUNTER — Ambulatory Visit (HOSPITAL_COMMUNITY): Payer: BC Managed Care – PPO

## 2010-10-28 ENCOUNTER — Ambulatory Visit (HOSPITAL_COMMUNITY)
Admission: RE | Admit: 2010-10-28 | Discharge: 2010-10-28 | Disposition: A | Discharge status: Home / Self Care | Payer: BC Managed Care – PPO | Source: Ambulatory Visit | Attending: Vascular Surgery | Admitting: Vascular Surgery

## 2010-10-28 DIAGNOSIS — N186 End stage renal disease: Secondary | ICD-10-CM

## 2010-10-28 DIAGNOSIS — Z01811 Encounter for preprocedural respiratory examination: Secondary | ICD-10-CM

## 2010-10-28 HISTORY — PX: AV FISTULA PLACEMENT, BRACHIOCEPHALIC: SHX1207

## 2010-10-28 LAB — POCT I-STAT 4, (NA,K, GLUC, HGB,HCT)
Hemoglobin: 11.2 g/dL — ABNORMAL LOW (ref 13.0–17.0)
Sodium: 142 mEq/L (ref 135–145)

## 2010-11-04 NOTE — Op Note (Signed)
  NAMEMILLARD, BAUTCH                 ACCOUNT NO.:  [id number]  MEDICAL RECORD NO.:  [id number]  LOCATION:  [facility]                         FACILITY:  [facility]  PHYSICIAN:  Janetta Hora. Niko Jakel, MD  DATE OF BIRTH:  Jun 12, 1950  DATE OF PROCEDURE:  10/28/2010 DATE OF DISCHARGE:                              OPERATIVE REPORT   PROCEDURE:  Right brachiocephalic arteriovenous fistula.  PREOPERATIVE DIAGNOSIS:  End-stage renal disease.  POSTOPERATIVE DIAGNOSIS:  End-stage renal disease.  ANESTHESIA:  Local with IV sedation.  SURGEON:  Janetta Hora. Tansy Lorek, MD  ASSISTANT:  Pecola Leisure, PA-C  OPERATIVE DETAILS:  After obtaining informed consent, the patient was taken to the operating room.  The patient was placed in supine position on the operating table.  After adequate sedation, the patient's entire right upper extremity was prepped and draped in the usual sterile fashion.  Local anesthesia was infiltrated near the right antecubital crease.  A transverse incision was made in this location and carried down through the subcutaneous tissues down to the level of the cephalic vein.  The vein was of good quality.  The vein was approximately 3.5 mm in diameter in its midsection, but then did have a bifurcated point where the vein split down to about 2.5 mm in diameter.  Brachial artery was dissected free in the medial portion of the incision.  The brachial artery was approximately 3.5-4 mm in diameter.  This was soft in character.  There was a fair amount of distance between the cephalic vein and the brachial artery in the antecubital area, so this required fairly extensive mobilization of the vein to pursuing it over medially and taking down the large amount of subcutaneous tissues underneath the skin and along the fascial border of the biceps muscle.  I was able to take this down and have a tension-free vein for anastomosis.  The patient was given 5000 units of intravenous heparin.  Vessel loops  were used to control brachial artery proximally and distally.  Longitudinal opening was made in the brachial artery and the vein was sewn end of vein to side of artery using a running 7 Prolene suture.  Just prior to completion of anastomosis, this was forebled, backbled, and thoroughly flushed.  Anastomosis was secured, clamps were released, and there was palpable thrill in the fistula immediately.  Hemostasis was obtained. Subcutaneous tissues were reapproximated using running 3-0 Vicryl suture.  Skin was closed with 4-0 Vicryl subcuticular stitch. Dermabond was applied.  The patient tolerated the procedure well and there were no complications.  Instrument, sponge, and needle counts were correct at the end of the case.  The patient was taken to the recovery room in stable condition.     Janetta Hora. Leonce Bale, MD     CEF/MEDQ  D:  10/28/2010  T:  10/28/2010  Job:  [id number]  Electronically Signed by Fabienne Bruns MD on 11/04/2010 12:33:02 PM

## 2010-11-25 ENCOUNTER — Other Ambulatory Visit (HOSPITAL_COMMUNITY): Payer: BC Managed Care – PPO

## 2010-11-25 ENCOUNTER — Ambulatory Visit (INDEPENDENT_AMBULATORY_CARE_PROVIDER_SITE_OTHER): Payer: BC Managed Care – PPO | Admitting: General Surgery

## 2010-11-25 ENCOUNTER — Encounter (INDEPENDENT_AMBULATORY_CARE_PROVIDER_SITE_OTHER): Payer: Self-pay | Admitting: General Surgery

## 2010-11-25 VITALS — BP 110/84 | HR 64 | Temp 98.4°F | Resp 18 | Ht 72.0 in | Wt 227.5 lb

## 2010-11-25 DIAGNOSIS — Z933 Colostomy status: Secondary | ICD-10-CM

## 2010-11-25 NOTE — Progress Notes (Signed)
HPI The patient continues to do well.  PE No changes in his physical examination. I do believe that the patient should be able to get this part of his hernia repaired primarily. The patient has a new arteriovenous fistula in his right upper arm which seems to be functioning well.  Studiy review Are currently no studies to review.  Assessment Arteriovenous fistula in his right upper extremity.  Plan Ahead with a diagnostic study to review prior to scheduling ventral hernia repair and takedown of his stoma.

## 2010-11-26 ENCOUNTER — Ambulatory Visit (HOSPITAL_COMMUNITY)
Admission: RE | Admit: 2010-11-26 | Discharge: 2010-11-26 | Disposition: A | Discharge status: Home / Self Care | Payer: BC Managed Care – PPO | Source: Ambulatory Visit | Attending: General Surgery | Admitting: General Surgery

## 2010-11-26 DIAGNOSIS — Z0389 Encounter for observation for other suspected diseases and conditions ruled out: Secondary | ICD-10-CM | POA: Insufficient documentation

## 2010-11-26 DIAGNOSIS — Z933 Colostomy status: Secondary | ICD-10-CM | POA: Insufficient documentation

## 2010-12-02 ENCOUNTER — Encounter (INDEPENDENT_AMBULATORY_CARE_PROVIDER_SITE_OTHER): Payer: Self-pay | Admitting: General Surgery

## 2010-12-02 ENCOUNTER — Ambulatory Visit (INDEPENDENT_AMBULATORY_CARE_PROVIDER_SITE_OTHER): Payer: BC Managed Care – PPO | Admitting: General Surgery

## 2010-12-02 VITALS — BP 108/74 | HR 68 | Temp 97.8°F | Resp 12 | Ht 72.0 in | Wt 226.6 lb

## 2010-12-02 DIAGNOSIS — K439 Ventral hernia without obstruction or gangrene: Secondary | ICD-10-CM

## 2010-12-02 MED ORDER — HYDROCODONE-ACETAMINOPHEN 5-500 MG PO TABS: 1.0000 | ORAL_TABLET | Freq: Every day | ORAL | Status: DC

## 2010-12-02 NOTE — Progress Notes (Signed)
Addended by: Cherylynn Ridges III on: 12/02/2010 11:38 AM   Modules accepted: Orders

## 2010-12-02 NOTE — Progress Notes (Signed)
HPI Was able to review the patient's barium/Gastrografin. The patient has a significant amount of residual colon. The takedown of this colostomy should not be a problem.  He will get a repair of the ventral hernia at the same time this may require the use of some biologic material.  PE No change in the patient's physical examination  Studiy review I have reviewed his Gastrografin study and it shows a significant amount of residual colon.  Assessment The patient is ready for surgical reversal of his colostomy and ventral hernia repair.  Plan We will go ahead and set him up with a bowel prep and a takedown of his colostomy for ventral hernia repair also.

## 2010-12-10 ENCOUNTER — Encounter: Payer: Self-pay | Admitting: Thoracic Diseases

## 2010-12-11 ENCOUNTER — Encounter: Payer: Self-pay | Admitting: Thoracic Diseases

## 2010-12-11 ENCOUNTER — Ambulatory Visit (INDEPENDENT_AMBULATORY_CARE_PROVIDER_SITE_OTHER): Payer: BC Managed Care – PPO | Admitting: Thoracic Diseases

## 2010-12-11 VITALS — BP 109/62 | HR 72 | Resp 20 | Ht 72.0 in | Wt 230.0 lb

## 2010-12-11 DIAGNOSIS — N186 End stage renal disease: Secondary | ICD-10-CM

## 2010-12-11 NOTE — Progress Notes (Signed)
[facility] [place]  Postoperative Visit hemodialysis access   Date of Surgery: 10/19/10 Surgeon: [facility] HD center: [facility] [place]/ Dr. Lowell Guitar   HPI: John R Kelnhofer Sr. is a 60 y.o. male who is 6 weeks S/P creation/revision of right Brachiocephalic Hemodialysis access. The patient denies symptoms of numbness, tingling, weakness and denies pain in the operative limb. Patient is here for post -op evaluation to assess healing and maturation of right HD AVF .  Pt is on hemodialysis through  right IJ catheter on monday and Friday only.  Physical Examination  Filed Vitals:   12/11/10 1459  BP: 109/62  Pulse: 72  Resp: 20    WDWN male in NAD.  right upper extremity Incision is healed Skin color is normal   Hand grip is 5/5 and sensation in digits is intact; There is a good thrill and good bruit in the right brachiocephalic. The graft/fistula is easily palpable and of adequate size  Assessment/Plan John R Lienhard Sr. is a 60 y.o. year old who is s/p creation/revision of right upper extremity Hemodialysis access. Follow-up in 6 weeks  The patient's access will be ready for use in 8 weeks.  Clinic MD: CE Darrick Penna, MD

## 2010-12-12 ENCOUNTER — Telehealth (INDEPENDENT_AMBULATORY_CARE_PROVIDER_SITE_OTHER): Payer: Self-pay

## 2010-12-12 NOTE — Telephone Encounter (Signed)
[name] called and said when he was at pre op they told him he was in as out patient and was concerned because [name] told him he would have  Hospital stay. I looked up his snap shot and told him in our system he is scheduled as out patient to bed which meant he will be having a hospital stay.

## 2010-12-17 ENCOUNTER — Encounter (HOSPITAL_COMMUNITY): Payer: Self-pay | Admitting: Pharmacy Technician

## 2010-12-23 ENCOUNTER — Encounter (HOSPITAL_COMMUNITY): Payer: Self-pay

## 2010-12-23 ENCOUNTER — Encounter (HOSPITAL_COMMUNITY)
Admission: RE | Admit: 2010-12-23 | Discharge: 2010-12-23 | Disposition: A | Discharge status: Home / Self Care | Payer: BC Managed Care – PPO | Source: Ambulatory Visit | Attending: General Surgery | Admitting: General Surgery

## 2010-12-23 LAB — CBC
Hemoglobin: 12.3 g/dL — ABNORMAL LOW (ref 13.0–17.0)
MCV: 93.8 fL (ref 78.0–100.0)
Platelets: 201 10*3/uL (ref 150–400)
RBC: 3.88 MIL/uL — ABNORMAL LOW (ref 4.22–5.81)
WBC: 10.4 10*3/uL (ref 4.0–10.5)

## 2010-12-23 LAB — DIFFERENTIAL
Basophils Absolute: 0.1 10*3/uL (ref 0.0–0.1)
Eosinophils Absolute: 0.4 10*3/uL (ref 0.0–0.7)
Lymphs Abs: 3.6 10*3/uL (ref 0.7–4.0)
Neutro Abs: 5.7 10*3/uL (ref 1.7–7.7)

## 2010-12-23 LAB — COMPREHENSIVE METABOLIC PANEL
ALT: 19 U/L (ref 0–53)
AST: 20 U/L (ref 0–37)
CO2: 27 mEq/L (ref 19–32)
Chloride: 105 mEq/L (ref 96–112)
Creatinine, Ser: 5.49 mg/dL — ABNORMAL HIGH (ref 0.50–1.35)
GFR calc non Af Amer: 10 mL/min — ABNORMAL LOW (ref >90)
Sodium: 143 mEq/L (ref 135–145)
Total Bilirubin: 0.2 mg/dL — ABNORMAL LOW (ref 0.3–1.2)

## 2010-12-23 MED ORDER — FLEET ENEMA 7-19 GM/118ML RE ENEM: 1.0000 | ENEMA | Freq: Once | RECTAL | Status: DC

## 2010-12-23 NOTE — Pre-Procedure Instructions (Signed)
20 [name]  12/23/2010   Your procedure is scheduled on: Friday 01/02/11   Report to [facility] at 830 AM.  Call this number if you have problems the morning of surgery: [phone]   Remember:   Do not eat food:After Midnight.  May have clear liquids: up to 4 Hours before arrival.  Clear liquids include soda, tea, black coffee, apple or grape juice, broth.  Take these medicines the morning of surgery with A SIP OF WATER: HYDROCODONE   Do not wear jewelry, make-up or nail polish.  Do not wear lotions, powders, or perfumes. You may wear deodorant.  Do not shave 48 hours prior to surgery.  Do not bring valuables to the hospital.  Contacts, dentures or bridgework may not be worn into surgery.  Leave suitcase in the car. After surgery it may be brought to your room.  For patients admitted to the hospital, checkout time is 11:00 AM the day of discharge.   Patients discharged the day of surgery will not be allowed to drive home.  Name and phone number of your driver:   Special Instructions: CHG Shower Use Special Wash: 1/2 bottle night before surgery and 1/2 bottle morning of surgery.   Please read over the following fact sheets that you were given: Pain Booklet, MRSA Information and Surgical Site Infection Prevention   FLEET ENEMA AND 2 DAY BOWEL PREP AS INSTRUCTED BY DR WYATT.

## 2010-12-30 ENCOUNTER — Telehealth (INDEPENDENT_AMBULATORY_CARE_PROVIDER_SITE_OTHER): Payer: Self-pay

## 2010-12-30 NOTE — Telephone Encounter (Signed)
Patient called concerned about colon prep for hernia repair and colostomy take down surgery. Patient stated he was a renal patient and was concerned about drinking the    sodium magnesium citrate required for his bowel prep. He was told to avoid heavy metals.  Per Dr. Lindie Spruce it will be okay. Patient aware.

## 2011-01-02 ENCOUNTER — Encounter (HOSPITAL_COMMUNITY): Payer: Self-pay | Admitting: Certified Registered Nurse Anesthetist

## 2011-01-02 ENCOUNTER — Other Ambulatory Visit (INDEPENDENT_AMBULATORY_CARE_PROVIDER_SITE_OTHER): Payer: Self-pay | Admitting: General Surgery

## 2011-01-02 ENCOUNTER — Ambulatory Visit (HOSPITAL_COMMUNITY): Payer: BC Managed Care – PPO | Admitting: Certified Registered Nurse Anesthetist

## 2011-01-02 ENCOUNTER — Inpatient Hospital Stay (HOSPITAL_COMMUNITY)
Admission: RE | Admit: 2011-01-02 | Discharge: 2011-01-10 | DRG: 148 | Disposition: A | Discharge status: Home / Self Care | Payer: BC Managed Care – PPO | Source: Ambulatory Visit | Attending: Surgery | Admitting: Surgery

## 2011-01-02 ENCOUNTER — Encounter (HOSPITAL_COMMUNITY): Admission: RE | Disposition: A | Discharge status: Home / Self Care | Payer: Self-pay | Source: Ambulatory Visit

## 2011-01-02 DIAGNOSIS — K432 Incisional hernia without obstruction or gangrene: Secondary | ICD-10-CM | POA: Diagnosis present

## 2011-01-02 DIAGNOSIS — Z9889 Other specified postprocedural states: Secondary | ICD-10-CM

## 2011-01-02 DIAGNOSIS — K929 Disease of digestive system, unspecified: Secondary | ICD-10-CM | POA: Diagnosis not present

## 2011-01-02 DIAGNOSIS — Y838 Other surgical procedures as the cause of abnormal reaction of the patient, or of later complication, without mention of misadventure at the time of the procedure: Secondary | ICD-10-CM | POA: Diagnosis not present

## 2011-01-02 DIAGNOSIS — Z8719 Personal history of other diseases of the digestive system: Secondary | ICD-10-CM

## 2011-01-02 DIAGNOSIS — D649 Anemia, unspecified: Secondary | ICD-10-CM | POA: Diagnosis present

## 2011-01-02 DIAGNOSIS — Z992 Dependence on renal dialysis: Secondary | ICD-10-CM

## 2011-01-02 DIAGNOSIS — N186 End stage renal disease: Secondary | ICD-10-CM | POA: Diagnosis present

## 2011-01-02 DIAGNOSIS — D631 Anemia in chronic kidney disease: Secondary | ICD-10-CM | POA: Diagnosis present

## 2011-01-02 DIAGNOSIS — Z433 Encounter for attention to colostomy: Principal | ICD-10-CM

## 2011-01-02 DIAGNOSIS — K560 Paralytic ileus: Secondary | ICD-10-CM | POA: Diagnosis not present

## 2011-01-02 DIAGNOSIS — N2581 Secondary hyperparathyroidism of renal origin: Secondary | ICD-10-CM | POA: Diagnosis present

## 2011-01-02 HISTORY — PX: COLOSTOMY CLOSURE: SHX1381

## 2011-01-02 HISTORY — PX: VENTRAL HERNIA REPAIR: SHX424

## 2011-01-02 LAB — POCT I-STAT 4, (NA,K, GLUC, HGB,HCT)
HCT: 36 % — ABNORMAL LOW (ref 39.0–52.0)
Sodium: 138 mEq/L (ref 135–145)

## 2011-01-02 SURGERY — COLOSTOMY CLOSURE
Anesthesia: General | Site: Abdomen | Wound class: Clean Contaminated

## 2011-01-02 MED ORDER — PHENYLEPHRINE HCL 10 MG/ML IJ SOLN
INTRAMUSCULAR | Status: DC | PRN
  Administered 2011-01-02 (×2): 40 ug via INTRAVENOUS

## 2011-01-02 MED ORDER — EPHEDRINE SULFATE 50 MG/ML IJ SOLN
INTRAMUSCULAR | Status: DC | PRN
  Administered 2011-01-02: 7.5 mg via INTRAVENOUS
  Administered 2011-01-02 (×10): 5 mg via INTRAVENOUS
  Administered 2011-01-02: 7.5 mg via INTRAVENOUS
  Administered 2011-01-02: 10 mg via INTRAVENOUS

## 2011-01-02 MED ORDER — FENTANYL CITRATE 0.05 MG/ML IJ SOLN
INTRAMUSCULAR | Status: DC | PRN
  Administered 2011-01-02: 150 ug via INTRAVENOUS
  Administered 2011-01-02 (×3): 100 ug via INTRAVENOUS
  Administered 2011-01-02: 50 ug via INTRAVENOUS

## 2011-01-02 MED ORDER — DIPHENHYDRAMINE HCL 50 MG/ML IJ SOLN
12.5000 mg | Freq: Four times a day (QID) | INTRAMUSCULAR | Status: DC | PRN
  Administered 2011-01-04 – 2011-01-05 (×2): 12.5 mg via INTRAVENOUS
  Filled 2011-01-02: qty 1
  Filled 2011-01-02: qty 0.25
  Filled 2011-01-02: qty 1

## 2011-01-02 MED ORDER — GLYCOPYRROLATE 0.2 MG/ML IJ SOLN
INTRAMUSCULAR | Status: DC | PRN
  Administered 2011-01-02: .8 mg via INTRAVENOUS

## 2011-01-02 MED ORDER — PROPOFOL 10 MG/ML IV EMUL
INTRAVENOUS | Status: DC | PRN
  Administered 2011-01-02: 180 mg via INTRAVENOUS

## 2011-01-02 MED ORDER — NALOXONE HCL 0.4 MG/ML IJ SOLN
0.4000 mg | INTRAMUSCULAR | Status: DC | PRN
  Filled 2011-01-02: qty 1

## 2011-01-02 MED ORDER — ROCURONIUM BROMIDE 100 MG/10ML IV SOLN
INTRAVENOUS | Status: DC | PRN
  Administered 2011-01-02: 40 mg via INTRAVENOUS
  Administered 2011-01-02: 10 mg via INTRAVENOUS

## 2011-01-02 MED ORDER — HYDROMORPHONE 0.3 MG/ML IV SOLN
INTRAVENOUS | Status: DC
  Administered 2011-01-02: 15:00:00 via INTRAVENOUS
  Administered 2011-01-02: 0.6 mg via INTRAVENOUS
  Administered 2011-01-02: 0.3 mg via INTRAVENOUS
  Administered 2011-01-02: 1.5 mg via INTRAVENOUS
  Administered 2011-01-03: 1.2 mg via INTRAVENOUS
  Administered 2011-01-03: 0.3 mg via INTRAVENOUS
  Administered 2011-01-03 (×3): 0.9 mg via INTRAVENOUS
  Administered 2011-01-03: 0.6 mg via INTRAVENOUS
  Administered 2011-01-04 (×2): 0.9 mg via INTRAVENOUS
  Administered 2011-01-04: 2.1 mg via INTRAVENOUS
  Administered 2011-01-05: 0.9 mg via INTRAVENOUS
  Administered 2011-01-05: 0.6 mg via INTRAVENOUS
  Administered 2011-01-05: 1.2 mg via INTRAVENOUS
  Administered 2011-01-05: 11:00:00 via INTRAVENOUS
  Administered 2011-01-06: 2.1 mL via INTRAVENOUS
  Administered 2011-01-06: 2.1 mg via INTRAVENOUS
  Administered 2011-01-06: 0.3 mg via INTRAVENOUS
  Administered 2011-01-06: 0.6 mg via INTRAVENOUS
  Administered 2011-01-06: 3 mg via INTRAVENOUS
  Administered 2011-01-06: 10:00:00 via INTRAVENOUS
  Administered 2011-01-07: 1.8 mg via INTRAVENOUS
  Administered 2011-01-07: 2.66 mL via INTRAVENOUS
  Administered 2011-01-07: 04:00:00 via INTRAVENOUS
  Filled 2011-01-02 (×4): qty 25

## 2011-01-02 MED ORDER — SODIUM CHLORIDE 0.9 % IV SOLN
INTRAVENOUS | Status: DC
  Administered 2011-01-02 (×4): via INTRAVENOUS

## 2011-01-02 MED ORDER — MIDAZOLAM HCL 5 MG/5ML IJ SOLN
INTRAMUSCULAR | Status: DC | PRN
  Administered 2011-01-02: 1 mg via INTRAVENOUS

## 2011-01-02 MED ORDER — 0.9 % SODIUM CHLORIDE (POUR BTL) OPTIME
TOPICAL | Status: DC | PRN
  Administered 2011-01-02 (×2): 1000 mL

## 2011-01-02 MED ORDER — DEXTROSE 5 % IV SOLN
2.0000 g | INTRAVENOUS | Status: DC
  Filled 2011-01-02: qty 2

## 2011-01-02 MED ORDER — SODIUM CHLORIDE 0.9 % IV SOLN
INTRAVENOUS | Status: DC
  Administered 2011-01-02 – 2011-01-07 (×4): via INTRAVENOUS

## 2011-01-02 MED ORDER — ONDANSETRON HCL 4 MG/2ML IJ SOLN: 4.0000 mg | Freq: Four times a day (QID) | INTRAMUSCULAR | Status: DC | PRN

## 2011-01-02 MED ORDER — NEOSTIGMINE METHYLSULFATE 1 MG/ML IJ SOLN
INTRAMUSCULAR | Status: DC | PRN
  Administered 2011-01-02: 5 mg via INTRAVENOUS

## 2011-01-02 MED ORDER — HYDROMORPHONE HCL PF 1 MG/ML IJ SOLN
0.2500 mg | INTRAMUSCULAR | Status: DC | PRN
  Administered 2011-01-02 (×4): 0.5 mg via INTRAVENOUS

## 2011-01-02 MED ORDER — VECURONIUM BROMIDE 10 MG IV SOLR
INTRAVENOUS | Status: DC | PRN
  Administered 2011-01-02: 2 mg via INTRAVENOUS
  Administered 2011-01-02: 3 mg via INTRAVENOUS
  Administered 2011-01-02 (×2): 1 mg via INTRAVENOUS
  Administered 2011-01-02: 5 mg via INTRAVENOUS

## 2011-01-02 MED ORDER — SODIUM CHLORIDE 0.9 % IJ SOLN: 9.0000 mL | INTRAMUSCULAR | Status: DC | PRN

## 2011-01-02 MED ORDER — POVIDONE-IODINE 10 % EX OINT
TOPICAL_OINTMENT | CUTANEOUS | Status: DC | PRN
  Administered 2011-01-02: 1 via TOPICAL

## 2011-01-02 MED ORDER — ALBUMIN HUMAN 5 % IV SOLN
INTRAVENOUS | Status: DC | PRN
  Administered 2011-01-02: 13:00:00 via INTRAVENOUS

## 2011-01-02 MED ORDER — ONDANSETRON HCL 4 MG/2ML IJ SOLN
INTRAMUSCULAR | Status: DC | PRN
  Administered 2011-01-02: 4 mg via INTRAVENOUS

## 2011-01-02 MED ORDER — DEXTROSE 5 % IV SOLN
2.0000 g | INTRAVENOUS | Status: DC | PRN
  Administered 2011-01-02: 2 g via INTRAVENOUS

## 2011-01-02 MED ORDER — HYDROMORPHONE HCL PF 1 MG/ML IJ SOLN
INTRAMUSCULAR | Status: AC
  Filled 2011-01-02: qty 1

## 2011-01-02 MED ORDER — ONDANSETRON HCL 4 MG/2ML IJ SOLN
4.0000 mg | Freq: Four times a day (QID) | INTRAMUSCULAR | Status: DC | PRN
  Administered 2011-01-09: 4 mg via INTRAVENOUS
  Filled 2011-01-02: qty 2

## 2011-01-02 MED ORDER — DIPHENHYDRAMINE HCL 12.5 MG/5ML PO ELIX
12.5000 mg | ORAL_SOLUTION | Freq: Four times a day (QID) | ORAL | Status: DC | PRN
  Filled 2011-01-02: qty 5

## 2011-01-02 MED ORDER — ONDANSETRON HCL 4 MG PO TABS
4.0000 mg | ORAL_TABLET | Freq: Four times a day (QID) | ORAL | Status: DC | PRN
  Administered 2011-01-09: 4 mg via ORAL
  Filled 2011-01-02: qty 1

## 2011-01-02 SURGICAL SUPPLY — 86 items
BAG DECANTER FOR FLEXI CONT (MISCELLANEOUS) ×1 IMPLANT
BINDER ABD UNIV 12 45-62 (WOUND CARE) ×1 IMPLANT
BINDER ABDOMINAL 46IN 62IN (WOUND CARE) ×2
BLADE SURG ROTATE 9660 (MISCELLANEOUS) ×1 IMPLANT
CANISTER SUCTION 2500CC (MISCELLANEOUS) ×2 IMPLANT
CHLORAPREP W/TINT 26ML (MISCELLANEOUS) ×2 IMPLANT
CLOTH BEACON ORANGE TIMEOUT ST (SAFETY) ×2 IMPLANT
COVER SURGICAL LIGHT HANDLE (MISCELLANEOUS) ×2 IMPLANT
DEVICE TROCAR PUNCTURE CLOSURE (ENDOMECHANICALS) ×1 IMPLANT
DRAPE LAPAROSCOPIC ABDOMINAL (DRAPES) ×2 IMPLANT
DRAPE UTILITY 15X26 W/TAPE STR (DRAPE) ×4 IMPLANT
DRAPE WARM FLUID 44X44 (DRAPE) ×2 IMPLANT
ELECT CAUTERY BLADE 6.4 (BLADE) ×2 IMPLANT
ELECT REM PT RETURN 9FT ADLT (ELECTROSURGICAL) ×2
ELECTRODE REM PT RTRN 9FT ADLT (ELECTROSURGICAL) ×1 IMPLANT
EVACUATOR SILICONE 100CC (DRAIN) ×2 IMPLANT
GLOVE BIO SURGEON STRL SZ 6.5 (GLOVE) ×3 IMPLANT
GLOVE BIO SURGEON STRL SZ7.5 (GLOVE) ×3 IMPLANT
GLOVE BIO SURGEON STRL SZ8 (GLOVE) ×2 IMPLANT
GLOVE BIOGEL PI IND STRL 6.5 (GLOVE) IMPLANT
GLOVE BIOGEL PI IND STRL 7.5 (GLOVE) IMPLANT
GLOVE BIOGEL PI IND STRL 8 (GLOVE) ×1 IMPLANT
GLOVE BIOGEL PI INDICATOR 6.5 (GLOVE) ×2
GLOVE BIOGEL PI INDICATOR 7.5 (GLOVE) ×3
GLOVE BIOGEL PI INDICATOR 8 (GLOVE) ×4
GLOVE ECLIPSE 7.5 STRL STRAW (GLOVE) ×4 IMPLANT
GLOVE ECLIPSE 8.0 STRL XLNG CF (GLOVE) ×2 IMPLANT
GOWN PREVENTION PLUS XLARGE (GOWN DISPOSABLE) ×3 IMPLANT
GOWN STRL NON-REIN LRG LVL3 (GOWN DISPOSABLE) ×8 IMPLANT
KIT BASIN OR (CUSTOM PROCEDURE TRAY) ×2 IMPLANT
KIT ROOM TURNOVER OR (KITS) ×2 IMPLANT
LEGGING LITHOTOMY PAIR STRL (DRAPES) IMPLANT
LIGASURE IMPACT 36 18CM CVD LR (INSTRUMENTS) ×1 IMPLANT
MARKER SKIN DUAL TIP RULER LAB (MISCELLANEOUS) ×1 IMPLANT
NDL HYPO 25GX1X1/2 BEV (NEEDLE) ×1 IMPLANT
NEEDLE HYPO 25GX1X1/2 BEV (NEEDLE) IMPLANT
NS IRRIG 1000ML POUR BTL (IV SOLUTION) ×4 IMPLANT
PACK GENERAL/GYN (CUSTOM PROCEDURE TRAY) ×2 IMPLANT
PAD ARMBOARD 7.5X6 YLW CONV (MISCELLANEOUS) ×4 IMPLANT
RETAINER VISCERA MED (MISCELLANEOUS) ×1 IMPLANT
SPECIMEN JAR MEDIUM (MISCELLANEOUS) IMPLANT
SPECIMEN JAR SMALL (MISCELLANEOUS) ×1 IMPLANT
SPONGE GAUZE 4X4 12PLY (GAUZE/BANDAGES/DRESSINGS) ×3 IMPLANT
SPONGE LAP 18X18 X RAY DECT (DISPOSABLE) ×7 IMPLANT
STAPLER GUN LINEAR PROX 60 (STAPLE) ×1 IMPLANT
STAPLER PROXIMATE 75MM BLUE (STAPLE) ×1 IMPLANT
STAPLER VISISTAT 35W (STAPLE) ×2 IMPLANT
SUCTION POOLE TIP (SUCTIONS) IMPLANT
SUT ETHIBOND NAB CTX #1 30IN (SUTURE) IMPLANT
SUT ETHILON 2 0 FS 18 (SUTURE) ×2 IMPLANT
SUT MNCRL AB 4-0 PS2 18 (SUTURE) ×1 IMPLANT
SUT NOVA 1 T20/GS 25DT (SUTURE) ×4 IMPLANT
SUT NOVA NAB DX-16 0-1 5-0 T12 (SUTURE) IMPLANT
SUT PDS AB 1 TP1 54 (SUTURE) IMPLANT
SUT PDS AB 1 TP1 96 (SUTURE) IMPLANT
SUT PROLENE 1 CT (SUTURE) IMPLANT
SUT SILK 2 0 (SUTURE) ×2
SUT SILK 2 0 SH (SUTURE) ×2 IMPLANT
SUT SILK 2 0 SH CR/8 (SUTURE) ×2 IMPLANT
SUT SILK 2 0 TIES 10X30 (SUTURE) ×2 IMPLANT
SUT SILK 2-0 18XBRD TIE 12 (SUTURE) ×1 IMPLANT
SUT SILK 3 0 (SUTURE) ×2
SUT SILK 3 0 SH CR/8 (SUTURE) ×2 IMPLANT
SUT SILK 3 0 TIES 10X30 (SUTURE) ×2 IMPLANT
SUT SILK 3-0 18XBRD TIE 12 (SUTURE) IMPLANT
SUT VIC AB 1 CT1 18XCR BRD 8 (SUTURE) IMPLANT
SUT VIC AB 1 CT1 8-18 (SUTURE)
SUT VIC AB 2-0 CT1 18 (SUTURE) ×1 IMPLANT
SUT VIC AB 2-0 CT1 27 (SUTURE)
SUT VIC AB 2-0 CT1 TAPERPNT 27 (SUTURE) IMPLANT
SUT VIC AB 3-0 54X BRD REEL (SUTURE) IMPLANT
SUT VIC AB 3-0 BRD 54 (SUTURE)
SUT VIC AB 3-0 CT1 27 (SUTURE)
SUT VIC AB 3-0 CT1 TAPERPNT 27 (SUTURE) IMPLANT
SUT VIC AB 3-0 SH 27 (SUTURE)
SUT VIC AB 3-0 SH 27X BRD (SUTURE) IMPLANT
SUT VIC AB 3-0 SH 8-18 (SUTURE) ×2 IMPLANT
SUT VICRYL AB 3 0 TIES (SUTURE) IMPLANT
SYR CONTROL 10ML LL (SYRINGE) IMPLANT
TAPE CLOTH SURG 4X10 WHT LF (GAUZE/BANDAGES/DRESSINGS) ×1 IMPLANT
TISSUE MATRIX STRATTICE 20X25 (Tissue) ×1 IMPLANT
TOWEL OR 17X24 6PK STRL BLUE (TOWEL DISPOSABLE) ×1 IMPLANT
TOWEL OR 17X26 10 PK STRL BLUE (TOWEL DISPOSABLE) ×2 IMPLANT
TRAY FOLEY CATH 14FRSI W/METER (CATHETERS) IMPLANT
WATER STERILE IRR 1000ML POUR (IV SOLUTION) ×1 IMPLANT
YANKAUER SUCT BULB TIP NO VENT (SUCTIONS) ×1 IMPLANT

## 2011-01-02 NOTE — Op Note (Signed)
OPERATIVE REPORT  DATE OF OPERATION: 01/02/2011  PATIENT:  John R Mella Sr.  60 y.[name] male  PRE-OPERATIVE DIAGNOSIS:  functioning colostomy, ventral hernia  POST-OPERATIVE DIAGNOSIS:  functioning colostomy, ventral hernia  PROCEDURE:  Procedure(s): COLOSTOMY CLOSURE HERNIA REPAIR VENTRAL ADULT with BIologialc Prosthesis  SURGEON:  Jetty Duhamel, M.D.  ASSISTANT: Janee Morn, Gross  ANESTHESIA:   general  EBL: 500 ml  BLOOD ADMINISTERED: none  DRAINS: Urinary Catheter (Foley) and (2) Blake drain(s) in the subcutaneous tissue   SPECIMEN:  Source of Specimen:  Resected ostomy  COUNTS CORRECT:  YES  PROCEDURE DETAILS: The patient was taken to the operating room and placed on the table in the supine position. After proper time out was performed identifying the patient and the procedure to be performed we used an old silk suture on an SH needle to close his colostomy in the right upper quadrant. We then went ahead and prepped and draped in usual sterile manner.  The patient had a large floppy mid abdominal ventral hernia from his previous open abdomen with loss of domain. Skin graft and then down to the midline portion which was attached to the bowel underneath. The initial port the operation was detaching the skin graft away from the bowel and also the small bowel from the edges of the fascia.  We made our initial incision circumferentially around the edges of the split-thickness skin graft and the edge of the normal skin. Using electrocautery Metzenbaum scissors and blunt and sharp dissection to dissect away the fascia and the small bowel and the skin graft from the edges of the fascia and the small bowel underneath. Directly attached to the small bowel underneath was the omentum and the skin graft. This had to be taken away along with parts of the omentum as we completely detached the skin graft and the small bowel away from the fascial edge.  Once we completely detached the skin  graft and the small bowel from the fashion that we made a circumferential plains above the fascia using electrocautery in order for [place] to have something to attach the subsequent biologic Strattice materially to the underlying surface of the fascia. So, circumferentially we dissected all planes of the knees we were able to also get the small bowel away from the edges. Prior to doing the ventral hernia repaired we took down the colostomy from the right upper quadrant of the abdomen by incising the skin with electrocautery and dissecting out the stoma from the subcutaneous tissue and its fascial attachments.  The distal bowel to attach to the proximal transverse colostomy was found to be attached to the underlying fascia we had to dissect out a way also. We traced it up to his proximal portion which was marked by a pulley stitch at its proximal end. We freed this up in the left upper quadrant and subsequently did a side-to-side anastomosis using a GIA stapler with the ostomy that had been taken down. The small bowel was internal to this anastomosis. A TA 60 stapler was used to close off the resulting enterotomy. Once that was done we irrigated with saline and we subsequently did are hernia closure using the 20 x 25 cm piece of Stratus mesh.  We used #1 Novafil pop-off sutures in order to close off the lower portion of the wound of for approximately 5 or 6 stitches about 8 cm. We then used the mesh and the rest of the wound attached it using a horizontal mattress U stitches attaching  it to the biologic material. This was done circumferentially for about 12 stitches. This was pulled up to the abdominal wall and had to be pulled up to the costal margin superiorly as the hernia defect went up to the xiphoid and the costal margin. Once the mesh was completely in place which did not bring any patch on top of it however we could bring subcutaneous tissue on top of using interrupted 2-0 Vicryl sutures. In the subcutaneous  fascial plane where 219 French Blake drains which were secured in place brought out they lower abdominal wall. Once we had the subcutaneous close on top of the drains we used a distal staples to close off the ostomy site in the right upper quadrant and the midline wound. The fascia with the ostomy came out was closed also with interrupted #1 Novafil sutures.  An abdominal binder was used to protect the wound along with Betadine ointment 4 x 4 gauze. All counts were correct including needle sponges and instruments.  PATIENT DISPOSITION:  PACU - hemodynamically stable.   [name],[name] 12/21/20122:33 PM

## 2011-01-02 NOTE — Anesthesia Procedure Notes (Addendum)
Procedure Name: Intubation Date/Time: 01/02/2011 10:36 AM Performed by: Delbert Harness Pre-anesthesia Checklist: Patient identified, Emergency Drugs available, Suction available, Patient being monitored and Timeout performed Patient Re-evaluated:Patient Re-evaluated prior to inductionOxygen Delivery Method: Circle System Utilized Preoxygenation: Pre-oxygenation with 100% oxygen Intubation Type: IV induction Ventilation: Mask ventilation without difficulty and Oral airway inserted - appropriate to patient size Laryngoscope Size: Hyacinth Meeker and 2 Grade View: Grade I Tube type: Oral Tube size: 7.5 mm Number of attempts: 1 Airway Equipment and Method: stylet Placement Confirmation: ETT inserted through vocal cords under direct vision,  positive ETCO2 and breath sounds checked- equal and bilateral Secured at: 22 cm Tube secured with: Tape Dental Injury: Teeth and Oropharynx as per pre-operative assessment
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
## 2011-01-02 NOTE — Preoperative (Signed)
Beta Blockers   Reason not to administer Beta Blockers:Not Applicable 

## 2011-01-02 NOTE — Transfer of Care (Signed)
Immediate Anesthesia Transfer of Care Note  Patient: [name]  Procedure(s) Performed:  COLOSTOMY CLOSURE - Colostomy takedown; HERNIA REPAIR VENTRAL ADULT - Ventral hernia repair with biologic mesh  Patient Location: PACU  Anesthesia Type: General  Level of Consciousness: awake, alert  and oriented  Airway & Oxygen Therapy: Patient Spontanous Breathing and Patient connected to nasal cannula oxygen  Post-op Assessment: Report given to PACU RN and Post -op Vital signs reviewed and stable  Post vital signs: Reviewed and stable  Complications: No apparent anesthesia complications

## 2011-01-02 NOTE — Anesthesia Postprocedure Evaluation (Signed)
  Anesthesia Post-op Note  Patient: John R Streat Sr.  Procedure(s) Performed:  COLOSTOMY CLOSURE - Colostomy takedown; HERNIA REPAIR VENTRAL ADULT - Ventral hernia repair with biologic mesh  Patient Location: PACU  Anesthesia Type: General  Level of Consciousness: awake, alert  and oriented  Airway and Oxygen Therapy: Patient Spontanous Breathing  Post-op Pain: mild  Post-op Assessment: Post-op Vital signs reviewed, Patient's Cardiovascular Status Stable, Respiratory Function Stable, Patent Airway, No signs of Nausea or vomiting and Pain level controlled  Post-op Vital Signs: Reviewed and stable  Complications: No apparent anesthesia complications
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
## 2011-01-03 LAB — RENAL FUNCTION PANEL
Albumin: 3.3 g/dL — ABNORMAL LOW (ref 3.5–5.2)
BUN: 42 mg/dL — ABNORMAL HIGH (ref 6–23)
CO2: 19 mEq/L (ref 19–32)
Chloride: 97 mEq/L (ref 96–112)
Creatinine, Ser: 7.31 mg/dL — ABNORMAL HIGH (ref 0.50–1.35)
Glucose, Bld: 170 mg/dL — ABNORMAL HIGH (ref 70–99)
Potassium: 5.4 mEq/L — ABNORMAL HIGH (ref 3.5–5.1)

## 2011-01-03 LAB — CBC
HCT: 32 % — ABNORMAL LOW (ref 39.0–52.0)
Hemoglobin: 10.8 g/dL — ABNORMAL LOW (ref 13.0–17.0)
MCV: 92.2 fL (ref 78.0–100.0)
Platelets: 238 10*3/uL (ref 150–400)
RBC: 3.47 MIL/uL — ABNORMAL LOW (ref 4.22–5.81)
WBC: 21.6 10*3/uL — ABNORMAL HIGH (ref 4.0–10.5)

## 2011-01-03 MED ORDER — PHENOL 1.4 % MT LIQD
1.0000 | OROMUCOSAL | Status: DC | PRN
  Filled 2011-01-03: qty 177

## 2011-01-03 MED ORDER — SODIUM CHLORIDE 0.9 % IV SOLN
500.0000 mL | Freq: Once | INTRAVENOUS | Status: AC
  Administered 2011-01-03: 500 mL via INTRAVENOUS

## 2011-01-03 MED ORDER — HEPARIN SODIUM (PORCINE) 1000 UNIT/ML DIALYSIS
20.0000 [IU]/kg | INTRAMUSCULAR | Status: DC | PRN
  Administered 2011-01-05: 2000 [IU] via INTRAVENOUS_CENTRAL
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
## 2011-01-03 NOTE — Progress Notes (Signed)
GS Progress Note/POD #1 Subjective: Patient having intermittent episodes of gagging around NGT.  Mouth and throat feel very dry.  Pulse increases with gagging.  Taking ice chips.  Objective: Vital signs in last 24 hours: Temp:  [97.4 F (36.3 C)-98.8 F (37.1 C)] 98.4 F (36.9 C) (12/22 0755) Pulse Rate:  [61-109] 96  (12/22 0755) Resp:  [11-17] 16  (12/22 0755) BP: (100-125)/(61-71) 103/67 mmHg (12/22 0755) SpO2:  [96 %-100 %] 98 % (12/22 0755) FiO2 (%):  [2 %] 2 % (12/21 1915) Weight:  [220 lb 0.3 oz (99.8 kg)] 220 lb 0.3 oz (99.8 kg) (12/21 1915)    Intake/Output from previous day: 12/21 0701 - 12/22 0700 In: 2590 [I.V.:2100; IV Piggyback:250] Out: 1330 [Urine:300; Emesis/NG output:100; Drains:280; Blood:650] Intake/Output this shift: Total I/O In: -  Out: 125 [Urine:25; Drains:100]  Lungs: clear to auscultation  Abd: Flat, binder in place.  No bowel sounds  Extremities: No DVT signs or symptoms.  Neuro: Intact  Lab Results: CBC   Basename 01/03/11 0425 01/02/11 0832  WBC 21.6* --  HGB 10.8* 12.2*  HCT 32.0* 36.0*  PLT 238 --   BMET  Basename 01/03/11 0425 01/02/11 0832  NA 134* 138  K 5.4* 4.4  CL 97 --  CO2 19 --  GLUCOSE 170* 73  BUN 42* --  CREATININE 7.31* --  CALCIUM 9.4 --   PT/INR No results found for this basename: LABPROT:2,INR:2 in the last 72 hours ABG No results found for this basename: PHART:2,PCO2:2,PO2:2,HCO3:2 in the last 72 hours  Studies/Results: No results found.  Anti-infectives: Anti-infectives     Start     Dose/Rate Route Frequency Ordered Stop   01/02/11 1030   cefoTEtan (CEFOTAN) 2 g in dextrose 5 % 50 mL IVPB  Status:  Discontinued        2 g 100 mL/hr over 30 Minutes Intravenous To Surgery 01/02/11 1019 01/02/11 1917          Assessment/Plan: s/p Procedure(s): COLOSTOMY CLOSURE HERNIA REPAIR VENTRAL ADULT Continue foley due to Continue foley due to Urinary output monitoring Spray for throat. Keep in  SDU.  LOS: 1 day    Marta Lamas. Gae Bon, MD, FACS [phone] [facility] 01/03/2011

## 2011-01-04 ENCOUNTER — Inpatient Hospital Stay (HOSPITAL_COMMUNITY): Payer: BC Managed Care – PPO

## 2011-01-04 LAB — DIFFERENTIAL
Eosinophils Absolute: 0 10*3/uL (ref 0.0–0.7)
Lymphs Abs: 2 10*3/uL (ref 0.7–4.0)
Monocytes Absolute: 1.5 10*3/uL — ABNORMAL HIGH (ref 0.1–1.0)
Monocytes Relative: 8 % (ref 3–12)
Neutro Abs: 15.8 10*3/uL — ABNORMAL HIGH (ref 1.7–7.7)
Neutrophils Relative %: 82 % — ABNORMAL HIGH (ref 43–77)

## 2011-01-04 LAB — BASIC METABOLIC PANEL
BUN: 60 mg/dL — ABNORMAL HIGH (ref 6–23)
Chloride: 100 mEq/L (ref 96–112)
Glucose, Bld: 103 mg/dL — ABNORMAL HIGH (ref 70–99)
Potassium: 5.6 mEq/L — ABNORMAL HIGH (ref 3.5–5.1)

## 2011-01-04 LAB — CBC
HCT: 24.4 % — ABNORMAL LOW (ref 39.0–52.0)
Hemoglobin: 8.4 g/dL — ABNORMAL LOW (ref 13.0–17.0)
MCH: 31.2 pg (ref 26.0–34.0)
RBC: 2.69 MIL/uL — ABNORMAL LOW (ref 4.22–5.81)

## 2011-01-04 MED ORDER — DARBEPOETIN ALFA-POLYSORBATE 200 MCG/0.4ML IJ SOLN
200.0000 ug | Freq: Once | INTRAMUSCULAR | Status: AC
  Administered 2011-01-04: 200 ug via INTRAVENOUS

## 2011-01-04 MED ORDER — DARBEPOETIN ALFA-POLYSORBATE 200 MCG/0.4ML IJ SOLN
INTRAMUSCULAR | Status: AC
  Filled 2011-01-04: qty 0.4

## 2011-01-04 NOTE — Progress Notes (Signed)
AT 1530 called to pt room, pt c/o not being able to breath, observed to be coughing with sercretions in trash, observed NGT curled in back of throat, NGT d/c for airway clearance, MD/N new orders received
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
## 2011-01-04 NOTE — Procedures (Signed)
I was present at this session.  I have reviewed the session itself and made appropriate changes.  Aldrick Derrig K. 12/23/20129:51 AM

## 2011-01-04 NOTE — Progress Notes (Signed)
2 Days Post-Op  Subjective: Pt ok, foley out, voiding on own. No flatus yet Pain control ok  Objective: Vital signs in last 24 hours: Temp:  [97.1 F (36.2 C)-98.6 F (37 C)] 97.6 F (36.4 C) (12/23 1020) Pulse Rate:  [88-99] 94  (12/23 1020) Resp:  [9-22] 15  (12/23 1020) BP: (112-140)/(68-94) 136/76 mmHg (12/23 1020) SpO2:  [94 %-100 %] 94 % (12/23 1020) FiO2 (%):  [0 %] 0 % (12/23 0400) Weight:  [220 lb 3.8 oz (99.9 kg)-222 lb 3.6 oz (100.8 kg)] 222 lb 3.6 oz (100.8 kg) (12/23 0640)    Intake/Output this shift:    Physical Exam: BP 136/76  Pulse 94  Temp(Src) 97.6 F (36.4 C) (Oral)  Resp 15  Ht 6' (1.829 m)  Wt 222 lb 3.6 oz (100.8 kg)  BMI 30.14 kg/m2  SpO2 94% Abdomen: incisions all c/d/i, dressings changed. Drains intact, SS output. Quiet BS  Labs: CBC  Basename 01/04/11 0714 01/03/11 0425  WBC 19.3* 21.6*  HGB 8.4* 10.8*  HCT 24.4* 32.0*  PLT 219 238   BMET  Basename 01/04/11 0714 01/03/11 1819 01/03/11 0425  NA 137 -- 134*  K 5.6* 5.4* --  CL 100 -- 97  CO2 24 -- 19  GLUCOSE 103* -- 170*  BUN 60* -- 42*  CREATININE 9.76* -- 7.31*  CALCIUM 9.6 -- 9.4   LFT  Basename 01/03/11 0425  PROT --  ALBUMIN 3.3*  AST --  ALT --  ALKPHOS --  BILITOT --  BILIDIR --  IBILI --  LIPASE --   PT/INR No results found for this basename: LABPROT:2,INR:2 in the last 72 hours ABG No results found for this basename: PHART:2,PCO2:2,PO2:2,HCO3:2 in the last 72 hours  Studies/Results: No results found.  Assessment: Active Problems:  * No active hospital problems. *    Procedure(s): COLOSTOMY CLOSURE HERNIA REPAIR VENTRAL ADULT  Plan: COntinue NG until increased bowel function Increase activity.  LOS: 2 days    Iona Coach 01/04/2011

## 2011-01-05 LAB — CBC
Hemoglobin: 6.9 g/dL — CL (ref 13.0–17.0)
MCH: 30.7 pg (ref 26.0–34.0)
RBC: 2.25 MIL/uL — ABNORMAL LOW (ref 4.22–5.81)

## 2011-01-05 LAB — BASIC METABOLIC PANEL
CO2: 25 mEq/L (ref 19–32)
Calcium: 9.2 mg/dL (ref 8.4–10.5)
Glucose, Bld: 80 mg/dL (ref 70–99)
Sodium: 140 mEq/L (ref 135–145)

## 2011-01-05 NOTE — Progress Notes (Signed)
GS Progress Note/POD #3 Subjective: Patient sitting up at bedside, complaining of moderate left upper quadrant abdominal and back pain.  Has not bee using PCA very much  Objective: Vital signs in last 24 hours: Temp:  [97.6 F (36.4 C)-99.4 F (37.4 C)] 99.1 F (37.3 C) (12/24 0400) Pulse Rate:  [90-112] 97  (12/24 0400) Resp:  [9-23] 14  (12/24 0400) BP: (113-140)/(57-78) 128/61 mmHg (12/24 0400) SpO2:  [93 %-100 %] 100 % (12/24 0400) Weight:  [221 lb 12.5 oz (100.6 kg)] 221 lb 12.5 oz (100.6 kg) (12/23 1020)    Intake/Output from previous day: 12/23 0701 - 12/24 0700 In: 1180 [I.V.:1150; NG/GT:30] Out: 415 [Emesis/NG output:100; Drains:315] Intake/Output this shift:    Lungs: Clear to auscultation  Abd: Soft, hypoactive bowel sounds.  Binder in place  Extremities: No chages  Neuro: Intact  Lab Results: CBC   Basename 01/04/11 0714 01/03/11 0425  WBC 19.3* 21.6*  HGB 8.4* 10.8*  HCT 24.4* 32.0*  PLT 219 238   BMET  Basename 01/04/11 0714 01/03/11 1819 01/03/11 0425  NA 137 -- 134*  K 5.6* 5.4* --  CL 100 -- 97  CO2 24 -- 19  GLUCOSE 103* -- 170*  BUN 60* -- 42*  CREATININE 9.76* -- 7.31*  CALCIUM 9.6 -- 9.4   PT/INR No results found for this basename: LABPROT:2,INR:2 in the last 72 hours ABG No results found for this basename: PHART:2,PCO2:2,PO2:2,HCO3:2 in the last 72 hours  Studies/Results: No results found.  Anti-infectives: Anti-infectives     Start     Dose/Rate Route Frequency Ordered Stop   01/02/11 1030   cefoTEtan (CEFOTAN) 2 g in dextrose 5 % 50 mL IVPB  Status:  Discontinued        2 g 100 mL/hr over 30 Minutes Intravenous To Surgery 01/02/11 1019 01/02/11 1917          Assessment/Plan: s/p Procedure(s): COLOSTOMY CLOSURE HERNIA REPAIR VENTRAL ADULT d/c foley Advance diet Ice chips only Control pain and keep in SDU.  LOS: 3 days    Marta Lamas. Gae Bon, MD, FACS [phone] [facility] Surgery 01/05/2011

## 2011-01-05 NOTE — Progress Notes (Signed)
Patient ID: John Amber Sr., male   DOB: [birth year], 60 y.o.   MRN: [id number]   OKAY TO ACCESS HEMODIALYSIS CATHETER FOR BLOOD DRAWS BY IV TEAM.  Zetta Bills MD [facility]. Office # [phone] Pager # [phone] 12:48 PM

## 2011-01-05 NOTE — Progress Notes (Signed)
Patient ID: John Amber Sr., male   DOB: [birth year], 60 y.o.   MRN: [id number]   S: Reports to be doing fairly, struggling with some abdominal pain. He has not passed any flatus or bowel movement. He is happy that nasogastric tube was taken out yesterday.  O: BP 119/66  Pulse 93  Temp(Src) 98.5 F (36.9 C) (Oral)  Resp 14  Ht 6' (1.829 m)  Wt 100.6 kg (221 lb 12.5 oz)  BMI 30.08 kg/m2  SpO2 98%  NGE:[id number] resting on side of bed UVO:ZDGUY RRR, S1 and S2 with ESM Resp:CTA bilaterally, no rales/rhonchi Abd: Tender on exam with very scant bowel sounds. Ext:No LE edema       . HYDROmorphone PCA 0.3 mg/mL   Intravenous Q4H    BMET  Lab 01/04/11 0714 01/03/11 1819 01/03/11 0425 01/02/11 0832  NA 137 -- 134* 138  K 5.6* 5.4* 5.4* 4.4  CL 100 -- 97 --  CO2 24 -- 19 --  GLUCOSE 103* -- 170* 73  BUN 60* -- 42* --  CREATININE 9.76* -- 7.31* --  ALB -- -- -- --  CALCIUM 9.6 -- 9.4 --  PHOS -- -- 8.5* --   CBC  Lab 01/04/11 0714 01/03/11 0425 01/02/11 0832  WBC 19.3* 21.6* --  NEUTROABS 15.8* -- --  HGB 8.4* 10.8* 12.2*  HCT 24.4* 32.0* 36.0*  MCV 90.7 92.2 --  PLT 219 238 --    Assessment/Plan:  1. ESRD - tolerated dialysis well yesterday, no compelling indication for dialysis today. We'll continue to monitor him closely for emergent needs. Will check a potassium level today given predialysis hyperkalemia yesterday.  2. S/p colostomy reversal/ventral hernia repair - per Dr. Lindie Spruce on 12/21. Appears to be still having postoperative ileus, management per surgery. 3. Anemia - Hgb dropping to 8.4 from 10.8 s/p surgery. Will give Aranesp 200 mcg today.  4. Secondary hyperparathyroidism - on Tums as binder, Zemplar per HD.  5. Nutrition - NPO at this time while he is having ileus. Anticipate need for parenteral nutrition if n.p.o. status prolonged. This unfortunately may lead to more frequent dialysis given volume.   Stephanne Greeley K.

## 2011-01-05 NOTE — Progress Notes (Signed)
   CARE MANAGEMENT NOTE 01/05/2011  Patient:  [name], [name]   Account Number:  [id number]  Date Initiated:  01/05/2011  Documentation initiated by:  Carlyle Lipa  Subjective/Objective Assessment:   reversal of colostomy; hernia repair     Action/Plan:   home with wife to assist when medically stable   Anticipated DC Date:  01/12/2011   Anticipated DC Plan:  HOME/SELF CARE      DC Planning Services  CM consult      Status of service:  In process, will continue to follow  Per UR Regulation:  Reviewed for med. necessity/level of care/duration of stay

## 2011-01-05 NOTE — Progress Notes (Signed)
CRITICAL VALUE ALERT  Critical value received:  Hgb  Date of notification: 01/05/11  Time of notification:  1205  Critical value read back:yes  Nurse who received alert: Oscar La  MD notified (1st page):  Janee Morn  Time of first page:  1208  MD notified (2nd page):  Time of second page:  Responding MD:  Janee Morn  Time MD responded:  1210

## 2011-01-06 LAB — HEMOGLOBIN AND HEMATOCRIT, BLOOD
HCT: 22.6 % — ABNORMAL LOW (ref 39.0–52.0)
Hemoglobin: 7.5 g/dL — ABNORMAL LOW (ref 13.0–17.0)

## 2011-01-06 NOTE — Progress Notes (Signed)
4 Days Post-Op  Subjective: No complaints. No nausea  Objective: Vital signs in last 24 hours: Temp:  [98 F (36.7 C)-99.1 F (37.3 C)] 98.1 F (36.7 C) (12/25 0800) Pulse Rate:  [88-105] 95  (12/25 0800) Resp:  [13-20] 13  (12/25 0800) BP: (112-122)/(60-71) 122/68 mmHg (12/25 0800) SpO2:  [92 %-99 %] 92 % (12/25 0800)    Intake/Output from previous day: 12/24 0701 - 12/25 0700 In: 1462.5 [I.V.:1150; Blood:312.5] Out: 210 [Urine:50; Drains:160] Intake/Output this shift: Total I/O In: -  Out: 20 [Drains:20]  GI: soft, nontender. good bowel sounds. incision ok  Lab Results:   Basename 01/06/11 0227 01/05/11 1149 01/04/11 0714  WBC -- 15.3* 19.3*  HGB 7.5* 6.9* --  HCT 22.6* 20.8* --  PLT -- 197 219   BMET  Basename 01/05/11 1149 01/04/11 0714  NA 140 137  K 4.1 5.6*  CL 102 100  CO2 25 24  GLUCOSE 80 103*  BUN 42* 60*  CREATININE 7.68* 9.76*  CALCIUM 9.2 9.6   PT/INR No results found for this basename: LABPROT:2,INR:2 in the last 72 hours ABG No results found for this basename: PHART:2,PCO2:2,PO2:2,HCO3:2 in the last 72 hours  Studies/Results: No results found.  Anti-infectives: Anti-infectives     Start     Dose/Rate Route Frequency Ordered Stop   01/02/11 1030   cefoTEtan (CEFOTAN) 2 g in dextrose 5 % 50 mL IVPB  Status:  Discontinued        2 g 100 mL/hr over 30 Minutes Intravenous To Surgery 01/02/11 1019 01/02/11 1917          Assessment/Plan: s/p Procedure(s): COLOSTOMY CLOSURE HERNIA REPAIR VENTRAL ADULT Advance diet Clear liquids  LOS: 4 days    TOTH III,Cephas Revard S 01/06/2011

## 2011-01-06 NOTE — Progress Notes (Signed)
Patient ID: John Amber Sr., male   DOB: [birth year], 60 y.o.   MRN: [id number]   S: Reports a better night last night and this morning with minimal abdominal pain. He has still not passed flatus or had a bowel movement. He does report continuing to pass urine.   O: BP 122/70  Pulse 105  Temp(Src) 98 F (36.7 C) (Oral)  Resp 17  Ht 6' (1.829 m)  Wt 100.6 kg (221 lb 12.5 oz)  BMI 30.08 kg/m2  SpO2 93%  FAO:ZHYQMVHQION sleeping in bed. Watching television and tolerating sips of ice water. GEX:BMWUX RRR, Normal S1 with loud S2 Resp:CTA bilaterally, no rales/rhonchi LKG:MWNU, distended, mildly tender diffusely, BS audible more prominently than yesterday Ext:No LE edema.       Marland Kitchen HYDROmorphone PCA 0.3 mg/mL   Intravenous Q4H    BMET  Lab 01/05/11 1149 01/04/11 0714 01/03/11 1819 01/03/11 0425 01/02/11 0832  NA 140 137 -- 134* 138  K 4.1 5.6* 5.4* 5.4* 4.4  CL 102 100 -- 97 --  CO2 25 24 -- 19 --  GLUCOSE 80 103* -- 170* 73  BUN 42* 60* -- 42* --  CREATININE 7.68* 9.76* -- 7.31* --  ALB -- -- -- -- --  CALCIUM 9.2 9.6 -- 9.4 --  PHOS -- -- -- 8.5* --   CBC  Lab 01/06/11 0227 01/05/11 1149 01/04/11 0714 01/03/11 0425  WBC -- 15.3* 19.3* 21.6*  NEUTROABS -- -- 15.8* --  HGB 7.5* 6.9* 8.4* 10.8*  HCT 22.6* 20.8* 24.4* 32.0*  MCV -- 92.4 90.7 92.2  PLT -- 197 219 238    Assessment/Plan:  1. ESRD - No compelling indication for dialysis today. We'll continue to monitor him closely for emergent needs. Will check a potassium level today given blood transfusion yesterday.  2. S/p colostomy reversal/ventral hernia repair - per Dr. Lindie Spruce on 12/21. Appears to be still having postoperative ileus however bowel sounds are now more prominent and he has the sensation that he may need to pass a bowel movement, further management per surgery.  3. Anemia - he is on Aranesp at maximal dose, status post packed red cell transfusion yesterday. Hemoglobin still low and borderline tachycardia  noted. No large overt losses reported.  4. Secondary hyperparathyroidism - on Tums as binder, Zemplar per HD.  5. Nutrition - NPO at this time while he is having ileus. Anticipate need for parenteral nutrition if n.p.o. status prolonged. This unfortunately may lead to more frequent dialysis given volume.   Jerricka Carvey K.

## 2011-01-07 LAB — TYPE AND SCREEN
ABO/RH(D): A NEG
Antibody Screen: NEGATIVE

## 2011-01-07 LAB — BASIC METABOLIC PANEL
Chloride: 101 mEq/L (ref 96–112)
GFR calc Af Amer: 6 mL/min — ABNORMAL LOW (ref >90)
Potassium: 4 mEq/L (ref 3.5–5.1)
Sodium: 139 mEq/L (ref 135–145)

## 2011-01-07 MED ORDER — HYDROCODONE-ACETAMINOPHEN 5-325 MG PO TABS
1.0000 | ORAL_TABLET | Freq: Four times a day (QID) | ORAL | Status: DC | PRN
  Administered 2011-01-07 (×3): 2 via ORAL
  Administered 2011-01-08: 1 via ORAL
  Filled 2011-01-07 (×4): qty 2

## 2011-01-07 MED ORDER — ALBUTEROL SULFATE (5 MG/ML) 0.5% IN NEBU
2.5000 mg | INHALATION_SOLUTION | RESPIRATORY_TRACT | Status: DC | PRN
  Administered 2011-01-07 – 2011-01-08 (×2): 2.5 mg via RESPIRATORY_TRACT
  Filled 2011-01-07 (×2): qty 0.5

## 2011-01-07 NOTE — Progress Notes (Signed)
Patient ID: John Amber Sr., male   DOB: 07-Oct-1950, 60 y.o.   MRN: [id number]  S: Tolerated clear liquids overnight and this passed some flatus. Still has not had a bowel movement and has minimal abdominal pain.   O: BP 129/71  Pulse 100  Temp(Src) 99.7 F (37.6 C) (Oral)  Resp 17  Ht 6' (1.829 m)  Wt 100.6 kg (221 lb 12.5 oz)  BMI 30.08 kg/m2  SpO2 92%  WUJ:WJXBJYNWGNF resting in bed AOZ:HYQMV regular tachycardia, S1 and S2 with ESM Resp:CTA bilaterally, no rales/rhonchi HQI:ONGE, intact staples over both the colostomy closure site and midline abdominal incision site. Bowel sounds normal. Ext: No edema over lower extremities.      Marland Kitchen DISCONTD: HYDROmorphone PCA 0.3 mg/mL   Intravenous Q4H    BMET  Lab 01/05/11 1149 01/04/11 0714 01/03/11 1819 01/03/11 0425 01/02/11 0832  NA 140 137 -- 134* 138  K 4.1 5.6* 5.4* 5.4* 4.4  CL 102 100 -- 97 --  CO2 25 24 -- 19 --  GLUCOSE 80 103* -- 170* 73  BUN 42* 60* -- 42* --  CREATININE 7.68* 9.76* -- 7.31* --  ALB -- -- -- -- --  CALCIUM 9.2 9.6 -- 9.4 --  PHOS -- -- -- 8.5* --   CBC  Lab 01/06/11 0227 01/05/11 1149 01/04/11 0714 01/03/11 0425  WBC -- 15.3* 19.3* 21.6*  NEUTROABS -- -- 15.8* --  HGB 7.5* 6.9* 8.4* 10.8*  HCT 22.6* 20.8* 24.4* 32.0*  MCV -- 92.4 90.7 92.2  PLT -- 197 219 238    Assessment/Plan:  1. ESRD - will check labs today in order to determine whether there is any electrolyte indications for dialysis. Clinically, he seems to be doing fine and I will plan for dialyzing tomorrow if not acutely needed today. As an outpatient, he gets dialysis twice a week due to good urine output and what appears to be recovering renal function. 2. S/p colostomy reversal/ventral hernia repair - per Dr. Lindie Spruce on 12/21. Tolerated clear liquids overnight and anticipate progression to full liquid diet today. Spoke to surgical PA, they're planning on transfer down to a regular/telemetry bed up in 6700. 3. Anemia - he is on  Aranesp at maximal dose, status post packed red cell transfusion on Saturday. We'll monitor his hemoglobin trend for transfusion needs. 4. Secondary hyperparathyroidism - on Tums as binder, Zemplar per HD.  5. Nutrition - now on clear liquids, anticipate progression.   Xylina Rhoads K.

## 2011-01-07 NOTE — Progress Notes (Signed)
I have seen and examined the patient and agree with the assessment and plans.  Efraim Vanallen A. Lasean Gorniak  MD, FACS  

## 2011-01-07 NOTE — Progress Notes (Signed)
5 Days Post-Op  Subjective: Tolerating clear liquids well. Passing flatus. Urinating some per usual pattern prior to admit.  Objective: Vital signs in last 24 hours: Temp:  [98.1 F (36.7 C)-99.7 F (37.6 C)] 99.7 F (37.6 C) (12/26 0342) Pulse Rate:  [94-100] 100  (12/26 0342) Resp:  [13-18] 17  (12/26 0400) BP: (122-129)/(65-74) 129/71 mmHg (12/26 0342) SpO2:  [92 %-98 %] 92 % (12/26 0400)    Intake/Output from previous day: 12/25 0701 - 12/26 0700 In: 740 [P.O.:240; I.V.:500] Out: 98 [Drains:98] Intake/Output this shift:    General appearance: alert, cooperative and no distress Resp: clear to auscultation bilaterally Cardio: regular rate and rhythm GI: soft, non-tender; bowel sounds normal; no masses,  no organomegaly  Midline incision healing well with staples intact, C/D  JP drains- #1 with small amout of serosang drainage    #2 with scant/ no drainage  Lab Results:   Basename 01/06/11 0227 01/05/11 1149  WBC -- 15.3*  HGB 7.5* 6.9*  HCT 22.6* 20.8*  PLT -- 197   BMET  Basename 01/05/11 1149  NA 140  K 4.1  CL 102  CO2 25  GLUCOSE 80  BUN 42*  CREATININE 7.68*  CALCIUM 9.2    Studies/Results: No results found.  Anti-infectives: Anti-infectives     Start     Dose/Rate Route Frequency Ordered Stop   01/02/11 1030   cefoTEtan (CEFOTAN) 2 g in dextrose 5 % 50 mL IVPB  Status:  Discontinued        2 g 100 mL/hr over 30 Minutes Intravenous To Surgery 01/02/11 1019 01/02/11 1917          Assessment/Plan: s/p Procedure(s): COLOSTOMY CLOSURE HERNIA REPAIR VENTRAL ADULT  . S/p colostomy reversal/ventral hernia repair - per Dr. Lindie Spruce on 12/21.POD #5- ileus resolving  Will advance to Full liquids, okay for transfer to floor .ESRD- Per renal MD . Anemia - S/P transfusion 12/24, will re-check lab today . Secondary hyperparathyroidism - Per renal . Nutrition - advance diet to full liquids, decrease MIVF .DISPO- Transfer to floor 6700 with pt  continuing HD a couple of times/wk   LOS: 5 days   John Sawdey,PA-C Pager [phone] General Trauma Pager [phone]

## 2011-01-07 NOTE — Progress Notes (Signed)
Pt TX to 6714,  VSS. D/c PCA, wasted 15ml Dilaudid. Witness Oscar La, RN.

## 2011-01-08 ENCOUNTER — Inpatient Hospital Stay (HOSPITAL_COMMUNITY): Payer: BC Managed Care – PPO

## 2011-01-08 ENCOUNTER — Encounter (HOSPITAL_COMMUNITY): Payer: Self-pay | Admitting: General Surgery

## 2011-01-08 DIAGNOSIS — N186 End stage renal disease: Secondary | ICD-10-CM | POA: Diagnosis present

## 2011-01-08 DIAGNOSIS — E209 Hypoparathyroidism, unspecified: Secondary | ICD-10-CM | POA: Insufficient documentation

## 2011-01-08 DIAGNOSIS — Z8719 Personal history of other diseases of the digestive system: Secondary | ICD-10-CM

## 2011-01-08 DIAGNOSIS — Z992 Dependence on renal dialysis: Secondary | ICD-10-CM | POA: Diagnosis present

## 2011-01-08 DIAGNOSIS — D631 Anemia in chronic kidney disease: Secondary | ICD-10-CM | POA: Diagnosis present

## 2011-01-08 LAB — GLUCOSE, CAPILLARY: Glucose-Capillary: 93 mg/dL (ref 70–99)

## 2011-01-08 LAB — RENAL FUNCTION PANEL
BUN: 71 mg/dL — ABNORMAL HIGH (ref 6–23)
Chloride: 104 mEq/L (ref 96–112)
GFR calc Af Amer: 6 mL/min — ABNORMAL LOW (ref >90)
Glucose, Bld: 85 mg/dL (ref 70–99)
Potassium: 4.4 mEq/L (ref 3.5–5.1)

## 2011-01-08 LAB — CBC
HCT: 22.3 % — ABNORMAL LOW (ref 39.0–52.0)
Hemoglobin: 7.4 g/dL — ABNORMAL LOW (ref 13.0–17.0)
RBC: 2.37 MIL/uL — ABNORMAL LOW (ref 4.22–5.81)
WBC: 11.1 10*3/uL — ABNORMAL HIGH (ref 4.0–10.5)

## 2011-01-08 LAB — POCT I-STAT 4, (NA,K, GLUC, HGB,HCT)
Glucose, Bld: 84 mg/dL (ref 70–99)
HCT: 30 % — ABNORMAL LOW (ref 39.0–52.0)
Hemoglobin: 10.2 g/dL — ABNORMAL LOW (ref 13.0–17.0)
Sodium: 139 mEq/L (ref 135–145)

## 2011-01-08 MED ORDER — DARBEPOETIN ALFA-POLYSORBATE 200 MCG/0.4ML IJ SOLN
200.0000 ug | INTRAMUSCULAR | Status: DC
  Administered 2011-01-08: 200 ug via INTRAVENOUS

## 2011-01-08 MED ORDER — DARBEPOETIN ALFA-POLYSORBATE 200 MCG/0.4ML IJ SOLN
INTRAMUSCULAR | Status: AC
  Filled 2011-01-08: qty 0.4

## 2011-01-08 MED ORDER — POLYETHYLENE GLYCOL 3350 17 G PO PACK
17.0000 g | PACK | Freq: Every day | ORAL | Status: DC
  Administered 2011-01-08 – 2011-01-09 (×2): 17 g via ORAL
  Filled 2011-01-08 (×4): qty 1

## 2011-01-08 MED ORDER — DOCUSATE SODIUM 100 MG PO CAPS
100.0000 mg | ORAL_CAPSULE | Freq: Two times a day (BID) | ORAL | Status: DC
  Administered 2011-01-08 – 2011-01-09 (×4): 100 mg via ORAL
  Filled 2011-01-08 (×6): qty 1

## 2011-01-08 MED ORDER — HYDROCODONE-ACETAMINOPHEN 5-325 MG PO TABS
ORAL_TABLET | ORAL | Status: AC
  Administered 2011-01-08: 1 via ORAL
  Filled 2011-01-08: qty 1

## 2011-01-08 MED ORDER — TRAMADOL HCL 50 MG PO TABS
100.0000 mg | ORAL_TABLET | Freq: Two times a day (BID) | ORAL | Status: DC
  Administered 2011-01-08 – 2011-01-09 (×3): 100 mg via ORAL
  Filled 2011-01-08 (×6): qty 2

## 2011-01-08 MED ORDER — HYDROCODONE-ACETAMINOPHEN 5-325 MG PO TABS
0.5000 | ORAL_TABLET | ORAL | Status: DC | PRN
  Administered 2011-01-08 – 2011-01-09 (×5): 1 via ORAL
  Filled 2011-01-08: qty 2
  Filled 2011-01-08 (×4): qty 1

## 2011-01-08 NOTE — Progress Notes (Signed)
I have personally attended this patient's dialysis session.  Access is permcath, blood flow rate400. Blood pressure at present is 118/74.  UF so far 627 ml  Mls.  Patient is usually 2X/week on Monday/ Friday; if discharged on the weekend will HD on Sunday; if not - will probably hold off until Monday to avoid excessive intradialytic interval (thurs - sun - fri)   Trebor Galdamez B

## 2011-01-08 NOTE — Progress Notes (Signed)
Patient ID: John Amber Sr., male   DOB: May 31, 1950, 60 y.o.   MRN: [id number]   LOS: 6 days  POD#6  Subjective: Doing well. No N/V. Pain meds knock him out then seem to wear off too quickly. In dialysis.  Objective: Vital signs in last 24 hours: Temp:  [97.2 F (36.2 C)-98.9 F (37.2 C)] 97.8 F (36.6 C) (12/27 0735) Pulse Rate:  [85-99] 88  (12/27 0735) Resp:  [12-20] 14  (12/27 0735) BP: (115-136)/(67-82) 115/71 mmHg (12/27 0735) SpO2:  [92 %-97 %] 95 % (12/27 0735) Weight:  [102 kg (224 lb 13.9 oz)] 224 lb 13.9 oz (102 kg) (12/27 0735)   Drain#1: 55ml/24h Drain#2: 1110ml/24h   General appearance: alert and no distress Resp: clear to auscultation bilaterally Cardio: regular rate and rhythm GI: normal findings: bowel sounds normal and appropriately TTP. Incisions C/D/I.  Assessment/Plan: S/p colostomy takedown/VHR ESRD Chronic anemia Hyperparathyroidism FEN -- Ileus resolved, advance diet to regular VTE -- SCD's Dispo -- Will adjust pain medication. Likely home tomorrow. Said he had some difficulty getting to [facility], will get PT/OT evals.     Freeman Caldron, PA-C Pager: [phone] General Trauma PA Pager: [phone]   01/08/2011  Pt seen and examined. +BM and flatus.  C/o "soreness".  His abdomen looks appropriate and wound okay.

## 2011-01-08 NOTE — Progress Notes (Signed)
Subjective:  Seen on HD, no current complaints, full liquid diet beginning today, no BM, but passing flatus; ate some grits without problem  Objective: Vital signs in last 24 hours: Temp:  [97.2 F (36.2 C)-98.6 F (37 C)] 97.8 F (36.6 C) (12/27 0735) Pulse Rate:  [85-99] 88  (12/27 0800) Resp:  [14-20] 16  (12/27 0800) BP: (115-136)/(67-80) 115/68 mmHg (12/27 0800) SpO2:  [94 %-97 %] 95 % (12/27 0735) Weight:  [102 kg (224 lb 13.9 oz)] 224 lb 13.9 oz (102 kg) (12/27 0735) Weight change:   Intake/Output from previous day: 12/26 0701 - 12/27 0700 In: 780 [P.O.:780] Out: 360 [Urine:200; Drains:160]    . darbepoetin (ARANESP) injection - DIALYSIS  200 mcg Intravenous Q Thu-HD  . docusate sodium  100 mg Oral BID  . polyethylene glycol  17 g Oral Daily  . traMADol  100 mg Oral Q12H     EXAM: General appearance:  Alert, comfortable, in no apparent distress Resp:  CTA bilaterally Cardio:  RRR without murmur GI:  + BS, soft, s/p surgery; currently wearing abdominal binder   Extremities:  No edema Access:  R IJ catheter, AVF @ [facility] maturing (placed 10/16)   Lab Results:  Basename 01/08/11 0500 01/06/11 0227 01/05/11 1149  WBC 11.1* -- 15.3*  HGB 7.4* 7.5* --  HCT 22.3* 22.6* --  PLT 234 -- 197   BMET:  Basename 01/07/11 1010 01/05/11 1149  NA 139 140  K 4.0 4.1  CL 101 102  CO2 20 25  GLUCOSE 88 80  BUN 65* 42*  CREATININE 9.49* 7.68*  CALCIUM 9.5 9.2  ALBUMIN -- --   No results found for this basename: PTH:2 in the last 72 hours Iron Studies: No results found for this basename: IRON,TIBC,TRANSFERRIN,FERRITIN in the last 72 hours  Assessment/Plan: 1.   S/p Ventral hernia repair and reversal of colostomy - per Dr, Lindie Spruce on 12/21, advancing to full liquid diet no BM, but + flatus. 2.   ESRD - on HD on MF @ [facility], K stable @ 4, no signs or symptoms of fluid overload with UF goal of 1 L. 3.   Anemia - Hgb 7.5 today s/p transfusion on 12/22, on Aranesp 200  4.    Secondary hyperparathyroidism - on Tums as binder, Zemplar per HD.  5.   Nutrition - now on clear liquids, anticipate progression.    LOS: 6 days   LYLES,CHARLES 01/08/2011,8:35 AM  I have seen and examined this patient and agree with plan.  Patient currently on dialysis.  On holiday schedule next treatment would be on Sunday (usually Monday/Friday at [facility]) Yehia Mcbain B,MD 01/08/2011 9:40 AM

## 2011-01-08 NOTE — Plan of Care (Signed)
Problem: Phase I Progression Outcomes Goal: Voiding-avoid urinary catheter unless indicated Outcome: Completed/Met Date Met:  01/08/11 oliguric

## 2011-01-09 MED ORDER — TRAMADOL HCL 50 MG PO TABS
100.0000 mg | ORAL_TABLET | Freq: Three times a day (TID) | ORAL | Status: DC
  Administered 2011-01-09 (×2): 100 mg via ORAL
  Filled 2011-01-09 (×6): qty 2

## 2011-01-09 MED ORDER — BISACODYL 5 MG PO TBEC
5.0000 mg | DELAYED_RELEASE_TABLET | Freq: Every day | ORAL | Status: DC
  Administered 2011-01-09: 5 mg via ORAL
  Filled 2011-01-09: qty 1

## 2011-01-09 MED ORDER — BISACODYL 10 MG RE SUPP
10.0000 mg | Freq: Once | RECTAL | Status: AC
  Administered 2011-01-09: 10 mg via RECTAL
  Filled 2011-01-09: qty 1

## 2011-01-09 NOTE — Progress Notes (Signed)
Subjective:  No complaints, on solid food, no BM, but with flatus  Objective: Vital signs in last 24 hours: Temp:  [97.5 F (36.4 C)-98.8 F (37.1 C)] 98.5 F (36.9 C) (12/28 0500) Pulse Rate:  [77-92] 90  (12/28 0500) Resp:  [13-20] 18  (12/28 0500) BP: (102-121)/(56-76) 112/70 mmHg (12/28 0500) SpO2:  [94 %-98 %] 97 % (12/28 0500) Weight:  [100.1 kg (220 lb 10.9 oz)-100.6 kg (221 lb 12.5 oz)] 221 lb 12.5 oz (100.6 kg) (12/27 2105) Weight change:   Intake/Output from previous day: 12/27 0701 - 12/28 0700 In: 760 [P.O.:720] Out: 1465 [Urine:450; Drains:15] Scheduled Meds    . bisacodyl  5 mg Oral QHS  . bisacodyl  10 mg Rectal Once  . darbepoetin (ARANESP) injection - DIALYSIS  200 mcg Intravenous Q Thu-HD  . docusate sodium  100 mg Oral BID  . polyethylene glycol  17 g Oral Daily  . traMADol  100 mg Oral TID  . DISCONTD: traMADol  100 mg Oral Q12H     EXAM: General appearance:  Alert, comfortable, in no distress Resp:  CTA bilaterally Cardio:  RRR without murmur GI:  + BS, midabdominal incision Extremities:  No edema Access:  R IJ catheter, AVF @ R[facility] placed 10/16  Lab Results:  Basename 01/08/11 0500  WBC 11.1*  HGB 7.4*  HCT 22.3*  PLT 234   BMET:  Basename 01/08/11 0500 01/07/11 1010  NA 142 139  K 4.4 4.0  CL 104 101  CO2 23 20  GLUCOSE 85 88  BUN 71* 65*  CREATININE 9.89* 9.49*  CALCIUM 9.3 9.5  ALBUMIN 2.4* --   No results found for this basename: PTH:2 in the last 72 hours Iron Studies: No results found for this basename: IRON,TIBC,TRANSFERRIN,FERRITIN in the last 72 hours  Assessment/Plan: 1.  S/p Ventral hernia repair and reversal of colostomy - per Dr, Lindie Spruce on 12/21, advanced to solid foods, no BM, but + flatus. 2.  ESRD - on HD on MF @ [facility], stable s/p HD yesterday.  Next HD on 12/30 per holiday schedule. 3.  Anemia - Hgb 7.4 yesterday, s/p transfusion on 12/22, Aranesp 200 mcg yesterday.Check iron studies 4.  Secondary hyperparathyroidism  - on Tums as binder, Zemplar per HD. 5.  Nutrition - now on solid foods but no BM yet.     LOS: 7 days   LYLES,CHARLES 01/09/2011,7:55 AM   I agree with above with highlighted changes/additions Fiona Coto B,MD 01/09/2011 12:02 PM
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
## 2011-01-09 NOTE — Progress Notes (Signed)
Feels a little more bloated today. No n/v. +flatus. Some belching. States passing more gas than burping. C/o L sided rib pain - not chest pain.   Soft but a little firm.   Agree with suppositories. Anemia- stable.  Mary Sella. Andrey Campanile, MD, FACS General, Bariatric, & Minimally Invasive Surgery [facility], [place]

## 2011-01-09 NOTE — Progress Notes (Signed)
7 Days Post-Op  Subjective: Not eating very much. Abd is more distended today. He is passing flatus, but has not had a BM yet. He states he does not have an appetite right now.   Objective: Vital signs in last 24 hours: Temp:  [97.5 F (36.4 C)-98.8 F (37.1 C)] 98.5 F (36.9 C) (12/28 0500) Pulse Rate:  [77-91] 90  (12/28 0500) Resp:  [14-20] 18  (12/28 0500) BP: (102-121)/(56-76) 112/70 mmHg (12/28 0500) SpO2:  [94 %-98 %] 97 % (12/28 0500) Weight:  [220 lb 10.9 oz (100.1 kg)-221 lb 12.5 oz (100.6 kg)] 221 lb 12.5 oz (100.6 kg) (12/27 2105) Last BM Date:  (Pt doesn't know)  Intake/Output from previous day: 12/27 0701 - 12/28 0700 In: 760 [P.O.:720] Out: 1465 [Urine:450; Drains:15] Intake/Output this shift:    General appearance: alert, cooperative and mild distress Resp: clear to auscultation bilaterally Cardio: regular rate and rhythm GI: Abd is more distended today, but soft overall, decreased BS, midline incision is C/D/I JP Drains still with moderate serosang drainage. Lab Results:   Basename 01/08/11 0500  WBC 11.1*  HGB 7.4*  HCT 22.3*  PLT 234   BMET  Basename 01/08/11 0500 01/07/11 1010  NA 142 139  K 4.4 4.0  CL 104 101  CO2 23 20  GLUCOSE 85 88  BUN 71* 65*  CREATININE 9.89* 9.49*  CALCIUM 9.3 9.5      Anti-infectives: Anti-infectives     Start     Dose/Rate Route Frequency Ordered Stop   01/02/11 1030   cefoTEtan (CEFOTAN) 2 g in dextrose 5 % 50 mL IVPB  Status:  Discontinued        2 g 100 mL/hr over 30 Minutes Intravenous To Surgery 01/02/11 1019 01/02/11 1917          Assessment/Plan: s/p Procedure(s): COLOSTOMY CLOSURE HERNIA REPAIR VENTRAL ADULT . S/p colostomy reversal/ventral hernia repair - per Dr. Lindie Spruce on 12/21.POD #7- ileus resolved, but little appetite and has not had BM yet. Will try Dulcolax Supp only and add Dulcolax tabs  .ESRD- Per renal MD  . Anemia - S/P transfusion 12/24, follow up CBC in am . Secondary  hyperparathyroidism - Per renal  . Nutrition - continue Regular diet and encourage po intake .DISPO- Hopefully DC to home over next couple of days   LOS: 7 days    Wafa Martes 01/09/2011

## 2011-01-09 NOTE — Progress Notes (Signed)
Pt has expressed lack of appetite today. Pt also had one episode of vomiting this afternoon which appeared green with undigested food. Zofran was given IV and pt gained relief. Pt still has not had a bowel movement as of 16:40.

## 2011-01-10 MED ORDER — TRAMADOL HCL 50 MG PO TABS: 100.0000 mg | ORAL_TABLET | Freq: Four times a day (QID) | ORAL | Status: AC | PRN

## 2011-01-10 MED ORDER — DARBEPOETIN ALFA-POLYSORBATE 200 MCG/0.4ML IJ SOLN: 200.0000 ug | INTRAMUSCULAR | Status: DC

## 2011-01-10 MED ORDER — DSS 100 MG PO CAPS: 100.0000 mg | ORAL_CAPSULE | Freq: Two times a day (BID) | ORAL | Status: DC

## 2011-01-10 MED ORDER — BISACODYL 5 MG PO TBEC: 5.0000 mg | DELAYED_RELEASE_TABLET | Freq: Every day | ORAL | Status: DC

## 2011-01-10 MED ORDER — HYDROCODONE-ACETAMINOPHEN 5-325 MG PO TABS: 0.5000 | ORAL_TABLET | ORAL | Status: AC | PRN

## 2011-01-10 MED ORDER — POLYETHYLENE GLYCOL 3350 17 G PO PACK: 17.0000 g | PACK | Freq: Every day | ORAL | Status: AC

## 2011-01-10 NOTE — Progress Notes (Signed)
Subjective:  Feels good. Excited about going home today; no problems with HD yesterday. Objective Vital signs in last 24 hours: Filed Vitals:   01/09/11 1400 01/09/11 1800 01/09/11 2215 01/10/11 0538  BP: 108/68 111/72 132/79 125/75  Pulse: 87 88 95 89  Temp: 98 F (36.7 C) 98.5 F (36.9 C) 98.5 F (36.9 C) 98.7 F (37.1 C)  TempSrc: Oral Oral Oral Oral  Resp: 20 20 19 18   Height:      Weight:   100.699 kg (222 lb)   SpO2: 98% 96% 95% 95%   Weight change: -1.302 kg (-2 lb 13.9 oz)  Intake/Output Summary (Last 24 hours) at 01/10/11 0908 Last data filed at 01/09/11 2100  Gross per 24 hour  Intake    720 ml  Output    425 ml  Net    295 ml   Labs: Basic Metabolic Panel:  Lab 01/08/11 1324 01/07/11 1010 01/05/11 1149  NA 142 139 140  K 4.4 4.0 4.1  CL 104 101 102  CO2 23 20 25   GLUCOSE 85 88 80  BUN 71* 65* 42*  CREATININE 9.89* 9.49* 7.68*  CALCIUM 9.3 9.5 9.2  ALB -- -- --  PHOS 6.3* -- --   Liver Function Tests:  Lab 01/08/11 0500  AST --  ALT --  ALKPHOS --  BILITOT --  PROT --  ALBUMIN 2.4*  CBC:  Lab 01/08/11 0500 01/06/11 0227 01/05/11 1149 01/04/11 0714  WBC 11.1* -- 15.3* 19.3*  NEUTROABS -- -- -- 15.8*  HGB 7.4* 7.5* 6.9* --  HCT 22.3* 22.6* 20.8* --  MCV 94.1 -- 92.4 90.7  PLT 234 -- 197 219  CBG:  Lab 01/07/11 2128  GLUCAP 93  Medications:     . bisacodyl  5 mg Oral QHS  . bisacodyl  10 mg Rectal Once  . darbepoetin (ARANESP) injection - DIALYSIS  200 mcg Intravenous Q Thu-HD  . docusate sodium  100 mg Oral BID  . polyethylene glycol  17 g Oral Daily  . traMADol  100 mg Oral TID  . DISCONTD: traMADol  100 mg Oral Q12H    I  have reviewed scheduled and prn medications.  Physical Exam: General: Talkative; NAD Heart: RRR Lungs: grossly CTA Abdomen: large binder around abdomen - two J-P drains with bloody drainage Extremities: no edema  Problem/Plan: 1. S/p repair of ventral hernia and reversal of colostomy - doing well; having  BM and passing gas.Has outpt surgical followup on the 3rd; will go home with 2 drains in place which wife will empty. 2. ESRD - MWF - to return for next HD Sunday per holiday schedule; will notify his HD center 3. Anemia - on weekly Fe 100; ABLA - hgb 10.8 on 12/22; Received 200 ARanesp here; I will empirically increase IV Fe 100 x 5 and then weekly at d/c and we will need to check levels at his outpatient unit as well. Note - was not on any Epo PTA; will d/c on 15 K. 4. Secondary hyperparathyroidism - phos 6.3; need to resume binders - have advised patient to resume Tums 1000 AC; continue zemplar at d/.c 5. HTN/volume - BP lowish - ? Related to ABLA. Prior EDW 102. Change to 101 for d/c.    Sheffield Slider, PA-C [facility] [phone]  01/10/2011,9:08 AM  LOS: 8 days    I have seen and examined this patient and agree with plan.  He will resume outpt dialysis on his 2x/week program,  with next HD Sunday on holiday schedule. Danaysha Kirn B,MD 01/10/2011 10:11 AM

## 2011-01-10 NOTE — Discharge Summary (Signed)
I have seen and examined the patient and agree with the assessment and plans.  Stella Bortle A. Tasheba Henson  MD, FACS  

## 2011-01-10 NOTE — Progress Notes (Signed)
I have seen and examined the patient and agree with the assessment and plans.  Lurline Caver A. Merrick Maggio  MD, FACS  
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
## 2011-01-10 NOTE — Progress Notes (Signed)
8 Days Post-Op  Subjective: Doing better today. Abd less distended and had a BM.  Objective: Vital signs in last 24 hours: Temp:  [98 F (36.7 C)-98.7 F (37.1 C)] 98.7 F (37.1 C) (12/29 0538) Pulse Rate:  [87-95] 89  (12/29 0538) Resp:  [18-20] 18  (12/29 0538) BP: (108-132)/(65-79) 125/75 mmHg (12/29 0538) SpO2:  [95 %-98 %] 95 % (12/29 0538) Weight:  [222 lb (100.699 kg)] 222 lb (100.699 kg) (12/28 2215) Last BM Date: 01/09/11 (small bowel movement per report)  Intake/Output from previous day: 12/28 0701 - 12/29 0700 In: 960 [P.O.:960] Out: 690 [Urine:550; Drains:140] Intake/Output this shift:    General appearance: alert, cooperative and no distress Resp: clear to auscultation bilaterally Cardio: regular rate and rhythm GI: soft, non-tender; bowel sounds normal; no masses,  no organomegaly Incision/Wound:Midline incision is C/D/I, JP still with moderate amount of bloody drainage Lab Results:   Basename 01/08/11 0500  WBC 11.1*  HGB 7.4*  HCT 22.3*  PLT 234   BMET  Basename 01/08/11 0500 01/07/11 1010  NA 142 139  K 4.4 4.0  CL 104 101  CO2 23 20  GLUCOSE 85 88  BUN 71* 65*  CREATININE 9.89* 9.49*  CALCIUM 9.3 9.5    Studies/Results: No results found.  Anti-infectives: Anti-infectives     Start     Dose/Rate Route Frequency Ordered Stop   01/02/11 1030   cefoTEtan (CEFOTAN) 2 g in dextrose 5 % 50 mL IVPB  Status:  Discontinued        2 g 100 mL/hr over 30 Minutes Intravenous To Surgery 01/02/11 1019 01/02/11 1917          Assessment/Plan: s/p Procedure(s): COLOSTOMY CLOSURE HERNIA REPAIR VENTRAL ADULT . S/p colostomy reversal/ventral hernia repair - per Dr. Lindie Spruce on 12/21.POD #8- ileus resolved, tolerating Regular diet and having BM- Okay for DC to home today with drains in. Marland KitchenESRD- Per renal MD  . Anemia - S/P transfusion 12/24 . Secondary hyperparathyroidism - Per renal  . Nutrition - tolerating Reg diet .DISPO- DC to home today. Will ask  RN to teach drain care.  F/U in office 01/15/11   Discharge  LOS: 8 days    John Benecke,PA-C Pager [phone] General Trauma Pager [phone]

## 2011-01-15 ENCOUNTER — Encounter (INDEPENDENT_AMBULATORY_CARE_PROVIDER_SITE_OTHER): Payer: Self-pay

## 2011-01-15 ENCOUNTER — Ambulatory Visit (INDEPENDENT_AMBULATORY_CARE_PROVIDER_SITE_OTHER): Payer: BC Managed Care – PPO | Admitting: Orthopedic Surgery

## 2011-01-15 VITALS — BP 124/76 | HR 100 | Temp 98.2°F | Resp 18 | Ht 72.0 in | Wt 222.8 lb

## 2011-01-15 DIAGNOSIS — Z8719 Personal history of other diseases of the digestive system: Secondary | ICD-10-CM

## 2011-01-15 DIAGNOSIS — Z9889 Other specified postprocedural states: Secondary | ICD-10-CM

## 2011-01-15 NOTE — Patient Instructions (Signed)
No lifting greater than 5 pounds until February 1st.  Continue to record the amount of fluid in your drain.

## 2011-01-15 NOTE — Progress Notes (Signed)
Subjective No new c/o. Eating without difficulty. Drains fluctuate in amount.   Objective Drain #1: Serous. Drained 25ml in 14h. Drain #2: Bloody. Drained 25ml in 14h. Wounds: Well healed without dehiscence, erythema, discharge. Staples removed by CMA.   Assessment & Plan Will pull drain #1. Follow up with Dr. Lindie Spruce next week.

## 2011-01-20 ENCOUNTER — Telehealth (INDEPENDENT_AMBULATORY_CARE_PROVIDER_SITE_OTHER): Payer: Self-pay

## 2011-01-20 NOTE — Telephone Encounter (Signed)
Called patient and told him to contact PCP for sore throat.

## 2011-01-21 ENCOUNTER — Encounter: Payer: Self-pay | Admitting: Thoracic Diseases

## 2011-01-22 ENCOUNTER — Encounter: Payer: Self-pay | Admitting: Thoracic Diseases

## 2011-01-22 ENCOUNTER — Ambulatory Visit (INDEPENDENT_AMBULATORY_CARE_PROVIDER_SITE_OTHER): Payer: BC Managed Care – PPO | Admitting: Thoracic Diseases

## 2011-01-22 VITALS — BP 145/78 | HR 105 | Resp 20 | Ht 72.0 in | Wt 223.0 lb

## 2011-01-22 DIAGNOSIS — N186 End stage renal disease: Secondary | ICD-10-CM

## 2011-01-22 NOTE — Progress Notes (Signed)
[facility] [place]  Postoperative Visit hemodialysis access   Date of Surgery: 10/28/10 Right B-C AVF Nephrologist: Dr. Lowell Guitar HPI: John R Poynter Sr. is a 61 y.o. male who is 11 weeks S/P creation/revision of right upper extremity Hemodialysis access. The patient denies symptoms of numbness, tingling, weakness and denies pain in the operative limb. Patient is here for post -op evaluation to assess healing and maturation of right B-C AVF .  Pt is on hemodialysis through  right IJ on catheter placed by IR.  Physical Examination  Filed Vitals:   01/22/11 1447  BP: 145/78  Pulse: 105  Resp: 20    WDWN male in NAD.  right upper extremity Incision is healed Skin color is normal   Hand grip is 5/5 and sensation in digits is intact; There is a good thrill and good bruit in the AVF. The graft/fistula is easily palpable and of adequate size  Assessment/Plan John R Steele Sr. is a 61 y.o. year old who is s/p creation/revision of left upper extremity Hemodialysis access. Follow-up as needed  The patient's access will be ready for use on 01/30/2011.  Right IJ cath placed by IR  Clinic MD: Marily Lente

## 2011-01-23 ENCOUNTER — Encounter (INDEPENDENT_AMBULATORY_CARE_PROVIDER_SITE_OTHER): Payer: BC Managed Care – PPO

## 2011-01-23 ENCOUNTER — Encounter (INDEPENDENT_AMBULATORY_CARE_PROVIDER_SITE_OTHER): Payer: Self-pay | Admitting: General Surgery

## 2011-01-23 ENCOUNTER — Ambulatory Visit (INDEPENDENT_AMBULATORY_CARE_PROVIDER_SITE_OTHER): Payer: BC Managed Care – PPO | Admitting: General Surgery

## 2011-01-23 VITALS — BP 100/70 | HR 108 | Temp 98.0°F | Resp 25 | Ht 72.0 in | Wt 222.0 lb

## 2011-01-23 DIAGNOSIS — Z09 Encounter for follow-up examination after completed treatment for conditions other than malignant neoplasm: Secondary | ICD-10-CM | POA: Insufficient documentation

## 2011-01-23 NOTE — Progress Notes (Signed)
HPI The patient was doing extremely well status post complex ventral hernia repair with prosthetic mesh and colostomy takedown. He is having discomfort in his left perianal area area and some reflux early in the morning. He does not vomit he's maintaining his weight.  PE On examination the patient is doing very well. In his Harrison Mons drain that was remaining in the left lower quadrant he strained only approximately 25-30 cc of a Dark brown to brown liquid over the last 4 days.  Studiy review Cyurrently there are no studies to review.  Assessment Status post complex ventral hernia repair with colostomy takedown.  Plan I will see the patient again in 3 weeks.  The left breast tenderness if of unknown etiology.

## 2011-02-13 ENCOUNTER — Telehealth (INDEPENDENT_AMBULATORY_CARE_PROVIDER_SITE_OTHER): Payer: Self-pay

## 2011-02-13 NOTE — Telephone Encounter (Signed)
C/o  Lower left side pain- pain level 8- surgical area healing well- bowel movements soft- Patient would like pain medicine refilled- Ok per Dr. Lindie Spruce- Hydrocodone 5/325 protocol called to [facility] [phone].  Patient aware also he complains of occasional dry heaves when coughing. Which has been going on since surgery.

## 2011-02-13 NOTE — Telephone Encounter (Signed)
PA [facility] called stating pts hgb is staying around 7 to 8. [name] wants to know if this could be due to drain that is still in place. States drainage is dark, not red. I reviewed this with Dr Lindie Spruce and advised [name] that per Dr Lindie Spruce if drainage is less than 100cc a day and not bright red the drain is probably not the cause of low hgb and pt should be accessed by Kidney MD. [name] is to discuss the drainage amt with pt and if it is less than 100cc then he will have Kidney MD eval and transfuse pt if needed. Pt is to keep appt 2-12 with Dr Lindie Spruce.

## 2011-02-24 ENCOUNTER — Ambulatory Visit (INDEPENDENT_AMBULATORY_CARE_PROVIDER_SITE_OTHER): Payer: BC Managed Care – PPO | Admitting: General Surgery

## 2011-02-24 ENCOUNTER — Encounter: Payer: Self-pay | Admitting: Physician Assistant

## 2011-02-24 ENCOUNTER — Encounter (INDEPENDENT_AMBULATORY_CARE_PROVIDER_SITE_OTHER): Payer: Self-pay | Admitting: General Surgery

## 2011-02-24 VITALS — BP 172/90 | HR 80 | Temp 99.1°F | Resp 20 | Ht 72.0 in | Wt 219.2 lb

## 2011-02-24 DIAGNOSIS — R109 Unspecified abdominal pain: Secondary | ICD-10-CM

## 2011-02-24 DIAGNOSIS — R1032 Left lower quadrant pain: Secondary | ICD-10-CM | POA: Insufficient documentation

## 2011-02-24 MED ORDER — HYDROCODONE-ACETAMINOPHEN 5-325 MG PO TABS: 1.0000 | ORAL_TABLET | Freq: Every evening | ORAL | Status: DC | PRN

## 2011-02-24 MED ORDER — CIPROFLOXACIN HCL 500 MG PO TABS: 500.0000 mg | ORAL_TABLET | Freq: Two times a day (BID) | ORAL | Status: DC

## 2011-02-24 MED ORDER — METRONIDAZOLE 500 MG PO TABS: 500.0000 mg | ORAL_TABLET | Freq: Three times a day (TID) | ORAL | Status: DC

## 2011-02-24 NOTE — Progress Notes (Signed)
HPI The patient comes in complaining of left-sided abdominal pain. This has lasted for some time. He also has immediate postprandial diarrhea. This is now redirected significant for Clostridium difficile.  The patient also complains that his right arm fistula not having a flare that he would expect for dialysis. This will be addressed by the vascular surgery department tomorrow PE On examination on palpating his left lower abdominal area and also in the left midportion there appears to be a palpable mass in his abdomen. This could be the lateral aspect of his ventral hernia repair or an intra-abdominal process. However because it is very tender I think a CT scan of the abdomen with oral contrast only would be sufficient  Studiy review Currently there are no studies reviewed however I am ordering a CT scan of the abdomen and pelvis with oral contrast only  Assessment Possible left-sided colitis related to previous takedown colostomy. Immediate post prandial diarrhea possibly secondary to colitis.  Plan I will place the patient on ciprofloxacin and Flagyl with plans on terminating that if the CT scan does not demonstrate any evidence of inflammatory process. I will see him back in my office in 2 weeks.

## 2011-02-25 ENCOUNTER — Encounter (HOSPITAL_COMMUNITY): Payer: BC Managed Care – PPO

## 2011-02-25 ENCOUNTER — Ambulatory Visit: Payer: BC Managed Care – PPO

## 2011-02-25 ENCOUNTER — Other Ambulatory Visit (INDEPENDENT_AMBULATORY_CARE_PROVIDER_SITE_OTHER): Payer: Self-pay | Admitting: General Surgery

## 2011-02-25 ENCOUNTER — Ambulatory Visit
Admission: RE | Admit: 2011-02-25 | Discharge: 2011-02-25 | Disposition: A | Discharge status: Home / Self Care | Payer: BC Managed Care – PPO | Source: Ambulatory Visit | Attending: General Surgery | Admitting: General Surgery

## 2011-02-25 DIAGNOSIS — R109 Unspecified abdominal pain: Secondary | ICD-10-CM

## 2011-02-26 ENCOUNTER — Other Ambulatory Visit (INDEPENDENT_AMBULATORY_CARE_PROVIDER_SITE_OTHER): Payer: BC Managed Care – PPO | Admitting: *Deleted

## 2011-02-26 ENCOUNTER — Ambulatory Visit (INDEPENDENT_AMBULATORY_CARE_PROVIDER_SITE_OTHER): Payer: BC Managed Care – PPO | Admitting: Thoracic Diseases

## 2011-02-26 ENCOUNTER — Encounter: Payer: Self-pay | Admitting: Thoracic Diseases

## 2011-02-26 VITALS — BP 137/87 | HR 89 | Temp 98.2°F | Ht 72.0 in | Wt 220.0 lb

## 2011-02-26 DIAGNOSIS — T82898A Other specified complication of vascular prosthetic devices, implants and grafts, initial encounter: Secondary | ICD-10-CM

## 2011-02-26 DIAGNOSIS — N186 End stage renal disease: Secondary | ICD-10-CM
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
## 2011-02-27 ENCOUNTER — Other Ambulatory Visit: Payer: Self-pay | Admitting: *Deleted

## 2011-03-02 ENCOUNTER — Encounter (HOSPITAL_COMMUNITY): Payer: Self-pay | Admitting: Pharmacy Technician

## 2011-03-06 NOTE — Procedures (Unsigned)
VASCULAR LAB EXAM  INDICATION:  Followup right brachiocephalic fistula placed October 2012.  HISTORY: Diabetes: Cardiac: Hypertension:  EXAM:  The right brachiocephalic fistula is patent, with an area of anatomic narrowing at the anastomosis.  Velocities are 649 cm/sec. There also appears to be a small valve remnant at the area just distal to the arterial anastomosis.  There are 2 large branches in the mid upper arm that are likely competing with the fistula.  Velocities proximal to the branches are approximately 313 cm/sec.  Velocities distal to the branches are 107 cm/sec.  Please see diagram for details.  IMPRESSION:  Patent right brachiocephalic fistula with elevated velocities at the proximal anastomosis and 2 large competing branches in the mid upper arm, as described above.  ___________________________________________ Janetta Hora. Fields, MD  LT/MEDQ  D:  02/26/2011  T:  02/26/2011  Job:  [id number]

## 2011-03-09 ENCOUNTER — Other Ambulatory Visit (HOSPITAL_COMMUNITY): Payer: Self-pay | Admitting: *Deleted

## 2011-03-09 ENCOUNTER — Encounter (HOSPITAL_COMMUNITY): Payer: Self-pay | Admitting: *Deleted

## 2011-03-09 MED ORDER — SODIUM CHLORIDE 0.9 % IV SOLN: INTRAVENOUS | Status: DC

## 2011-03-09 MED ORDER — DEXTROSE 5 % IV SOLN
1.5000 g | INTRAVENOUS | Status: AC
  Administered 2011-03-10: 1.5 g via INTRAVENOUS
  Filled 2011-03-09 (×2): qty 1.5

## 2011-03-10 ENCOUNTER — Ambulatory Visit (HOSPITAL_COMMUNITY): Payer: BC Managed Care – PPO

## 2011-03-10 ENCOUNTER — Other Ambulatory Visit (INDEPENDENT_AMBULATORY_CARE_PROVIDER_SITE_OTHER): Payer: Self-pay

## 2011-03-10 ENCOUNTER — Telehealth (INDEPENDENT_AMBULATORY_CARE_PROVIDER_SITE_OTHER): Payer: Self-pay | Admitting: General Surgery

## 2011-03-10 ENCOUNTER — Ambulatory Visit (HOSPITAL_COMMUNITY)
Admission: RE | Admit: 2011-03-10 | Discharge: 2011-03-10 | Disposition: A | Discharge status: Home / Self Care | Payer: BC Managed Care – PPO | Source: Ambulatory Visit | Attending: Vascular Surgery | Admitting: Vascular Surgery

## 2011-03-10 ENCOUNTER — Encounter (HOSPITAL_COMMUNITY)
Admission: RE | Disposition: A | Discharge status: Home / Self Care | Payer: Self-pay | Source: Ambulatory Visit | Attending: Vascular Surgery

## 2011-03-10 ENCOUNTER — Encounter (HOSPITAL_COMMUNITY): Payer: Self-pay

## 2011-03-10 ENCOUNTER — Encounter (HOSPITAL_COMMUNITY): Payer: Self-pay | Admitting: *Deleted

## 2011-03-10 ENCOUNTER — Encounter (INDEPENDENT_AMBULATORY_CARE_PROVIDER_SITE_OTHER): Payer: BC Managed Care – PPO | Admitting: General Surgery

## 2011-03-10 DIAGNOSIS — T82898A Other specified complication of vascular prosthetic devices, implants and grafts, initial encounter: Secondary | ICD-10-CM

## 2011-03-10 DIAGNOSIS — Z992 Dependence on renal dialysis: Secondary | ICD-10-CM | POA: Insufficient documentation

## 2011-03-10 DIAGNOSIS — Y849 Medical procedure, unspecified as the cause of abnormal reaction of the patient, or of later complication, without mention of misadventure at the time of the procedure: Secondary | ICD-10-CM | POA: Insufficient documentation

## 2011-03-10 DIAGNOSIS — N186 End stage renal disease: Secondary | ICD-10-CM

## 2011-03-10 DIAGNOSIS — R109 Unspecified abdominal pain: Secondary | ICD-10-CM

## 2011-03-10 HISTORY — PX: ARTERIOVENOUS GRAFT PLACEMENT: SUR1029

## 2011-03-10 LAB — POCT I-STAT 4, (NA,K, GLUC, HGB,HCT)
Glucose, Bld: 90 mg/dL (ref 70–99)
HCT: 39 % (ref 39.0–52.0)
Hemoglobin: 13.3 g/dL (ref 13.0–17.0)

## 2011-03-10 LAB — APTT: aPTT: 38 seconds — ABNORMAL HIGH (ref 24–37)

## 2011-03-10 SURGERY — LIGATION OF COMPETING BRANCHES OF ARTERIOVENOUS FISTULA
Anesthesia: Monitor Anesthesia Care | Site: Arm Upper | Laterality: Right | Wound class: Clean

## 2011-03-10 MED ORDER — LIDOCAINE HCL (PF) 1 % IJ SOLN
INTRAMUSCULAR | Status: DC | PRN
  Administered 2011-03-10: 4 mL
  Administered 2011-03-10: 30 mL

## 2011-03-10 MED ORDER — MIDAZOLAM HCL 5 MG/5ML IJ SOLN
INTRAMUSCULAR | Status: DC | PRN
  Administered 2011-03-10: 1 mg via INTRAVENOUS

## 2011-03-10 MED ORDER — FENTANYL CITRATE 0.05 MG/ML IJ SOLN
INTRAMUSCULAR | Status: DC | PRN
  Administered 2011-03-10 (×2): 25 ug via INTRAVENOUS
  Administered 2011-03-10: 50 ug via INTRAVENOUS

## 2011-03-10 MED ORDER — SODIUM CHLORIDE 0.9 % IV SOLN
INTRAVENOUS | Status: DC | PRN
  Administered 2011-03-10 (×2): via INTRAVENOUS

## 2011-03-10 MED ORDER — OXYCODONE-ACETAMINOPHEN 7.5-325 MG PO TABS: 1.0000 | ORAL_TABLET | ORAL | Status: AC | PRN

## 2011-03-10 MED ORDER — 0.9 % SODIUM CHLORIDE (POUR BTL) OPTIME
TOPICAL | Status: DC | PRN
  Administered 2011-03-10: 1000 mL

## 2011-03-10 MED ORDER — PROMETHAZINE HCL 25 MG/ML IJ SOLN: 6.2500 mg | INTRAMUSCULAR | Status: DC | PRN

## 2011-03-10 MED ORDER — HYDROCODONE-ACETAMINOPHEN 5-325 MG PO TABS: 2.0000 | ORAL_TABLET | Freq: Every evening | ORAL | Status: DC | PRN

## 2011-03-10 MED ORDER — MEPERIDINE HCL 25 MG/ML IJ SOLN: 6.2500 mg | INTRAMUSCULAR | Status: DC | PRN

## 2011-03-10 MED ORDER — PROPOFOL 10 MG/ML IV EMUL
INTRAVENOUS | Status: DC | PRN
  Administered 2011-03-10: 75 ug/kg/min via INTRAVENOUS

## 2011-03-10 MED ORDER — MUPIROCIN 2 % EX OINT
TOPICAL_OINTMENT | CUTANEOUS | Status: AC
  Administered 2011-03-10: 1
  Filled 2011-03-10: qty 22

## 2011-03-10 MED ORDER — HYDROMORPHONE HCL PF 1 MG/ML IJ SOLN: 0.2500 mg | INTRAMUSCULAR | Status: DC | PRN

## 2011-03-10 SURGICAL SUPPLY — 41 items
ADH SKN CLS APL DERMABOND .7 (GAUZE/BANDAGES/DRESSINGS) ×1
ADH SKN CLS LQ APL DERMABOND (GAUZE/BANDAGES/DRESSINGS) ×1
CANISTER SUCTION 2500CC (MISCELLANEOUS) ×2 IMPLANT
CLIP TI MEDIUM 6 (CLIP) ×1 IMPLANT
CLIP TI WIDE RED SMALL 6 (CLIP) ×2 IMPLANT
CLOTH BEACON ORANGE TIMEOUT ST (SAFETY) ×2 IMPLANT
COVER SURGICAL LIGHT HANDLE (MISCELLANEOUS) ×4 IMPLANT
DERMABOND ADHESIVE PROPEN (GAUZE/BANDAGES/DRESSINGS) ×1
DERMABOND ADVANCED (GAUZE/BANDAGES/DRESSINGS) ×1
DERMABOND ADVANCED .7 DNX12 (GAUZE/BANDAGES/DRESSINGS) ×1 IMPLANT
DERMABOND ADVANCED .7 DNX6 (GAUZE/BANDAGES/DRESSINGS) IMPLANT
ELECT REM PT RETURN 9FT ADLT (ELECTROSURGICAL) ×2
ELECTRODE REM PT RTRN 9FT ADLT (ELECTROSURGICAL) ×1 IMPLANT
GEL ULTRASOUND 20GR AQUASONIC (MISCELLANEOUS) ×2 IMPLANT
GLOVE BIO SURGEON STRL SZ7 (GLOVE) ×1 IMPLANT
GLOVE BIO SURGEON STRL SZ7.5 (GLOVE) ×2 IMPLANT
GLOVE BIOGEL PI IND STRL 7.0 (GLOVE) IMPLANT
GLOVE BIOGEL PI IND STRL 7.5 (GLOVE) IMPLANT
GLOVE BIOGEL PI INDICATOR 7.0 (GLOVE) ×2
GLOVE BIOGEL PI INDICATOR 7.5 (GLOVE) ×2
GLOVE SURG SS PI 7.5 STRL IVOR (GLOVE) ×1 IMPLANT
GOWN PREVENTION PLUS XLARGE (GOWN DISPOSABLE) ×6 IMPLANT
GOWN STRL NON-REIN LRG LVL3 (GOWN DISPOSABLE) ×3 IMPLANT
KIT BASIN OR (CUSTOM PROCEDURE TRAY) ×2 IMPLANT
KIT ROOM TURNOVER OR (KITS) ×2 IMPLANT
LOOP VESSEL MINI RED (MISCELLANEOUS) IMPLANT
NS IRRIG 1000ML POUR BTL (IV SOLUTION) ×2 IMPLANT
PACK CV ACCESS (CUSTOM PROCEDURE TRAY) ×2 IMPLANT
PAD ARMBOARD 7.5X6 YLW CONV (MISCELLANEOUS) ×4 IMPLANT
SPONGE SURGIFOAM ABS GEL 100 (HEMOSTASIS) IMPLANT
SUT PROLENE 6 0 CC (SUTURE) ×2 IMPLANT
SUT SILK 0 (SUTURE) IMPLANT
SUT SILK 3 0 (SUTURE) ×2
SUT SILK 3-0 18XBRD TIE 12 (SUTURE) IMPLANT
SUT VIC AB 3-0 SH 27 (SUTURE) ×2
SUT VIC AB 3-0 SH 27X BRD (SUTURE) ×1 IMPLANT
SUT VICRYL 4-0 PS2 18IN ABS (SUTURE) ×2 IMPLANT
TOWEL OR 17X24 6PK STRL BLUE (TOWEL DISPOSABLE) ×2 IMPLANT
TOWEL OR 17X26 10 PK STRL BLUE (TOWEL DISPOSABLE) ×2 IMPLANT
UNDERPAD 30X30 INCONTINENT (UNDERPADS AND DIAPERS) ×2 IMPLANT
WATER STERILE IRR 1000ML POUR (IV SOLUTION) ×2 IMPLANT

## 2011-03-10 NOTE — Progress Notes (Signed)
Flushed right dialysis catheter flushed with 10cc NS and 2.2cc heparin (1,000 Units/cc).  Capped and Clamped. Drsg changed Site U

## 2011-03-10 NOTE — Discharge Instructions (Signed)
No diagram  03/10/2011 Rana Adorno [id number] 20-Apr-1950  Surgeon(s): Sherren Kerns, MD  Procedure(s): LIGATION OF COMPETING BRANCHES OF ARTERIOVENOUS FISTULA  Comments: none         Do not stick graft for 4 weeks

## 2011-03-10 NOTE — Op Note (Signed)
Procedure: Right Brachial Cephalic AV Fistula side branch ligation and superficialization PreOp: Poorly functioning AVF PostOp: same Anesthesia: Local with MAC  Findings: 6 large side branches ligated, diameter of main fistula 6-8 mm  Operative details: After obtaining informed consent, the patient was taken to the operating room. The patient was placed in supine position on the operating table. After adequate sedation, the patient's entire right upper extremity was prepped and draped in the correct in the usual sterile fashion. Jltrasound was used to idenitify all side branches of the fistula and these were marked.  Local anesthesia was infiltrated over each area of the fistula with side branches.  A longitudinal incision was made over each side branch. The incision was carried onto the subcutaneous tissues down to level of the pre-existing AV fistula. The fistula was patent and did have a thrill within it. The fistula was dissected free circumferentially in each incision and all side branches identified and ligated and divided between silk ties.  The main fistula was 6-8 mm diameter.  After all side branches were ligated the subcutaneous tissues under each skin bridge were debrided of subcutaneous fat to superficialize the fistula.    The skin of each incision was closed with a 4  0 Vicryl subcuticular stitch. The patient tolerated the procedure well and there were no complications. Instrument sponge and needle counts were correct at the end of the case. The patient was taken to the recovery room in stable condition.  Fabienne Bruns, MD [facility] [place] Office: [phone] Pager: [phone]

## 2011-03-10 NOTE — Interval H&P Note (Signed)
History and Physical Interval Note:  03/10/2011 7:39 AM  John R Rease Sr.  has presented today for surgery, with the diagnosis of ESRD;competing branch  The various methods of treatment have been discussed with the patient and family. After consideration of risks, benefits and other options for treatment, the patient has consented to  Procedure(s) (LRB): LIGATION OF COMPETING BRANCHES OF ARTERIOVENOUS FISTULA (Right) as a surgical intervention .  The patients' history has been reviewed, patient examined, no change in status, stable for surgery.  I have reviewed the patients' chart and labs.  Questions were answered to the patient's satisfaction.     Hugh Garrow E

## 2011-03-10 NOTE — Anesthesia Preprocedure Evaluation (Addendum)
Anesthesia Evaluation  Patient identified by MRN, date of birth, ID band Patient awake    Reviewed: Allergy & Precautions, H&P , NPO status , Patient's Chart, lab work & pertinent test results, reviewed documented beta blocker date and time   Airway Mallampati: I TM Distance: >3 FB Neck ROM: full    Dental  (+) Dental Advidsory Given and Edentulous Upper   Pulmonary  clear to auscultation        Cardiovascular Regular Normal    Neuro/Psych    GI/Hepatic   Endo/Other  Morbid obesity  Renal/GU ESRF and DialysisRenal disease     Musculoskeletal   Abdominal (+) obese,   Peds  Hematology  (+) anemia ,   Anesthesia Other Findings   Reproductive/Obstetrics                          Anesthesia Physical Anesthesia Plan  ASA: III  Anesthesia Plan: MAC   Post-op Pain Management:    Induction: Intravenous  Airway Management Planned: Simple Face Mask and Natural Airway  Additional Equipment:   Intra-op Plan:   Post-operative Plan:   Informed Consent: I have reviewed the patients History and Physical, chart, labs and discussed the procedure including the risks, benefits and alternatives for the proposed anesthesia with the patient or authorized representative who has indicated his/her understanding and acceptance.   Dental Advisory Given  Plan Discussed with: CRNA, Surgeon and Anesthesiologist  Anesthesia Plan Comments:        Anesthesia Quick Evaluation

## 2011-03-10 NOTE — Transfer of Care (Signed)
Immediate Anesthesia Transfer of Care Note  Patient: [name]  Procedure(s) Performed: Procedure(s) (LRB): LIGATION OF COMPETING BRANCHES OF ARTERIOVENOUS FISTULA (Right)  Patient Location: PACU  Anesthesia Type: MAC  Level of Consciousness: awake, alert  and oriented  Airway & Oxygen Therapy: Patient Spontanous Breathing and Patient connected to nasal cannula oxygen  Post-op Assessment: Report given to PACU RN, Post -op Vital signs reviewed and stable and Patient moving all extremities  Post vital signs: Reviewed and stable  Complications: No apparent anesthesia complications

## 2011-03-10 NOTE — Anesthesia Postprocedure Evaluation (Signed)
  Anesthesia Post-op Note  Patient: John R Schader Sr.  Procedure(s) Performed: Procedure(s) (LRB): LIGATION OF COMPETING BRANCHES OF ARTERIOVENOUS FISTULA (Right)  Patient Location: PACU  Anesthesia Type: MAC  Level of Consciousness: awake  Airway and Oxygen Therapy: Patient Spontanous Breathing  Post-op Pain: none  Post-op Assessment: Post-op Vital signs reviewed  Post-op Vital Signs: stable  Complications: No apparent anesthesia complications
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
## 2011-03-10 NOTE — Preoperative (Signed)
Beta Blockers   Reason not to administer Beta Blockers:Not Applicable 

## 2011-03-24 ENCOUNTER — Ambulatory Visit (INDEPENDENT_AMBULATORY_CARE_PROVIDER_SITE_OTHER): Payer: BC Managed Care – PPO | Admitting: General Surgery

## 2011-03-24 ENCOUNTER — Encounter (INDEPENDENT_AMBULATORY_CARE_PROVIDER_SITE_OTHER): Payer: Self-pay | Admitting: General Surgery

## 2011-03-24 VITALS — BP 150/90 | HR 72 | Temp 98.1°F | Resp 18 | Ht 72.0 in | Wt 213.6 lb

## 2011-03-24 DIAGNOSIS — K7689 Other specified diseases of liver: Secondary | ICD-10-CM

## 2011-03-24 NOTE — Progress Notes (Signed)
HPI The patient was doing extremely well although he continues to have some mild left-sided abdominal pain his color looks good he is more energetic.  PE There is no evidence of any wound problems or abdominal problems.  Studiy review None. However I will schedule a CT scan without contrast.  Assessment Doing well but he did have a hepatic hematoma which like to followup on today.  Plan Her discharge from clinic diameter the CT scan without contrast to followup on a sub-capsule hematoma of the liver.

## 2011-03-26 ENCOUNTER — Ambulatory Visit (HOSPITAL_COMMUNITY)
Admission: RE | Admit: 2011-03-26 | Discharge: 2011-03-26 | Disposition: A | Discharge status: Home / Self Care | Payer: BC Managed Care – PPO | Source: Ambulatory Visit | Attending: General Surgery | Admitting: General Surgery

## 2011-03-26 DIAGNOSIS — X58XXXA Exposure to other specified factors, initial encounter: Secondary | ICD-10-CM | POA: Insufficient documentation

## 2011-03-26 DIAGNOSIS — J90 Pleural effusion, not elsewhere classified: Secondary | ICD-10-CM | POA: Insufficient documentation

## 2011-03-26 DIAGNOSIS — R109 Unspecified abdominal pain: Secondary | ICD-10-CM | POA: Insufficient documentation

## 2011-03-26 DIAGNOSIS — S36112A Contusion of liver, initial encounter: Secondary | ICD-10-CM | POA: Insufficient documentation

## 2011-03-26 DIAGNOSIS — K7689 Other specified diseases of liver: Secondary | ICD-10-CM

## 2011-04-01 ENCOUNTER — Encounter: Payer: Self-pay | Admitting: Vascular Surgery

## 2011-04-02 ENCOUNTER — Encounter: Payer: Self-pay | Admitting: Vascular Surgery

## 2011-04-02 ENCOUNTER — Ambulatory Visit (INDEPENDENT_AMBULATORY_CARE_PROVIDER_SITE_OTHER): Payer: BC Managed Care – PPO | Admitting: Vascular Surgery

## 2011-04-02 VITALS — BP 168/88 | HR 63 | Temp 97.9°F | Ht 72.0 in | Wt 214.0 lb

## 2011-04-02 DIAGNOSIS — T82898A Other specified complication of vascular prosthetic devices, implants and grafts, initial encounter: Secondary | ICD-10-CM

## 2011-04-02 DIAGNOSIS — N186 End stage renal disease: Secondary | ICD-10-CM

## 2011-04-02 NOTE — Progress Notes (Signed)
Patient is status post Superficialization of a right brachiocephalic AV fistula. I also ligated multiple side branches. This was done 03/10/2011. The original fistula was placed in October 2012. He is currently dialyzing via a right-sided catheter. He has had no real problems with this. He denies any numbness or tingling in the right hand. He does have some mild upper extremity edema on the right side compared to the left.  On physical exam there is an easily palpable thrill in the fistula. The fistula is much more prominent and easily palpable throughout its entire course. All incisions are well-healed. There is an audible bruit. There is no evidence of steal. Right upper extremity is about 20% larger than left  Assessment: Healing right upper arm AV fistula which should be ready for cannulation next week.  Mild venous hypertension right arm hopefully will improve when catheter is out  Plan: Followup on as-needed basis.Ok for cannulation next week  Fabienne Bruns, MD [facility] [place] Office: [phone] Pager: [phone]

## 2011-04-24 ENCOUNTER — Telehealth (INDEPENDENT_AMBULATORY_CARE_PROVIDER_SITE_OTHER): Payer: Self-pay | Admitting: General Surgery

## 2011-04-24 NOTE — Telephone Encounter (Signed)
Pt calling to report Rt sided pain, esp with sneezing or coughing.  He is also experiencing low-grade fever and chills.  He had dialysis today; they called his nephrologist who has begun antibiotics, but is unaware of the pain in his Rt side.  Pt advised to contact his nephrologist with this information.  He understands and will comply.

## 2011-04-30 ENCOUNTER — Other Ambulatory Visit (HOSPITAL_COMMUNITY): Payer: Self-pay | Admitting: Nephrology

## 2011-04-30 ENCOUNTER — Encounter (HOSPITAL_COMMUNITY): Payer: BC Managed Care – PPO

## 2011-04-30 ENCOUNTER — Ambulatory Visit (HOSPITAL_COMMUNITY)
Admission: RE | Admit: 2011-04-30 | Discharge: 2011-04-30 | Disposition: A | Discharge status: Home / Self Care | Payer: BC Managed Care – PPO | Source: Ambulatory Visit | Attending: Nephrology | Admitting: Nephrology

## 2011-04-30 ENCOUNTER — Other Ambulatory Visit: Payer: Self-pay | Admitting: *Deleted

## 2011-04-30 DIAGNOSIS — N186 End stage renal disease: Secondary | ICD-10-CM

## 2011-04-30 DIAGNOSIS — Z452 Encounter for adjustment and management of vascular access device: Secondary | ICD-10-CM | POA: Insufficient documentation

## 2011-04-30 DIAGNOSIS — Z992 Dependence on renal dialysis: Secondary | ICD-10-CM | POA: Insufficient documentation

## 2011-04-30 MED ORDER — CHLORHEXIDINE GLUCONATE 4 % EX LIQD
CUTANEOUS | Status: AC
  Filled 2011-04-30: qty 30

## 2011-04-30 NOTE — Procedures (Signed)
Successful removal of dialysis catheter.  No immediate complications. 

## 2011-05-19 ENCOUNTER — Encounter: Payer: Self-pay | Admitting: Vascular Surgery

## 2011-05-20 ENCOUNTER — Encounter: Payer: Self-pay | Admitting: Vascular Surgery

## 2011-05-20 ENCOUNTER — Ambulatory Visit (INDEPENDENT_AMBULATORY_CARE_PROVIDER_SITE_OTHER): Payer: BC Managed Care – PPO | Admitting: Vascular Surgery

## 2011-05-20 VITALS — BP 113/54 | HR 66 | Temp 98.2°F | Ht 72.0 in | Wt 211.0 lb

## 2011-05-20 DIAGNOSIS — N186 End stage renal disease: Secondary | ICD-10-CM

## 2011-05-20 DIAGNOSIS — T82898A Other specified complication of vascular prosthetic devices, implants and grafts, initial encounter: Secondary | ICD-10-CM

## 2011-05-20 NOTE — Progress Notes (Signed)
[facility]  Patient name: John R Lubke Sr. MRN: [id number] DOB: 19-Sep-1950 Sex: male  REASON FOR VISIT: problems with right upper arm AV fistula  HPI: John R Swaim Sr. is a 61 y.o. male who had a right brachiocephalic AV fistula placed on 08/65/7846.  He also underwent superficialization of his AVF and ligation of competing branches on 03/10/2011. His catheter has been removed in may have been using his fistula for dialysis. It sounds like he had an infiltrate in one of his recent dialysis sessions and has had some pain and swelling in the fistula since that time. He was sent over by the dialysis center. He states that the fistula had been functioning adequately. He dialyzes on Mondays and Fridays. He has had no recent uremic symptoms. Specifically he denies nausea, vomiting, fatigue, anorexia.   REVIEW OF SYSTEMS: Arly.Keller ] denotes positive finding; [  ] denotes negative finding  CARDIOVASCULAR:  [ ]  chest pain   [ ]  dyspnea on exertion    CONSTITUTIONAL:  [ ]  fever   [ ]  chills  PHYSICAL EXAM: Filed Vitals:   05/20/11 0908  BP: 113/54  Pulse: 66  Temp: 98.2 F (36.8 C)  TempSrc: Oral  Height: 6' (1.829 m)  Weight: 211 lb (95.709 kg)   Body mass index is 28.62 kg/(m^2). GENERAL: The patient is a well-nourished male, in no acute distress. The vital signs are documented above. CARDIOVASCULAR: There is a regular rate and rhythm  PULMONARY: There is good air exchange bilaterally without wheezing or rales. His right upper arm fistula has a good bruit and thrill. There is some induration in the mid upper arm with some ecchymosis suggesting that he had an infiltrate. He has a palpable right radial pulse.  MEDICAL ISSUES: This patient likely had an infiltrate of his right upper arm AV fistula. I have explained that we could potentially place a temporary catheter he began so that the fistula could rest our he feels strongly opposed to this. I think it is reasonable  to continue to use the fistula and try to stay away from the central portion where he had the infiltrate. If he continues to have problems and I would recommend we proceed with a fistulogram and also place a catheter he was agreeable in order to give the fistula more time to rest since his infiltrate. He will notify us if he has continued problems with his fistula.  Trini Christiansen S [facility] [place] Beeper: [phone]

## 2011-05-28 ENCOUNTER — Ambulatory Visit: Payer: BC Managed Care – PPO | Admitting: Vascular Surgery

## 2011-06-11 ENCOUNTER — Ambulatory Visit: Payer: BC Managed Care – PPO | Admitting: Vascular Surgery

## 2011-08-03 ENCOUNTER — Encounter: Payer: Self-pay | Admitting: Vascular Surgery

## 2011-08-04 ENCOUNTER — Ambulatory Visit (INDEPENDENT_AMBULATORY_CARE_PROVIDER_SITE_OTHER): Payer: BC Managed Care – PPO | Admitting: Vascular Surgery

## 2011-08-04 ENCOUNTER — Encounter (HOSPITAL_COMMUNITY): Payer: Self-pay | Admitting: Pharmacy Technician

## 2011-08-04 ENCOUNTER — Encounter: Payer: Self-pay | Admitting: Vascular Surgery

## 2011-08-04 VITALS — BP 108/66 | HR 67 | Resp 18 | Ht 72.0 in | Wt 218.6 lb

## 2011-08-04 DIAGNOSIS — N186 End stage renal disease: Secondary | ICD-10-CM
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
## 2011-08-12 ENCOUNTER — Encounter (HOSPITAL_COMMUNITY)
Admission: RE | Disposition: A | Discharge status: Home / Self Care | Payer: Self-pay | Source: Ambulatory Visit | Attending: Vascular Surgery

## 2011-08-12 ENCOUNTER — Ambulatory Visit (HOSPITAL_COMMUNITY)
Admission: RE | Admit: 2011-08-12 | Discharge: 2011-08-12 | Disposition: A | Discharge status: Home / Self Care | Payer: BC Managed Care – PPO | Source: Ambulatory Visit | Attending: Vascular Surgery | Admitting: Vascular Surgery

## 2011-08-12 DIAGNOSIS — I871 Compression of vein: Secondary | ICD-10-CM | POA: Insufficient documentation

## 2011-08-12 DIAGNOSIS — N186 End stage renal disease: Secondary | ICD-10-CM | POA: Insufficient documentation

## 2011-08-12 DIAGNOSIS — T82898A Other specified complication of vascular prosthetic devices, implants and grafts, initial encounter: Secondary | ICD-10-CM

## 2011-08-12 DIAGNOSIS — Y832 Surgical operation with anastomosis, bypass or graft as the cause of abnormal reaction of the patient, or of later complication, without mention of misadventure at the time of the procedure: Secondary | ICD-10-CM | POA: Insufficient documentation

## 2011-08-12 HISTORY — PX: FISTULOGRAM: SHX5832

## 2011-08-12 LAB — POCT I-STAT, CHEM 8
Chloride: 103 mEq/L (ref 96–112)
HCT: 38 % — ABNORMAL LOW (ref 39.0–52.0)
Hemoglobin: 12.9 g/dL — ABNORMAL LOW (ref 13.0–17.0)
Potassium: 4.4 mEq/L (ref 3.5–5.1)

## 2011-08-12 SURGERY — FISTULOGRAM
Anesthesia: LOCAL | Laterality: Right

## 2011-08-12 MED ORDER — SODIUM CHLORIDE 0.9 % IV SOLN: 250.0000 mL | INTRAVENOUS | Status: DC | PRN

## 2011-08-12 MED ORDER — HEPARIN (PORCINE) IN NACL 2-0.9 UNIT/ML-% IJ SOLN
INTRAMUSCULAR | Status: AC
  Filled 2011-08-12: qty 1000

## 2011-08-12 MED ORDER — SODIUM CHLORIDE 0.9 % IJ SOLN: 3.0000 mL | INTRAMUSCULAR | Status: DC | PRN

## 2011-08-12 MED ORDER — LIDOCAINE HCL (PF) 1 % IJ SOLN
INTRAMUSCULAR | Status: AC
  Filled 2011-08-12: qty 30

## 2011-08-12 MED ORDER — SODIUM CHLORIDE 0.9 % IJ SOLN
3.0000 mL | Freq: Two times a day (BID) | INTRAMUSCULAR | Status: DC
  Administered 2011-08-12: 3 mL via INTRAVENOUS

## 2011-08-12 NOTE — Interval H&P Note (Signed)
History and Physical Interval Note:  08/12/2011 12:49 PM  John R Lora Sr.  has presented today for surgery, with the diagnosis of right arm/right central vein stenosis  The various methods of treatment have been discussed with the patient and family. After consideration of risks, benefits and other options for treatment, the patient has consented to  Procedure(s) (LRB): FISTULOGRAM (Right) as a surgical intervention .  The patient's history has been reviewed, patient examined, no change in status, stable for surgery.  I have reviewed the patient's chart and labs.  Questions were answered to the patient's satisfaction.     Hicks Feick

## 2011-08-12 NOTE — Op Note (Signed)
OPERATIVE REPORT  DATE OF SURGERY: 08/12/2011  PATIENT: John Amber Sr., 61 y.o. 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    ANESTHESIA:  1% lidocaine  EBL: Minimal ml     BLOOD ADMINISTERED: None  DRAINS: None  SPECIMEN: None  COUNTS CORRECT:  YES  PLAN OF CARE: Holding area stable   PATIENT DISPOSITION:  PACU - hemodynamically stable  PROCEDURE DETAILS: The patient was taken to the peripheral vascular cath lab and placed in supine position where the area of the right arm was prepped and draped in usual sterile fashion using local anesthesia and a single wall puncture the upper arm fistula was accessed with a micropuncture technique. Hand injection revealed excellent caliber of the cephalic vein fistula with no evidence of stenosis through the connection with the subclavian vein. 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 A 4 French endhole catheter was exchanged for the micropuncture catheter and was positioned at the level of the occlusion. An angled Glidewire was used to cross through the occlusion into the right atrium. The catheter was advanced to this area and the Glidewire was exchanged for a Amplatz superstiff wire. Amplatz superstiff wire was passed into the level of the inferior vena cava. A 7 French sheath was passed over the guidewire and initially a 7 mm x 4 cm balloon was positioned the level of the stenosis and this was inflated this was then upsized to a 9 mm balloon. Repeat injection showed good antegrade flow through the area that had been occluded but still some wasting of the vein. For this reason  a 12 mm balloon was brought onto the field and was used for angioplasty at the level of the subclavian stenosis. Recent venogram showed good flow through the prior area of occlusion. A pursestring suture of 3-0 nylon was positioned around the 7 French sheath and this was removed and the suture was secured with excellent hemostasis. The patient was transferred to [facility] area in stable condition   Gretta Began, M.D. 08/12/2011 2:41 PM
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
## 2011-09-18 ENCOUNTER — Telehealth (INDEPENDENT_AMBULATORY_CARE_PROVIDER_SITE_OTHER): Payer: Self-pay

## 2011-09-18 NOTE — Telephone Encounter (Signed)
Message copied by Brennan Bailey on Fri Sep 18, 2011  9:19 AM ------      Message from: Zacarias Pontes      Created: Thu Sep 17, 2011  8:56 AM       Pt hjas questions about having an upcoming kidney transplant.he has had lots of different sx and wanting to ask questions about going back in through his stomach..[id number] or [phone]

## 2011-09-18 NOTE — Telephone Encounter (Signed)
I tried returning phone call to [place] to let him know that Dr. Lindie Spruce is out of the office this week and will be around at the hospital next week. I will have Dr. Lindie Spruce call him next week.

## 2011-10-09 ENCOUNTER — Other Ambulatory Visit: Payer: Self-pay

## 2011-10-16 ENCOUNTER — Ambulatory Visit (HOSPITAL_COMMUNITY)
Admission: RE | Admit: 2011-10-16 | Discharge: 2011-10-16 | Disposition: A | Discharge status: Home / Self Care | Payer: BC Managed Care – PPO | Source: Ambulatory Visit | Attending: Vascular Surgery | Admitting: Vascular Surgery

## 2011-10-16 ENCOUNTER — Encounter (HOSPITAL_COMMUNITY)
Admission: RE | Disposition: A | Discharge status: Home / Self Care | Payer: Self-pay | Source: Ambulatory Visit | Attending: Vascular Surgery

## 2011-10-16 DIAGNOSIS — I871 Compression of vein: Secondary | ICD-10-CM | POA: Insufficient documentation

## 2011-10-16 DIAGNOSIS — I87309 Chronic venous hypertension (idiopathic) without complications of unspecified lower extremity: Secondary | ICD-10-CM | POA: Insufficient documentation

## 2011-10-16 DIAGNOSIS — T82898A Other specified complication of vascular prosthetic devices, implants and grafts, initial encounter: Secondary | ICD-10-CM

## 2011-10-16 HISTORY — PX: SHUNTOGRAM: SHX5491

## 2011-10-16 LAB — POCT I-STAT, CHEM 8
BUN: 77 mg/dL — ABNORMAL HIGH (ref 6–23)
Chloride: 108 mEq/L (ref 96–112)
HCT: 31 % — ABNORMAL LOW (ref 39.0–52.0)
Potassium: 5 mEq/L (ref 3.5–5.1)

## 2011-10-16 SURGERY — ASSESSMENT, SHUNT FUNCTION, WITH CONTRAST RADIOGRAPHIC STUDY
Anesthesia: LOCAL

## 2011-10-16 MED ORDER — LIDOCAINE HCL (PF) 1 % IJ SOLN
INTRAMUSCULAR | Status: AC
  Filled 2011-10-16: qty 30

## 2011-10-16 MED ORDER — ACETAMINOPHEN 325 MG PO TABS: 325.0000 mg | ORAL_TABLET | ORAL | Status: DC | PRN

## 2011-10-16 MED ORDER — LABETALOL HCL 5 MG/ML IV SOLN: 10.0000 mg | INTRAVENOUS | Status: DC | PRN

## 2011-10-16 MED ORDER — PHENOL 1.4 % MT LIQD: 1.0000 | OROMUCOSAL | Status: DC | PRN

## 2011-10-16 MED ORDER — SODIUM CHLORIDE 0.9 % IJ SOLN: 3.0000 mL | INTRAMUSCULAR | Status: DC | PRN

## 2011-10-16 MED ORDER — HEPARIN SODIUM (PORCINE) 1000 UNIT/ML IJ SOLN
INTRAMUSCULAR | Status: AC
  Filled 2011-10-16: qty 1

## 2011-10-16 MED ORDER — ONDANSETRON HCL 4 MG/2ML IJ SOLN: 4.0000 mg | Freq: Four times a day (QID) | INTRAMUSCULAR | Status: DC | PRN

## 2011-10-16 MED ORDER — GUAIFENESIN-DM 100-10 MG/5ML PO SYRP: 15.0000 mL | ORAL_SOLUTION | ORAL | Status: DC | PRN

## 2011-10-16 MED ORDER — HYDRALAZINE HCL 20 MG/ML IJ SOLN: 10.0000 mg | INTRAMUSCULAR | Status: DC | PRN

## 2011-10-16 MED ORDER — ACETAMINOPHEN 325 MG RE SUPP: 325.0000 mg | RECTAL | Status: DC | PRN

## 2011-10-16 MED ORDER — METOPROLOL TARTRATE 1 MG/ML IV SOLN: 2.0000 mg | INTRAVENOUS | Status: DC | PRN
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
## 2011-10-16 NOTE — Care Management (Signed)
The Patient status was deleted in error, was trying to update level of care from med-surg to post op.

## 2011-10-16 NOTE — Op Note (Signed)
Procedure: Right brachial cephalic fistulogram, angioplasty of right innominate/subclavian vein  Preoperative diagnosis: Venous hypertension  Postoperative diagnosis: Same  Anesthesia: Local  Operative details: After obtaining informed consent, the patient was taken to the PV lab. The patient was placed in supine position on the Angio table. Entire right upper extremity was prepped and draped in usual sterile fashion.   Local anesthesia was infiltrated over the proximal portion of the fistula in the right arm.  A micropuncture needle was brought up on the operative field and this was used to directly cannulate the fistula using ultrasound guidance. A micropuncture wire was then threaded into the fistula and the micropuncture sheath threaded over this. Sheath was thoroughly flushed with heparinized saline and the dilator was removed. Contrast angiogram was then performed of the fistula.  The central venous structures are patent. There is a high-grade stenosis 90% at the innominate subclavian junction adjacent to the clavicle. Pressure was held on the upper arm in order to reflux contrast across the arterial anastomosis. The arterial anastomosis is patent without narrowing.  At this point, it was decided to intervene on the stenosis. The patient was given 3000 units of intravenous heparin.  The micropuncture sheath was exchanged over an 035 versacore wire for a 7 French short sheath. This was thoroughly flushed with heparinized saline. Using anatomical landmarks an 035 angled Bentsen wire was brought in operative field and advanced into the central venous system. A 8 x 40 mm Dorado angioplasty balloon was brought in operative field and advanced over the glidewire past the stenosis and then the wire exchanged via the balloon for a long 035 Rosen wire which was then advanced into the inferior vena cava.   The balloon was centered at the stenosis using angiographic and anatomic landmarks.  There was waisting  on the balloon at this point with inflation to 10 Atm. However, the vessel was of much larger diameter.  The 8 x 40 balloon was removed and exchanged for a 12 x 40 atlas angioplasty balloon.  This was advanced to the stenosis and inflated to 10 atmospheres for 60 seconds. Contrast angiogram was again repeated which showed the 90% stenosis had been reduced to less than 10%. I felt that angioplasty with a larger balloon would have put the fistula at risk of rupture. Therefore, the balloon was then pulled back over the guidewire and removed as well as the wire.    A 3-0 Monocryl pursestring stitch was placed around the sheath and the sheath removed. Hemostasis was obtained.  The patient tolerated the procedure well and there were no complications. The patient was taken to the holding area in stable condition.  Operative findings: 90% stenosis of subclavian innominate junction angioplastied to <10% stenosis           No central vein stenosis          Fistula is ready for use  Fabienne Bruns, MD [facility] [place] Office: [phone] Pager: [phone]

## 2011-11-10 ENCOUNTER — Ambulatory Visit (INDEPENDENT_AMBULATORY_CARE_PROVIDER_SITE_OTHER): Payer: BC Managed Care – PPO | Admitting: General Surgery

## 2011-11-13 ENCOUNTER — Telehealth (HOSPITAL_COMMUNITY): Payer: Self-pay | Admitting: Orthopedic Surgery

## 2011-11-13 NOTE — Telephone Encounter (Signed)
Home # would not go through. Left message on cell phone # in system.

## 2011-11-23 ENCOUNTER — Encounter (INDEPENDENT_AMBULATORY_CARE_PROVIDER_SITE_OTHER): Payer: Self-pay | Admitting: General Surgery

## 2011-12-02 ENCOUNTER — Telehealth (INDEPENDENT_AMBULATORY_CARE_PROVIDER_SITE_OTHER): Payer: Self-pay | Admitting: General Surgery

## 2011-12-02 ENCOUNTER — Telehealth (HOSPITAL_COMMUNITY): Payer: Self-pay | Admitting: General Surgery
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
## 2011-12-02 NOTE — Telephone Encounter (Signed)
Informed patient to notify us of his disposition at urgent care clinic.
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
## 2011-12-24 ENCOUNTER — Ambulatory Visit: Payer: BC Managed Care – PPO | Admitting: Urgent Care

## 2011-12-29 ENCOUNTER — Encounter (INDEPENDENT_AMBULATORY_CARE_PROVIDER_SITE_OTHER): Payer: Self-pay | Admitting: General Surgery

## 2011-12-29 ENCOUNTER — Ambulatory Visit (INDEPENDENT_AMBULATORY_CARE_PROVIDER_SITE_OTHER): Payer: BC Managed Care – PPO | Admitting: General Surgery

## 2011-12-29 VITALS — BP 132/74 | HR 74 | Temp 97.8°F | Resp 16 | Ht 72.0 in | Wt 235.0 lb

## 2011-12-29 DIAGNOSIS — K625 Hemorrhage of anus and rectum: Secondary | ICD-10-CM | POA: Insufficient documentation

## 2011-12-29 NOTE — Progress Notes (Signed)
The patient came in today because he had been having recent problems with left-sided abdominal pain and some rectal bleeding.  He describes rectal bleeding has been intermittent and associated with left-sided abdominal pain usually coming out was looser than usual stools and filling the toilet with blood. He hasn't had it recently however he does have an appointment to see a gastroenterologist tomorrow. By his report he is scheduled to see Dr. Jeani Hawking.  On examination today he has an eventration of his abdominal wall where he had the previous biologic prosthetic place. It is completely asymptomatic.  I asked the patient whether not this could be secondary to hemorrhoids and he says that he does not have any hemorrhoidal symptoms no pain and no swelling.  I suspect the patient has diverticular disease with some bleeding in the outside chance of having a polyp or tumor causing this problem. I feel confident that with his referral to the gastroenterologist these problems will be uncovered. He is to return to see me on an as-needed basis.

## 2011-12-30 ENCOUNTER — Encounter: Payer: Self-pay | Admitting: Medical

## 2011-12-30 ENCOUNTER — Ambulatory Visit (INDEPENDENT_AMBULATORY_CARE_PROVIDER_SITE_OTHER): Payer: BC Managed Care – PPO | Admitting: Medical

## 2011-12-30 VITALS — BP 108/70 | HR 60 | Temp 98.5°F | Resp 16 | Ht 71.5 in | Wt 234.0 lb

## 2011-12-30 DIAGNOSIS — Z992 Dependence on renal dialysis: Secondary | ICD-10-CM

## 2011-12-30 DIAGNOSIS — K029 Dental caries, unspecified: Secondary | ICD-10-CM

## 2011-12-30 DIAGNOSIS — Z0000 Encounter for general adult medical examination without abnormal findings: Secondary | ICD-10-CM

## 2011-12-30 DIAGNOSIS — Z125 Encounter for screening for malignant neoplasm of prostate: Secondary | ICD-10-CM

## 2011-12-30 DIAGNOSIS — R9431 Abnormal electrocardiogram [ECG] [EKG]: Secondary | ICD-10-CM

## 2011-12-30 DIAGNOSIS — K921 Melena: Secondary | ICD-10-CM

## 2011-12-30 LAB — LIPID PANEL
Cholesterol: 194 mg/dL (ref 0–200)
Total CHOL/HDL Ratio: 4.4 Ratio
VLDL: 20 mg/dL (ref 0–40)

## 2011-12-30 LAB — POCT URINALYSIS DIPSTICK
Bilirubin, UA: NEGATIVE
Glucose, UA: NEGATIVE
Leukocytes, UA: NEGATIVE
Nitrite, UA: NEGATIVE
Urobilinogen, UA: NEGATIVE

## 2011-12-30 LAB — HIV ANTIBODY (ROUTINE TESTING W REFLEX): HIV: NONREACTIVE
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
## 2012-01-08 ENCOUNTER — Telehealth: Payer: Self-pay | Admitting: Family Medicine

## 2012-01-08 NOTE — Telephone Encounter (Signed)
Patient is aware of his appointment at [facility] on 01/14/12 @ 805 am to see Dr. Dorethea Clan. CLS [phone]

## 2012-01-14 ENCOUNTER — Other Ambulatory Visit (HOSPITAL_COMMUNITY): Payer: Self-pay | Admitting: Cardiology

## 2012-01-14 DIAGNOSIS — R9431 Abnormal electrocardiogram [ECG] [EKG]: Secondary | ICD-10-CM

## 2012-01-14 DIAGNOSIS — R011 Cardiac murmur, unspecified: Secondary | ICD-10-CM

## 2012-01-20 ENCOUNTER — Ambulatory Visit (HOSPITAL_COMMUNITY)
Admission: RE | Admit: 2012-01-20 | Discharge: 2012-01-20 | Disposition: A | Discharge status: Home / Self Care | Payer: BC Managed Care – PPO | Source: Ambulatory Visit | Attending: Cardiology | Admitting: Cardiology

## 2012-01-20 DIAGNOSIS — R9431 Abnormal electrocardiogram [ECG] [EKG]: Secondary | ICD-10-CM

## 2012-01-20 DIAGNOSIS — I369 Nonrheumatic tricuspid valve disorder, unspecified: Secondary | ICD-10-CM | POA: Insufficient documentation

## 2012-01-20 DIAGNOSIS — R011 Cardiac murmur, unspecified: Secondary | ICD-10-CM | POA: Insufficient documentation

## 2012-01-20 DIAGNOSIS — I059 Rheumatic mitral valve disease, unspecified: Secondary | ICD-10-CM | POA: Insufficient documentation

## 2012-01-20 NOTE — Progress Notes (Signed)
2D Echo Performed 01/20/2012    Lejon Afzal, RCS  

## 2012-02-02 ENCOUNTER — Other Ambulatory Visit: Payer: Self-pay | Admitting: *Deleted

## 2012-02-04 ENCOUNTER — Encounter (HOSPITAL_COMMUNITY): Payer: Self-pay | Admitting: Pharmacy Technician

## 2012-02-09 ENCOUNTER — Encounter (HOSPITAL_COMMUNITY)
Admission: RE | Disposition: A | Discharge status: Home / Self Care | Payer: Self-pay | Source: Ambulatory Visit | Attending: Surgery

## 2012-02-09 ENCOUNTER — Ambulatory Visit (HOSPITAL_COMMUNITY)
Admission: RE | Admit: 2012-02-09 | Discharge: 2012-02-09 | Disposition: A | Discharge status: Home / Self Care | Payer: BC Managed Care – PPO | Source: Ambulatory Visit | Attending: Surgery | Admitting: Surgery

## 2012-02-09 DIAGNOSIS — N186 End stage renal disease: Secondary | ICD-10-CM | POA: Insufficient documentation

## 2012-02-09 DIAGNOSIS — I871 Compression of vein: Secondary | ICD-10-CM | POA: Insufficient documentation

## 2012-02-09 DIAGNOSIS — T82898A Other specified complication of vascular prosthetic devices, implants and grafts, initial encounter: Secondary | ICD-10-CM | POA: Insufficient documentation

## 2012-02-09 DIAGNOSIS — Y832 Surgical operation with anastomosis, bypass or graft as the cause of abnormal reaction of the patient, or of later complication, without mention of misadventure at the time of the procedure: Secondary | ICD-10-CM | POA: Insufficient documentation

## 2012-02-09 HISTORY — PX: SHUNTOGRAM: SHX5491

## 2012-02-09 LAB — POCT I-STAT, CHEM 8
Calcium, Ion: 1.15 mmol/L (ref 1.13–1.30)
Creatinine, Ser: 4.7 mg/dL — ABNORMAL HIGH (ref 0.50–1.35)
Glucose, Bld: 90 mg/dL (ref 70–99)
HCT: 37 % — ABNORMAL LOW (ref 39.0–52.0)
Hemoglobin: 12.6 g/dL — ABNORMAL LOW (ref 13.0–17.0)

## 2012-02-09 SURGERY — ASSESSMENT, SHUNT FUNCTION, WITH CONTRAST RADIOGRAPHIC STUDY
Anesthesia: LOCAL

## 2012-02-09 MED ORDER — LIDOCAINE HCL (PF) 1 % IJ SOLN
INTRAMUSCULAR | Status: AC
  Filled 2012-02-09: qty 30

## 2012-02-09 MED ORDER — SODIUM CHLORIDE 0.9 % IJ SOLN: 3.0000 mL | INTRAMUSCULAR | Status: DC | PRN

## 2012-02-09 MED ORDER — HEPARIN SODIUM (PORCINE) 1000 UNIT/ML IJ SOLN
INTRAMUSCULAR | Status: AC
  Filled 2012-02-09: qty 1
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
## 2012-02-09 NOTE — Op Note (Signed)
[facility]  Patient name: [name] MRN: [id number] DOB: Mar 02, 1950 Sex: male  02/09/2012 Pre-operative Diagnosis: End-stage renal disease with swollen right arm Post-operative diagnosis:  Same Surgeon:  Jorge Ny Procedure Performed:  1.  ultrasound access right arm fistula  2.  fistulogram  3.  venoplasty, right subclavian and right innominate vein  4.  followup x2   Indications:  The patient has a right upper arm fistula which has been intervened on in the past for central vein stenosis/occlusion. His most recent intervention was then October. He has developed recurrent swelling of his right arm.  Procedure:  The patient was identified in the holding area and taken to room 8.  The patient was then placed supine on the table and prepped and draped in the usual sterile fashion.  A time out was called.  Ultrasound was used to evaluate the fistula.  The vein was patent and compressible.  A digital ultrasound image was acquired.  The fistula was then accessed under ultrasound guidance using a micropuncture needle.  An 018 wire was then asvanced without resistance and a micropuncture sheath was placed.  Contrast injections were then performed through the sheath.  Findings:  The fistula is widely patent in the upper arm. There is again noted central vein occlusion beginning in the mid the subclavian vein. There are numerous collaterals in this area.   Intervention:  After the above findings were obtained, the decision was made to proceed with intervention. An 8 French sheath was placed. The patient was heparinized. I used a Kumpe catheter and a Glidewire to navigate a cross the stenosis. I was able to advance the Kumpe catheter into the right atrium. A contrast injection at this point was performed, confirming that I was intraluminal. I then proceeded with venoplasty of the occluded vein. This was initially done with an 8 mm Dorado balloon. It was  taken to grade pressure and held for 1 minute. Following venoplasty, there was a patent flow channel through the vein, however this was very small. I therefore upsized to a 10 mm Dorado balloon which was taken to burst pressure, again held for 1 minute. After this inflation, thrombus was seen within the vein which was likely chronic from the total occlusion. I upsized again to a 12 x 4 Atlas balloon. This was initially taken to 10 atmospheres. A followup arteriogram showed significant improvement but still residual thrombus as well as narrowing within the innominate vein. I therefore reinserted the 12 mm balloon this time, I took the balloon to grade pressure and held it up for 1 minute. Completion studies revealed significant improvement. The stenosis was nearly resolved. However, there was filling of the collaterals. The next option would be to place a stent. At this point I did not wish to do that. There was a significant improvement and therefore I elected to stop the procedure at this point. Catheters and wires were removed. A suture was used to close the sheath access site.  Impression:  #1  successful recanalization of an occluded right subclavian and innominate vein, ultimately using a 12 mm balloon  #2  should the patient have an early recurrence, I would consider stenting this area   V. Durene Cal, M.D. [facility] [place] Office: [phone] Pager:  [phone]
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
## 2012-03-28 IMAGING — US US RENAL PORT
1 series · 14 of 25 positions shown · non-contrast
Comparison: CT from the previous day

CLINICAL DATA: Acute renal failure

RENAL/URINARY TRACT ULTRASOUND COMPLETE

[Series 1: us renal port · 0.44mm/px · 14 of 31 slices shown]
[im 1/31]
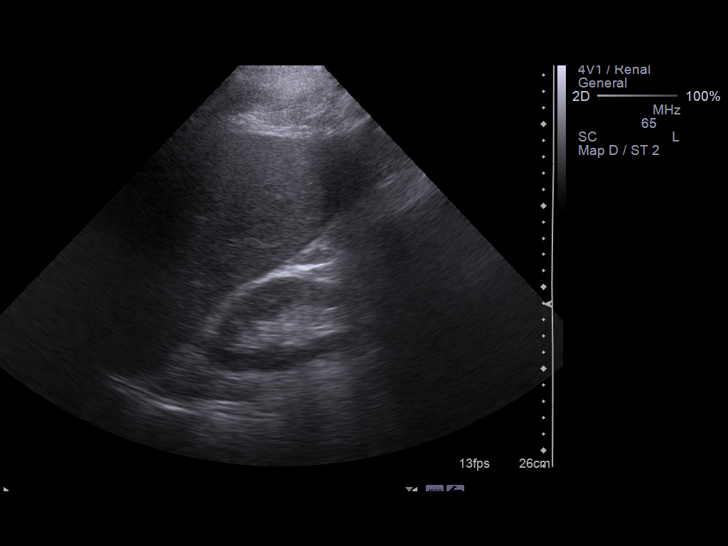
[im 3/31]
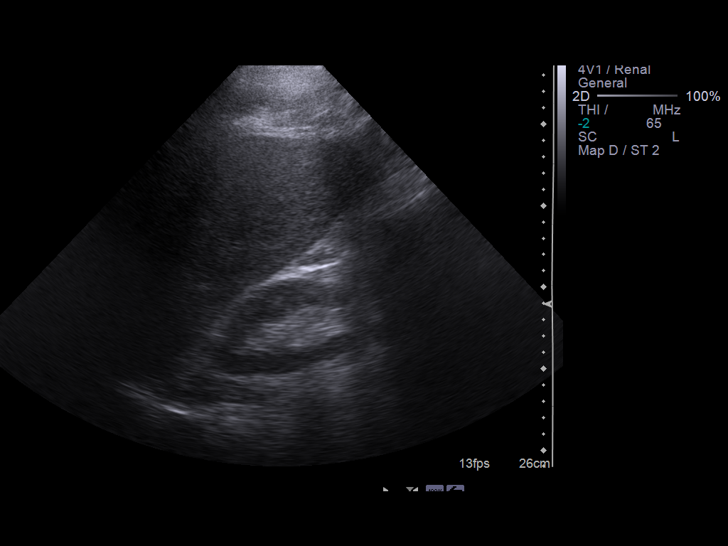
[im 6/31]
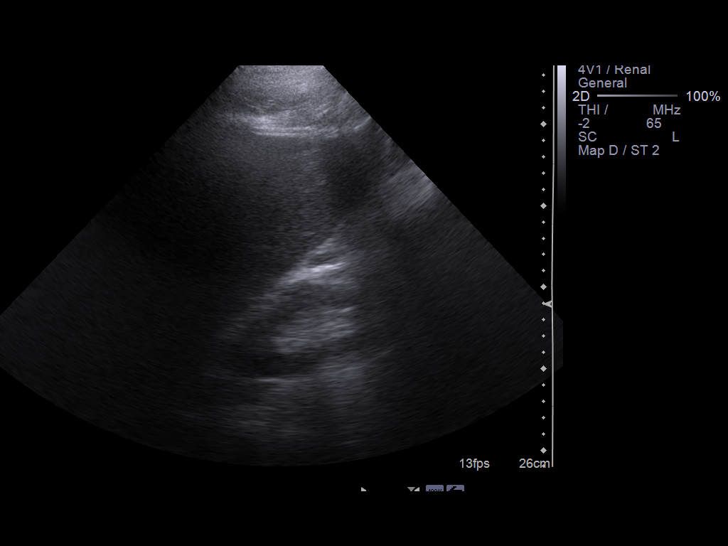
[im 8/31]
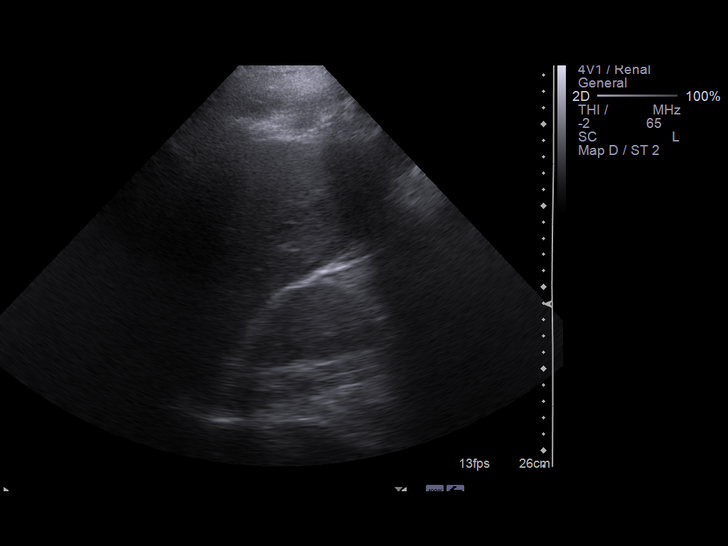
[im 11/31]
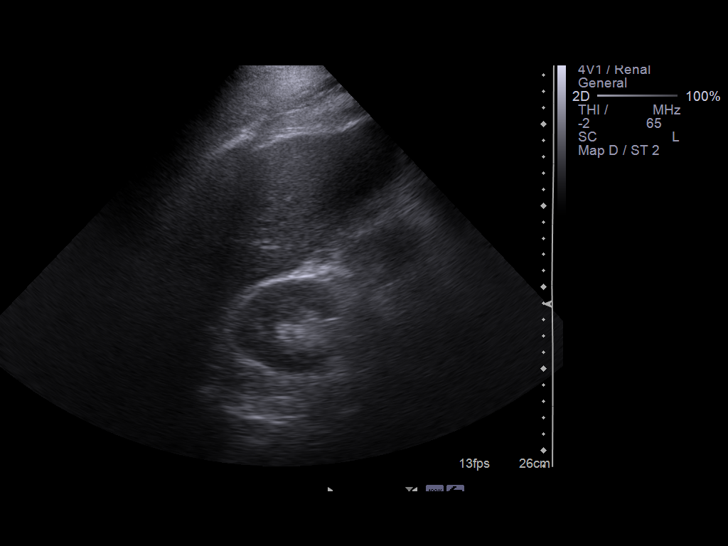
[im 12/31]
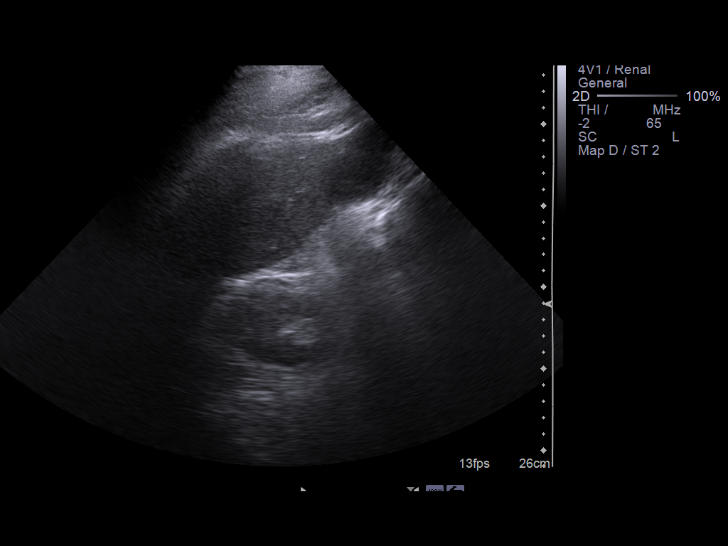
[im 14/31]
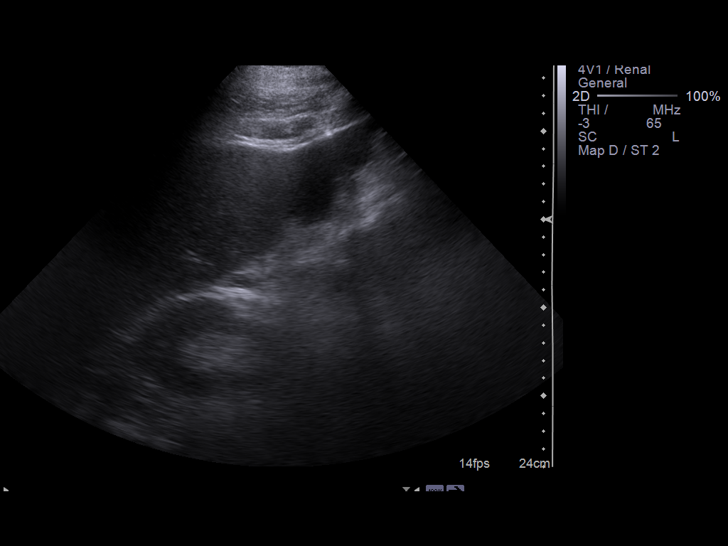
[im 17/31]
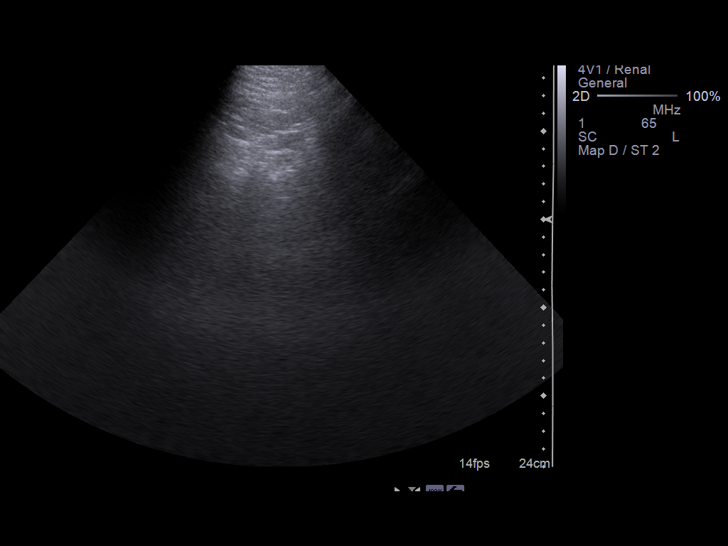
[im 19/31]
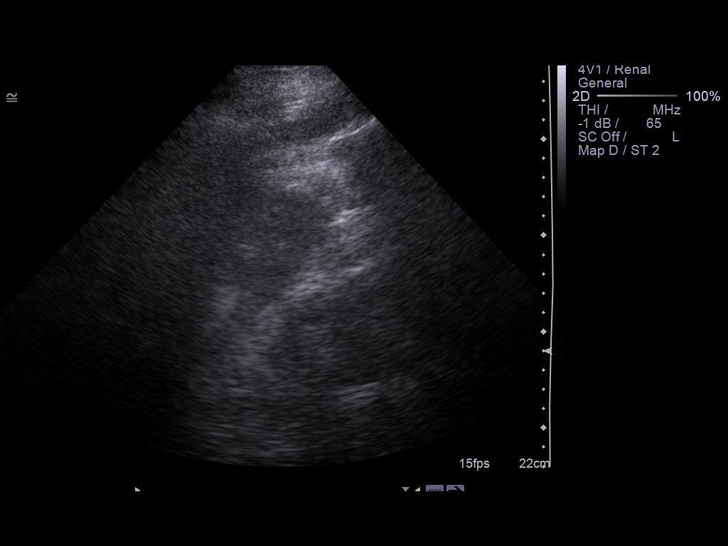
[im 21/31]
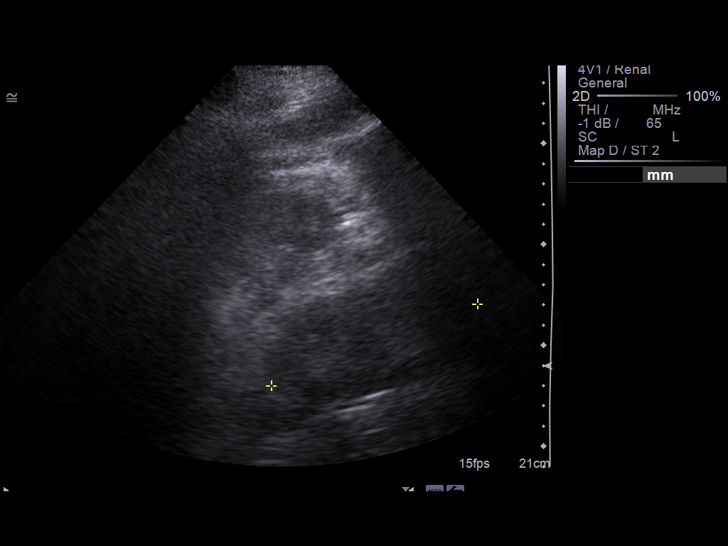
[im 23/31]
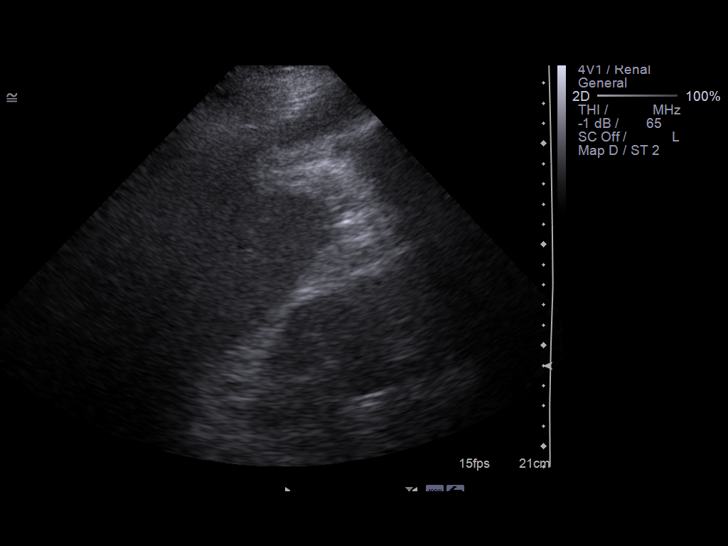
[im 26/31]
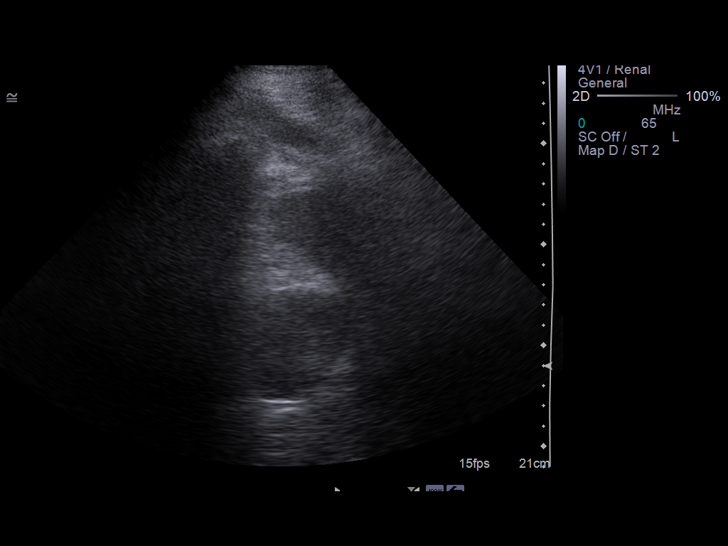
[im 28/31]
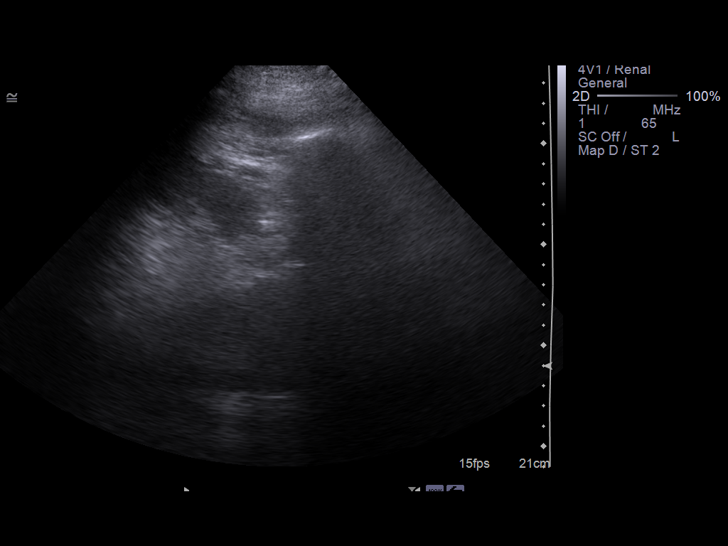
[im 31/31]
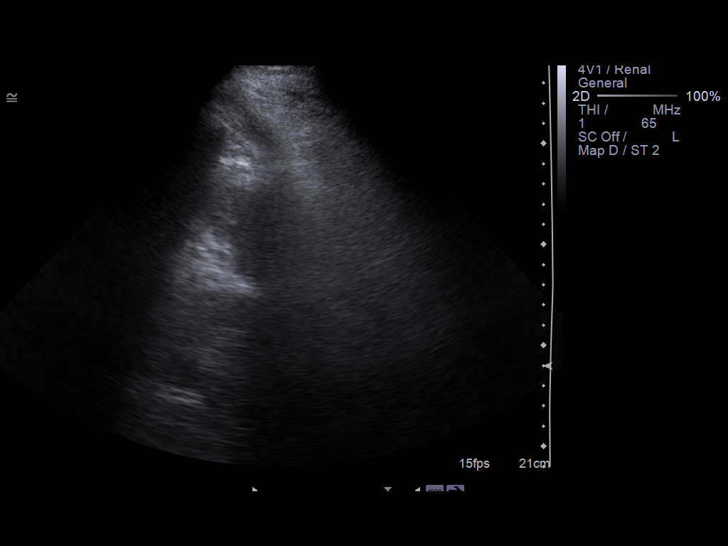

[14 of 25 positions shown; findings below may reference images not displayed]

FINDINGS: Right Kidney:  11.4 cm. No hydronephrosis.  Well-preserved cortex.
Normal size and parenchymal echotexture without focal
abnormalities.

Left Kidney:  11 cm.  Limited visualization. No hydronephrosis.
Well-preserved cortex.  Normal size and parenchymal echotexture
without focal abnormalities.

Bladder:  Decompressed by Foley catheter

A small amount of right infrahepatic free fluid is noted.
IMPRESSION: 1.  Negative for hydronephrosis.
2.  Right perihepatic ascites

## 2012-06-16 ENCOUNTER — Encounter: Payer: Self-pay | Admitting: Medical

## 2012-06-30 ENCOUNTER — Other Ambulatory Visit: Payer: Self-pay | Admitting: *Deleted

## 2012-07-06 ENCOUNTER — Encounter (HOSPITAL_COMMUNITY): Payer: Self-pay | Admitting: Pharmacy Technician

## 2012-07-06 MED ORDER — SODIUM CHLORIDE 0.9 % IJ SOLN: 3.0000 mL | INTRAMUSCULAR | Status: DC | PRN

## 2012-07-07 ENCOUNTER — Ambulatory Visit (HOSPITAL_COMMUNITY)
Admission: RE | Admit: 2012-07-07 | Discharge: 2012-07-07 | Disposition: A | Discharge status: Home / Self Care | Payer: BC Managed Care – PPO | Source: Ambulatory Visit | Attending: Vascular Surgery | Admitting: Vascular Surgery

## 2012-07-07 ENCOUNTER — Other Ambulatory Visit: Payer: Self-pay | Admitting: *Deleted

## 2012-07-07 ENCOUNTER — Telehealth: Payer: Self-pay | Admitting: Vascular Surgery

## 2012-07-07 ENCOUNTER — Encounter (HOSPITAL_COMMUNITY)
Admission: RE | Disposition: A | Discharge status: Home / Self Care | Payer: Self-pay | Source: Ambulatory Visit | Attending: Vascular Surgery

## 2012-07-07 DIAGNOSIS — T82898A Other specified complication of vascular prosthetic devices, implants and grafts, initial encounter: Secondary | ICD-10-CM | POA: Insufficient documentation

## 2012-07-07 DIAGNOSIS — T795XXA Traumatic anuria, initial encounter: Secondary | ICD-10-CM | POA: Insufficient documentation

## 2012-07-07 DIAGNOSIS — Z79899 Other long term (current) drug therapy: Secondary | ICD-10-CM | POA: Insufficient documentation

## 2012-07-07 DIAGNOSIS — I871 Compression of vein: Secondary | ICD-10-CM | POA: Insufficient documentation

## 2012-07-07 DIAGNOSIS — N186 End stage renal disease: Secondary | ICD-10-CM

## 2012-07-07 DIAGNOSIS — Z7982 Long term (current) use of aspirin: Secondary | ICD-10-CM | POA: Insufficient documentation

## 2012-07-07 DIAGNOSIS — Y832 Surgical operation with anastomosis, bypass or graft as the cause of abnormal reaction of the patient, or of later complication, without mention of misadventure at the time of the procedure: Secondary | ICD-10-CM | POA: Insufficient documentation

## 2012-07-07 DIAGNOSIS — D649 Anemia, unspecified: Secondary | ICD-10-CM | POA: Insufficient documentation

## 2012-07-07 DIAGNOSIS — Z992 Dependence on renal dialysis: Secondary | ICD-10-CM | POA: Insufficient documentation

## 2012-07-07 HISTORY — PX: SHUNTOGRAM: SHX5491

## 2012-07-07 LAB — POCT I-STAT, CHEM 8
BUN: 55 mg/dL — ABNORMAL HIGH (ref 6–23)
Calcium, Ion: 0.97 mmol/L — ABNORMAL LOW (ref 1.13–1.30)
Chloride: 97 mEq/L (ref 96–112)
Glucose, Bld: 91 mg/dL (ref 70–99)
HCT: 36 % — ABNORMAL LOW (ref 39.0–52.0)
TCO2: 30 mmol/L (ref 0–100)

## 2012-07-07 SURGERY — ASSESSMENT, SHUNT FUNCTION, WITH CONTRAST RADIOGRAPHIC STUDY
Anesthesia: LOCAL

## 2012-07-07 MED ORDER — ACETAMINOPHEN 325 MG PO TABS: 650.0000 mg | ORAL_TABLET | ORAL | Status: DC | PRN

## 2012-07-07 MED ORDER — ONDANSETRON HCL 4 MG/2ML IJ SOLN: 4.0000 mg | Freq: Four times a day (QID) | INTRAMUSCULAR | Status: DC | PRN

## 2012-07-07 MED ORDER — FENTANYL CITRATE 0.05 MG/ML IJ SOLN
INTRAMUSCULAR | Status: AC
  Filled 2012-07-07: qty 2

## 2012-07-07 MED ORDER — HEPARIN SODIUM (PORCINE) 1000 UNIT/ML IJ SOLN
INTRAMUSCULAR | Status: AC
  Filled 2012-07-07: qty 1

## 2012-07-07 MED ORDER — SODIUM CHLORIDE 0.9 % IJ SOLN: 3.0000 mL | INTRAMUSCULAR | Status: DC | PRN

## 2012-07-07 MED ORDER — SODIUM CHLORIDE 0.9 % IV SOLN: 250.0000 mL | INTRAVENOUS | Status: DC | PRN

## 2012-07-07 MED ORDER — SODIUM CHLORIDE 0.9 % IJ SOLN: 3.0000 mL | Freq: Two times a day (BID) | INTRAMUSCULAR | Status: DC

## 2012-07-07 NOTE — Op Note (Signed)
OPERATIVE NOTE   PROCEDURE: 1.  right brachiocephalic arteriovenous fistula cannulation under ultrasound guidance 2.  right arm fistulogram 3.  Venoplasty of right innominate vein x 3 (6 mm x 60 mm, 8 mm x 40 mm, 10 mm x 40 mm)  PRE-OPERATIVE DIAGNOSIS: Malfunctioning right brachiocephalic arteriovenous fistula  POST-OPERATIVE DIAGNOSIS: same as above   SURGEON: Leonides Sake, MD4CONTRAST: 30 cc  INDICATIONS: John R Muilenburg Sr. is a 62 y.o. male who presents with malfunctioning right brachiocephalic arteriovenous fistula in setting of known right innominate vein high grade stenosis.  The patient is scheduled for right arm fistulogram.  The patient is aware the risks include but are not limited to: bleeding, infection, thrombosis of the cannulated access, and possible anaphylactic reaction to the contrast.  The patient is aware of the risks of the procedure and elects to proceed forward.  DESCRIPTION: After full informed written consent was obtained, the patient was brought back to the angiography suite and placed supine upon the angiography table.  The patient was connected to monitoring equipment.  The right arm was prepped and draped in the standard fashion for a percutaneous access intervention.  Under ultrasound guidance, the right brachiocephalic arteriovenous fistula was cannulated with a micropuncture needle.  The microwire was advanced into the fistula and the needle was exchanged for the a microsheath, which was lodged 2 cm into the access.  The wire was removed and the sheath was connected to the IV extension tubing.  Hand injections were completed to image the access from the antecubitum up to the level of axilla.  The central venous structures were also imaged by hand injections.  Based on the images, this patient will need: venoplasty of the right innominate vein.  A Benson wire was advanced into the axillary vein and the sheath was exchanged for a short 6-Fr sheath.  Based on the the  imaging, a 6 mm x 60 mm angioplasty balloon was selected.  The balloon was centered around the near occlusion and inflated to 24 atm for 2 minutes.  The stenosis was centered on the right second rib.  At this point, the balloon was exchanged for a 8 mm x 40 mm angioplasty balloon.  The balloon was centered around the stenosis and inflated to 24 atm for 2 minutes.  On completion imaging, a >30% residual stenosis is present.  At this point, the balloon was exchanged for a 10 mm x 40 mm angioplasty balloon.  The balloon was centered around the stenosis and inflated to 20 atm for 2 minutes.  On completion imaging, a >30% residual stenosis is present.  I replaced the balloon and inflated it slightly more distal to the stenosis to 24 minutes for 2 minutes.  On completing imaging, a 6 mm flow channel was present so I did not feel further venoplasty was indicated.  The wire and balloon were removed from the sheath.  A 4-0 Monocryl purse-string suture was sewn around the sheath.  The sheath was removed while tying down the suture.  A sterile bandage was applied to the puncture site.  COMPLICATIONS: none  CONDITION: stable  Leonides Sake, MD [facility] Office: [phone] Pager: [phone]  07/07/2012 8:19 AM
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
## 2012-07-07 NOTE — Telephone Encounter (Signed)
Message copied by Margaretmary Eddy on Thu Jul 07, 2012  2:12 PM ------      Message from: Melene Plan      Created: Thu Jul 07, 2012  9:49 AM                   ----- Message -----         From: Fransisco Hertz, MD         Sent: 07/07/2012   8:25 AM           To: Reuel Derby, Melene Plan, RN            John KENDYN ZAMAN Sr.      [id number]      1950/03/13            PROCEDURE:      1.  right brachiocephalic arteriovenous fistula cannulation under ultrasound guidance      2.  right arm fistulogram      3.  Venoplasty of right innominate vein x 3 (6 mm x 60 mm, 8 mm x 40 mm, 10 mm x 40 mm)            Follow-up: 3 months            Orders(s) for follow-up: R arm access duplex       ------

## 2012-07-07 NOTE — Telephone Encounter (Signed)
Lvm, sent letter - kf °

## 2012-09-11 DIAGNOSIS — N186 End stage renal disease: Secondary | ICD-10-CM | POA: Diagnosis not present

## 2012-10-06 ENCOUNTER — Encounter: Payer: Self-pay | Admitting: Vascular Surgery

## 2012-10-07 ENCOUNTER — Ambulatory Visit: Payer: BC Managed Care – PPO | Admitting: Vascular Surgery

## 2012-10-07 ENCOUNTER — Encounter: Payer: Self-pay | Admitting: Vascular Surgery

## 2012-10-07 ENCOUNTER — Ambulatory Visit (INDEPENDENT_AMBULATORY_CARE_PROVIDER_SITE_OTHER): Payer: BC Managed Care – PPO | Admitting: Vascular Surgery

## 2012-10-07 ENCOUNTER — Ambulatory Visit (HOSPITAL_COMMUNITY)
Admission: RE | Admit: 2012-10-07 | Discharge: 2012-10-07 | Disposition: A | Discharge status: Home / Self Care | Payer: BC Managed Care – PPO | Source: Ambulatory Visit | Attending: Vascular Surgery | Admitting: Vascular Surgery

## 2012-10-07 VITALS — BP 112/65 | HR 65 | Ht 71.0 in | Wt 246.0 lb

## 2012-10-07 DIAGNOSIS — N186 End stage renal disease: Secondary | ICD-10-CM

## 2012-10-07 DIAGNOSIS — T82898A Other specified complication of vascular prosthetic devices, implants and grafts, initial encounter: Secondary | ICD-10-CM

## 2012-10-07 DIAGNOSIS — Z4931 Encounter for adequacy testing for hemodialysis: Secondary | ICD-10-CM

## 2012-10-07 NOTE — Progress Notes (Signed)
[facility] [place]  Established Dialysis Access  History of Present Illness  John R Naclerio Sr. is a 62 y.o. (1951/01/06) male who presents for re-evaluation of Right BC AVF.  The patient previously had a venoplasty of right innominate vein (07/07/12).  The pt's previous sx of arm swelling and poor flow rates have improved.  He denies any chest vein hypertrophy or right arm sx at this point.  He has been undergoing HD via the [facility] AVF without any difficulties.  The patient's PMH, PSH, SH, FamHx, Med, and Allergies are unchanged from 07/07/12.  On ROS today: no R arm swelling, no chest wall swelling  Physical Examination  Filed Vitals:   10/07/12 1147  BP: 112/65  Pulse: 65  Height: 5\' 11"  (1.803 m)  Weight: 246 lb (111.585 kg)  SpO2: 100%   Body mass index is 34.33 kg/(m^2).  General: A&O x 3, WD, WN  Pulmonary: Sym exp, good air movt, CTAB, no rales, rhonchi, & wheezing, few visible chest wall veins  Cardiac: RRR, Nl S1, S2, no Murmurs, rubs or gallops  Vascular: Vessel Right Left  Radial Faintly Palpable Palpable  Ulnar Faintly Palpable Faintly Palpable  Brachial Palpable Palpable   Gastrointestinal: soft, NTND, -G/R, - HSM, - masses, - CVAT B  Musculoskeletal: M/S 5/5 throughout , Extremities without  ischemic changes , palpable thrill with strong bruit on ausc., multiple small pseudoaneurysms, tortuous vein  Neurologic: Pain and light touch intact in extremities , Motor exam as listed above  Non-Invasive Vascular Imaging  right arm Access Duplex  (Date: 10/07/2012):   Diameters:  1.1-2.3 cm  PSV:  750 c/s at anastomosis  Medical Decision Making  John R Randol Zumstein. is a 62 y.o. male who presents with ESRD requiring hemodialysis, s/p R innominate vein venoplasty x 2  At this point, no immediate intervention is necessary.  I don't see obvious evidence of restenosis of the R innominate vein.  The pt and I discussed the sx for restenosis including  chest wall vein hypertrophy, R arm swelling, and poor flow rates.  He will call us if he develops any of these issues.  At that point, repeat R arm venogram with repeat R innominate vein venoplasty is likely needed.  Leonides Sake, MD [facility] [place] Office: [phone] Pager: [phone]  10/07/2012, 12:16 PM

## 2012-10-11 DIAGNOSIS — N186 End stage renal disease: Secondary | ICD-10-CM | POA: Diagnosis not present

## 2012-10-30 DIAGNOSIS — N186 End stage renal disease: Secondary | ICD-10-CM | POA: Diagnosis not present

## 2012-11-01 DIAGNOSIS — N186 End stage renal disease: Secondary | ICD-10-CM | POA: Diagnosis not present

## 2012-11-03 DIAGNOSIS — N186 End stage renal disease: Secondary | ICD-10-CM | POA: Diagnosis not present

## 2012-11-06 DIAGNOSIS — N186 End stage renal disease: Secondary | ICD-10-CM | POA: Diagnosis not present

## 2012-11-11 DIAGNOSIS — N186 End stage renal disease: Secondary | ICD-10-CM | POA: Diagnosis not present

## 2012-11-13 DIAGNOSIS — N186 End stage renal disease: Secondary | ICD-10-CM | POA: Diagnosis not present

## 2012-11-15 DIAGNOSIS — N186 End stage renal disease: Secondary | ICD-10-CM | POA: Diagnosis not present

## 2012-11-17 ENCOUNTER — Other Ambulatory Visit: Payer: Self-pay

## 2012-11-17 DIAGNOSIS — N186 End stage renal disease: Secondary | ICD-10-CM | POA: Diagnosis not present

## 2012-11-27 DIAGNOSIS — N186 End stage renal disease: Secondary | ICD-10-CM | POA: Diagnosis not present

## 2012-11-29 DIAGNOSIS — N186 End stage renal disease: Secondary | ICD-10-CM | POA: Diagnosis not present

## 2012-12-01 DIAGNOSIS — N186 End stage renal disease: Secondary | ICD-10-CM | POA: Diagnosis not present

## 2012-12-04 DIAGNOSIS — N186 End stage renal disease: Secondary | ICD-10-CM | POA: Diagnosis not present

## 2012-12-06 DIAGNOSIS — N186 End stage renal disease: Secondary | ICD-10-CM | POA: Diagnosis not present

## 2012-12-08 DIAGNOSIS — N186 End stage renal disease: Secondary | ICD-10-CM | POA: Diagnosis not present

## 2012-12-12 DIAGNOSIS — D631 Anemia in chronic kidney disease: Secondary | ICD-10-CM | POA: Diagnosis not present

## 2012-12-12 DIAGNOSIS — N2581 Secondary hyperparathyroidism of renal origin: Secondary | ICD-10-CM | POA: Diagnosis not present

## 2012-12-12 DIAGNOSIS — D509 Iron deficiency anemia, unspecified: Secondary | ICD-10-CM | POA: Diagnosis not present

## 2012-12-12 DIAGNOSIS — N186 End stage renal disease: Secondary | ICD-10-CM | POA: Diagnosis not present

## 2012-12-13 ENCOUNTER — Encounter (INDEPENDENT_AMBULATORY_CARE_PROVIDER_SITE_OTHER): Payer: Self-pay | Admitting: General Surgery

## 2012-12-13 ENCOUNTER — Ambulatory Visit (INDEPENDENT_AMBULATORY_CARE_PROVIDER_SITE_OTHER): Payer: Medicare Other | Admitting: General Surgery

## 2012-12-13 VITALS — BP 132/82 | HR 68 | Temp 98.9°F | Resp 18 | Ht 72.0 in | Wt 246.0 lb

## 2012-12-13 DIAGNOSIS — K439 Ventral hernia without obstruction or gangrene: Secondary | ICD-10-CM | POA: Diagnosis not present

## 2012-12-13 MED ORDER — OXYCODONE-ACETAMINOPHEN 5-325 MG PO TABS
1.0000 | ORAL_TABLET | Freq: Four times a day (QID) | ORAL | Status: DC | PRN
Start: 2012-12-13 — End: 2013-04-22

## 2012-12-13 NOTE — Progress Notes (Signed)
The patient comes in complaining of bilateral right and lower left lower quadrant abdominal discomfort. This tends to occur later in the day when the patient has been up and about. It is improved with his abdominal binder.  Examination today the patient has a central eventration from his previous biologic mesh repair. There is no actual hernia. There is no evidence of anything being incarcerated. He does not have excruciating pain at this time.  I believe the pain is at the pressure points were the stretching of the previous hernia repair this pulling on his retained fascia. There is nothing surgical to be done about this at this time unless the patient wanted to attempt a possible non-biologic prosthetic mesh repair. They do not want to do that.  The patient is on the transplant list at [facility] and hopefully he will get a kidney transplant soon. Because of this discomfort I will prescribe the patient 40 tablets of Percocet as a one-time scheduled dose

## 2012-12-14 DIAGNOSIS — D631 Anemia in chronic kidney disease: Secondary | ICD-10-CM | POA: Diagnosis not present

## 2012-12-14 DIAGNOSIS — N2581 Secondary hyperparathyroidism of renal origin: Secondary | ICD-10-CM | POA: Diagnosis not present

## 2012-12-14 DIAGNOSIS — D509 Iron deficiency anemia, unspecified: Secondary | ICD-10-CM | POA: Diagnosis not present

## 2012-12-14 DIAGNOSIS — N186 End stage renal disease: Secondary | ICD-10-CM | POA: Diagnosis not present

## 2012-12-16 DIAGNOSIS — D631 Anemia in chronic kidney disease: Secondary | ICD-10-CM | POA: Diagnosis not present

## 2012-12-16 DIAGNOSIS — D509 Iron deficiency anemia, unspecified: Secondary | ICD-10-CM | POA: Diagnosis not present

## 2012-12-16 DIAGNOSIS — N186 End stage renal disease: Secondary | ICD-10-CM | POA: Diagnosis not present

## 2012-12-16 DIAGNOSIS — N2581 Secondary hyperparathyroidism of renal origin: Secondary | ICD-10-CM | POA: Diagnosis not present

## 2012-12-17 DIAGNOSIS — D509 Iron deficiency anemia, unspecified: Secondary | ICD-10-CM | POA: Diagnosis not present

## 2012-12-17 DIAGNOSIS — N2581 Secondary hyperparathyroidism of renal origin: Secondary | ICD-10-CM | POA: Diagnosis not present

## 2012-12-17 DIAGNOSIS — D631 Anemia in chronic kidney disease: Secondary | ICD-10-CM | POA: Diagnosis not present

## 2012-12-17 DIAGNOSIS — N186 End stage renal disease: Secondary | ICD-10-CM | POA: Diagnosis not present

## 2012-12-19 DIAGNOSIS — D509 Iron deficiency anemia, unspecified: Secondary | ICD-10-CM | POA: Diagnosis not present

## 2012-12-19 DIAGNOSIS — N186 End stage renal disease: Secondary | ICD-10-CM | POA: Diagnosis not present

## 2012-12-19 DIAGNOSIS — N2581 Secondary hyperparathyroidism of renal origin: Secondary | ICD-10-CM | POA: Diagnosis not present

## 2012-12-19 DIAGNOSIS — D631 Anemia in chronic kidney disease: Secondary | ICD-10-CM | POA: Diagnosis not present

## 2012-12-21 DIAGNOSIS — D509 Iron deficiency anemia, unspecified: Secondary | ICD-10-CM | POA: Diagnosis not present

## 2012-12-21 DIAGNOSIS — D631 Anemia in chronic kidney disease: Secondary | ICD-10-CM | POA: Diagnosis not present

## 2012-12-21 DIAGNOSIS — N2581 Secondary hyperparathyroidism of renal origin: Secondary | ICD-10-CM | POA: Diagnosis not present

## 2012-12-21 DIAGNOSIS — N186 End stage renal disease: Secondary | ICD-10-CM | POA: Diagnosis not present

## 2012-12-23 DIAGNOSIS — N186 End stage renal disease: Secondary | ICD-10-CM | POA: Diagnosis not present

## 2012-12-23 DIAGNOSIS — D509 Iron deficiency anemia, unspecified: Secondary | ICD-10-CM | POA: Diagnosis not present

## 2012-12-23 DIAGNOSIS — N2581 Secondary hyperparathyroidism of renal origin: Secondary | ICD-10-CM | POA: Diagnosis not present

## 2012-12-23 DIAGNOSIS — D631 Anemia in chronic kidney disease: Secondary | ICD-10-CM | POA: Diagnosis not present

## 2012-12-24 DIAGNOSIS — D509 Iron deficiency anemia, unspecified: Secondary | ICD-10-CM | POA: Diagnosis not present

## 2012-12-24 DIAGNOSIS — D631 Anemia in chronic kidney disease: Secondary | ICD-10-CM | POA: Diagnosis not present

## 2012-12-24 DIAGNOSIS — N2581 Secondary hyperparathyroidism of renal origin: Secondary | ICD-10-CM | POA: Diagnosis not present

## 2012-12-24 DIAGNOSIS — N186 End stage renal disease: Secondary | ICD-10-CM | POA: Diagnosis not present

## 2012-12-26 DIAGNOSIS — D631 Anemia in chronic kidney disease: Secondary | ICD-10-CM | POA: Diagnosis not present

## 2012-12-26 DIAGNOSIS — D509 Iron deficiency anemia, unspecified: Secondary | ICD-10-CM | POA: Diagnosis not present

## 2012-12-26 DIAGNOSIS — N2581 Secondary hyperparathyroidism of renal origin: Secondary | ICD-10-CM | POA: Diagnosis not present

## 2012-12-26 DIAGNOSIS — N186 End stage renal disease: Secondary | ICD-10-CM | POA: Diagnosis not present

## 2012-12-28 DIAGNOSIS — N2581 Secondary hyperparathyroidism of renal origin: Secondary | ICD-10-CM | POA: Diagnosis not present

## 2012-12-28 DIAGNOSIS — D631 Anemia in chronic kidney disease: Secondary | ICD-10-CM | POA: Diagnosis not present

## 2012-12-28 DIAGNOSIS — D509 Iron deficiency anemia, unspecified: Secondary | ICD-10-CM | POA: Diagnosis not present

## 2012-12-28 DIAGNOSIS — N186 End stage renal disease: Secondary | ICD-10-CM | POA: Diagnosis not present

## 2012-12-30 DIAGNOSIS — N186 End stage renal disease: Secondary | ICD-10-CM | POA: Diagnosis not present

## 2012-12-30 DIAGNOSIS — D631 Anemia in chronic kidney disease: Secondary | ICD-10-CM | POA: Diagnosis not present

## 2012-12-30 DIAGNOSIS — D509 Iron deficiency anemia, unspecified: Secondary | ICD-10-CM | POA: Diagnosis not present

## 2012-12-30 DIAGNOSIS — N2581 Secondary hyperparathyroidism of renal origin: Secondary | ICD-10-CM | POA: Diagnosis not present

## 2012-12-31 DIAGNOSIS — N186 End stage renal disease: Secondary | ICD-10-CM | POA: Diagnosis not present

## 2012-12-31 DIAGNOSIS — D509 Iron deficiency anemia, unspecified: Secondary | ICD-10-CM | POA: Diagnosis not present

## 2012-12-31 DIAGNOSIS — D631 Anemia in chronic kidney disease: Secondary | ICD-10-CM | POA: Diagnosis not present

## 2012-12-31 DIAGNOSIS — N2581 Secondary hyperparathyroidism of renal origin: Secondary | ICD-10-CM | POA: Diagnosis not present

## 2013-01-02 DIAGNOSIS — D509 Iron deficiency anemia, unspecified: Secondary | ICD-10-CM | POA: Diagnosis not present

## 2013-01-02 DIAGNOSIS — N2581 Secondary hyperparathyroidism of renal origin: Secondary | ICD-10-CM | POA: Diagnosis not present

## 2013-01-02 DIAGNOSIS — D631 Anemia in chronic kidney disease: Secondary | ICD-10-CM | POA: Diagnosis not present

## 2013-01-02 DIAGNOSIS — N186 End stage renal disease: Secondary | ICD-10-CM | POA: Diagnosis not present

## 2013-01-04 DIAGNOSIS — D509 Iron deficiency anemia, unspecified: Secondary | ICD-10-CM | POA: Diagnosis not present

## 2013-01-04 DIAGNOSIS — N2581 Secondary hyperparathyroidism of renal origin: Secondary | ICD-10-CM | POA: Diagnosis not present

## 2013-01-04 DIAGNOSIS — D631 Anemia in chronic kidney disease: Secondary | ICD-10-CM | POA: Diagnosis not present

## 2013-01-04 DIAGNOSIS — N186 End stage renal disease: Secondary | ICD-10-CM | POA: Diagnosis not present

## 2013-01-06 DIAGNOSIS — N2581 Secondary hyperparathyroidism of renal origin: Secondary | ICD-10-CM | POA: Diagnosis not present

## 2013-01-06 DIAGNOSIS — D509 Iron deficiency anemia, unspecified: Secondary | ICD-10-CM | POA: Diagnosis not present

## 2013-01-06 DIAGNOSIS — D631 Anemia in chronic kidney disease: Secondary | ICD-10-CM | POA: Diagnosis not present

## 2013-01-06 DIAGNOSIS — N186 End stage renal disease: Secondary | ICD-10-CM | POA: Diagnosis not present

## 2013-01-07 DIAGNOSIS — D509 Iron deficiency anemia, unspecified: Secondary | ICD-10-CM | POA: Diagnosis not present

## 2013-01-07 DIAGNOSIS — N186 End stage renal disease: Secondary | ICD-10-CM | POA: Diagnosis not present

## 2013-01-07 DIAGNOSIS — D631 Anemia in chronic kidney disease: Secondary | ICD-10-CM | POA: Diagnosis not present

## 2013-01-07 DIAGNOSIS — N2581 Secondary hyperparathyroidism of renal origin: Secondary | ICD-10-CM | POA: Diagnosis not present

## 2013-01-09 DIAGNOSIS — D631 Anemia in chronic kidney disease: Secondary | ICD-10-CM | POA: Diagnosis not present

## 2013-01-09 DIAGNOSIS — N186 End stage renal disease: Secondary | ICD-10-CM | POA: Diagnosis not present

## 2013-01-09 DIAGNOSIS — D509 Iron deficiency anemia, unspecified: Secondary | ICD-10-CM | POA: Diagnosis not present

## 2013-01-09 DIAGNOSIS — N2581 Secondary hyperparathyroidism of renal origin: Secondary | ICD-10-CM | POA: Diagnosis not present

## 2013-01-11 DIAGNOSIS — N186 End stage renal disease: Secondary | ICD-10-CM | POA: Diagnosis not present

## 2013-01-11 DIAGNOSIS — D631 Anemia in chronic kidney disease: Secondary | ICD-10-CM | POA: Diagnosis not present

## 2013-01-11 DIAGNOSIS — D509 Iron deficiency anemia, unspecified: Secondary | ICD-10-CM | POA: Diagnosis not present

## 2013-01-11 DIAGNOSIS — N2581 Secondary hyperparathyroidism of renal origin: Secondary | ICD-10-CM | POA: Diagnosis not present

## 2013-01-13 DIAGNOSIS — E878 Other disorders of electrolyte and fluid balance, not elsewhere classified: Secondary | ICD-10-CM | POA: Diagnosis not present

## 2013-01-13 DIAGNOSIS — N186 End stage renal disease: Secondary | ICD-10-CM | POA: Diagnosis not present

## 2013-01-13 DIAGNOSIS — D631 Anemia in chronic kidney disease: Secondary | ICD-10-CM | POA: Diagnosis not present

## 2013-01-13 DIAGNOSIS — I9589 Other hypotension: Secondary | ICD-10-CM | POA: Diagnosis not present

## 2013-01-23 DIAGNOSIS — N186 End stage renal disease: Secondary | ICD-10-CM | POA: Diagnosis not present

## 2013-02-11 DIAGNOSIS — N186 End stage renal disease: Secondary | ICD-10-CM | POA: Diagnosis not present

## 2013-02-12 DIAGNOSIS — N039 Chronic nephritic syndrome with unspecified morphologic changes: Secondary | ICD-10-CM | POA: Diagnosis not present

## 2013-02-12 DIAGNOSIS — D631 Anemia in chronic kidney disease: Secondary | ICD-10-CM | POA: Diagnosis not present

## 2013-02-12 DIAGNOSIS — E878 Other disorders of electrolyte and fluid balance, not elsewhere classified: Secondary | ICD-10-CM | POA: Diagnosis not present

## 2013-02-12 DIAGNOSIS — I9589 Other hypotension: Secondary | ICD-10-CM | POA: Diagnosis not present

## 2013-02-12 DIAGNOSIS — N186 End stage renal disease: Secondary | ICD-10-CM | POA: Diagnosis not present

## 2013-02-13 DIAGNOSIS — E878 Other disorders of electrolyte and fluid balance, not elsewhere classified: Secondary | ICD-10-CM | POA: Diagnosis not present

## 2013-02-13 DIAGNOSIS — D631 Anemia in chronic kidney disease: Secondary | ICD-10-CM | POA: Diagnosis not present

## 2013-02-13 DIAGNOSIS — I9589 Other hypotension: Secondary | ICD-10-CM | POA: Diagnosis not present

## 2013-02-13 DIAGNOSIS — N186 End stage renal disease: Secondary | ICD-10-CM | POA: Diagnosis not present

## 2013-02-15 DIAGNOSIS — N186 End stage renal disease: Secondary | ICD-10-CM | POA: Diagnosis not present

## 2013-02-15 DIAGNOSIS — I871 Compression of vein: Secondary | ICD-10-CM | POA: Diagnosis not present

## 2013-02-15 DIAGNOSIS — T82898A Other specified complication of vascular prosthetic devices, implants and grafts, initial encounter: Secondary | ICD-10-CM | POA: Diagnosis not present

## 2013-02-16 DIAGNOSIS — E878 Other disorders of electrolyte and fluid balance, not elsewhere classified: Secondary | ICD-10-CM | POA: Diagnosis not present

## 2013-02-16 DIAGNOSIS — D631 Anemia in chronic kidney disease: Secondary | ICD-10-CM | POA: Diagnosis not present

## 2013-02-16 DIAGNOSIS — I9589 Other hypotension: Secondary | ICD-10-CM | POA: Diagnosis not present

## 2013-02-16 DIAGNOSIS — N186 End stage renal disease: Secondary | ICD-10-CM | POA: Diagnosis not present

## 2013-02-17 DIAGNOSIS — N039 Chronic nephritic syndrome with unspecified morphologic changes: Secondary | ICD-10-CM | POA: Diagnosis not present

## 2013-02-17 DIAGNOSIS — D631 Anemia in chronic kidney disease: Secondary | ICD-10-CM | POA: Diagnosis not present

## 2013-02-17 DIAGNOSIS — N186 End stage renal disease: Secondary | ICD-10-CM | POA: Diagnosis not present

## 2013-02-17 DIAGNOSIS — E878 Other disorders of electrolyte and fluid balance, not elsewhere classified: Secondary | ICD-10-CM | POA: Diagnosis not present

## 2013-02-17 DIAGNOSIS — I9589 Other hypotension: Secondary | ICD-10-CM | POA: Diagnosis not present

## 2013-02-18 DIAGNOSIS — D631 Anemia in chronic kidney disease: Secondary | ICD-10-CM | POA: Diagnosis not present

## 2013-02-18 DIAGNOSIS — N186 End stage renal disease: Secondary | ICD-10-CM | POA: Diagnosis not present

## 2013-02-18 DIAGNOSIS — E878 Other disorders of electrolyte and fluid balance, not elsewhere classified: Secondary | ICD-10-CM | POA: Diagnosis not present

## 2013-02-18 DIAGNOSIS — I9589 Other hypotension: Secondary | ICD-10-CM | POA: Diagnosis not present

## 2013-02-20 DIAGNOSIS — D631 Anemia in chronic kidney disease: Secondary | ICD-10-CM | POA: Diagnosis not present

## 2013-02-20 DIAGNOSIS — I9589 Other hypotension: Secondary | ICD-10-CM | POA: Diagnosis not present

## 2013-02-20 DIAGNOSIS — E878 Other disorders of electrolyte and fluid balance, not elsewhere classified: Secondary | ICD-10-CM | POA: Diagnosis not present

## 2013-02-20 DIAGNOSIS — N186 End stage renal disease: Secondary | ICD-10-CM | POA: Diagnosis not present

## 2013-02-21 DIAGNOSIS — D631 Anemia in chronic kidney disease: Secondary | ICD-10-CM | POA: Diagnosis not present

## 2013-02-21 DIAGNOSIS — N186 End stage renal disease: Secondary | ICD-10-CM | POA: Diagnosis not present

## 2013-02-21 DIAGNOSIS — I9589 Other hypotension: Secondary | ICD-10-CM | POA: Diagnosis not present

## 2013-02-21 DIAGNOSIS — E878 Other disorders of electrolyte and fluid balance, not elsewhere classified: Secondary | ICD-10-CM | POA: Diagnosis not present

## 2013-02-23 DIAGNOSIS — E878 Other disorders of electrolyte and fluid balance, not elsewhere classified: Secondary | ICD-10-CM | POA: Diagnosis not present

## 2013-02-23 DIAGNOSIS — N186 End stage renal disease: Secondary | ICD-10-CM | POA: Diagnosis not present

## 2013-02-23 DIAGNOSIS — I9589 Other hypotension: Secondary | ICD-10-CM | POA: Diagnosis not present

## 2013-02-23 DIAGNOSIS — D631 Anemia in chronic kidney disease: Secondary | ICD-10-CM | POA: Diagnosis not present

## 2013-02-23 DIAGNOSIS — N039 Chronic nephritic syndrome with unspecified morphologic changes: Secondary | ICD-10-CM | POA: Diagnosis not present

## 2013-02-24 DIAGNOSIS — E878 Other disorders of electrolyte and fluid balance, not elsewhere classified: Secondary | ICD-10-CM | POA: Diagnosis not present

## 2013-02-24 DIAGNOSIS — I9589 Other hypotension: Secondary | ICD-10-CM | POA: Diagnosis not present

## 2013-02-24 DIAGNOSIS — N186 End stage renal disease: Secondary | ICD-10-CM | POA: Diagnosis not present

## 2013-02-24 DIAGNOSIS — N039 Chronic nephritic syndrome with unspecified morphologic changes: Secondary | ICD-10-CM | POA: Diagnosis not present

## 2013-02-24 DIAGNOSIS — D631 Anemia in chronic kidney disease: Secondary | ICD-10-CM | POA: Diagnosis not present

## 2013-02-26 DIAGNOSIS — D631 Anemia in chronic kidney disease: Secondary | ICD-10-CM | POA: Diagnosis not present

## 2013-02-26 DIAGNOSIS — I9589 Other hypotension: Secondary | ICD-10-CM | POA: Diagnosis not present

## 2013-02-26 DIAGNOSIS — N186 End stage renal disease: Secondary | ICD-10-CM | POA: Diagnosis not present

## 2013-02-26 DIAGNOSIS — E878 Other disorders of electrolyte and fluid balance, not elsewhere classified: Secondary | ICD-10-CM | POA: Diagnosis not present

## 2013-02-26 DIAGNOSIS — N039 Chronic nephritic syndrome with unspecified morphologic changes: Secondary | ICD-10-CM | POA: Diagnosis not present

## 2013-02-27 DIAGNOSIS — D631 Anemia in chronic kidney disease: Secondary | ICD-10-CM | POA: Diagnosis not present

## 2013-02-27 DIAGNOSIS — N186 End stage renal disease: Secondary | ICD-10-CM | POA: Diagnosis not present

## 2013-02-27 DIAGNOSIS — E878 Other disorders of electrolyte and fluid balance, not elsewhere classified: Secondary | ICD-10-CM | POA: Diagnosis not present

## 2013-02-27 DIAGNOSIS — I9589 Other hypotension: Secondary | ICD-10-CM | POA: Diagnosis not present

## 2013-02-28 DIAGNOSIS — E878 Other disorders of electrolyte and fluid balance, not elsewhere classified: Secondary | ICD-10-CM | POA: Diagnosis not present

## 2013-02-28 DIAGNOSIS — I9589 Other hypotension: Secondary | ICD-10-CM | POA: Diagnosis not present

## 2013-02-28 DIAGNOSIS — N186 End stage renal disease: Secondary | ICD-10-CM | POA: Diagnosis not present

## 2013-02-28 DIAGNOSIS — D631 Anemia in chronic kidney disease: Secondary | ICD-10-CM | POA: Diagnosis not present

## 2013-03-02 DIAGNOSIS — D631 Anemia in chronic kidney disease: Secondary | ICD-10-CM | POA: Diagnosis not present

## 2013-03-02 DIAGNOSIS — N186 End stage renal disease: Secondary | ICD-10-CM | POA: Diagnosis not present

## 2013-03-02 DIAGNOSIS — I9589 Other hypotension: Secondary | ICD-10-CM | POA: Diagnosis not present

## 2013-03-02 DIAGNOSIS — N039 Chronic nephritic syndrome with unspecified morphologic changes: Secondary | ICD-10-CM | POA: Diagnosis not present

## 2013-03-02 DIAGNOSIS — E878 Other disorders of electrolyte and fluid balance, not elsewhere classified: Secondary | ICD-10-CM | POA: Diagnosis not present

## 2013-03-03 DIAGNOSIS — N039 Chronic nephritic syndrome with unspecified morphologic changes: Secondary | ICD-10-CM | POA: Diagnosis not present

## 2013-03-03 DIAGNOSIS — N186 End stage renal disease: Secondary | ICD-10-CM | POA: Diagnosis not present

## 2013-03-03 DIAGNOSIS — D631 Anemia in chronic kidney disease: Secondary | ICD-10-CM | POA: Diagnosis not present

## 2013-03-03 DIAGNOSIS — I9589 Other hypotension: Secondary | ICD-10-CM | POA: Diagnosis not present

## 2013-03-03 DIAGNOSIS — E878 Other disorders of electrolyte and fluid balance, not elsewhere classified: Secondary | ICD-10-CM | POA: Diagnosis not present

## 2013-03-05 DIAGNOSIS — N186 End stage renal disease: Secondary | ICD-10-CM | POA: Diagnosis not present

## 2013-03-05 DIAGNOSIS — I9589 Other hypotension: Secondary | ICD-10-CM | POA: Diagnosis not present

## 2013-03-05 DIAGNOSIS — E878 Other disorders of electrolyte and fluid balance, not elsewhere classified: Secondary | ICD-10-CM | POA: Diagnosis not present

## 2013-03-05 DIAGNOSIS — D631 Anemia in chronic kidney disease: Secondary | ICD-10-CM | POA: Diagnosis not present

## 2013-03-06 DIAGNOSIS — N039 Chronic nephritic syndrome with unspecified morphologic changes: Secondary | ICD-10-CM | POA: Diagnosis not present

## 2013-03-06 DIAGNOSIS — I9589 Other hypotension: Secondary | ICD-10-CM | POA: Diagnosis not present

## 2013-03-06 DIAGNOSIS — D631 Anemia in chronic kidney disease: Secondary | ICD-10-CM | POA: Diagnosis not present

## 2013-03-06 DIAGNOSIS — N186 End stage renal disease: Secondary | ICD-10-CM | POA: Diagnosis not present

## 2013-03-06 DIAGNOSIS — E878 Other disorders of electrolyte and fluid balance, not elsewhere classified: Secondary | ICD-10-CM | POA: Diagnosis not present

## 2013-03-07 DIAGNOSIS — E878 Other disorders of electrolyte and fluid balance, not elsewhere classified: Secondary | ICD-10-CM | POA: Diagnosis not present

## 2013-03-07 DIAGNOSIS — D631 Anemia in chronic kidney disease: Secondary | ICD-10-CM | POA: Diagnosis not present

## 2013-03-07 DIAGNOSIS — N039 Chronic nephritic syndrome with unspecified morphologic changes: Secondary | ICD-10-CM | POA: Diagnosis not present

## 2013-03-07 DIAGNOSIS — I9589 Other hypotension: Secondary | ICD-10-CM | POA: Diagnosis not present

## 2013-03-07 DIAGNOSIS — N186 End stage renal disease: Secondary | ICD-10-CM | POA: Diagnosis not present

## 2013-03-09 DIAGNOSIS — N186 End stage renal disease: Secondary | ICD-10-CM | POA: Diagnosis not present

## 2013-03-09 DIAGNOSIS — I9589 Other hypotension: Secondary | ICD-10-CM | POA: Diagnosis not present

## 2013-03-09 DIAGNOSIS — D631 Anemia in chronic kidney disease: Secondary | ICD-10-CM | POA: Diagnosis not present

## 2013-03-09 DIAGNOSIS — E878 Other disorders of electrolyte and fluid balance, not elsewhere classified: Secondary | ICD-10-CM | POA: Diagnosis not present

## 2013-03-10 DIAGNOSIS — I9589 Other hypotension: Secondary | ICD-10-CM | POA: Diagnosis not present

## 2013-03-10 DIAGNOSIS — E878 Other disorders of electrolyte and fluid balance, not elsewhere classified: Secondary | ICD-10-CM | POA: Diagnosis not present

## 2013-03-10 DIAGNOSIS — D631 Anemia in chronic kidney disease: Secondary | ICD-10-CM | POA: Diagnosis not present

## 2013-03-10 DIAGNOSIS — N186 End stage renal disease: Secondary | ICD-10-CM | POA: Diagnosis not present

## 2013-03-10 DIAGNOSIS — N039 Chronic nephritic syndrome with unspecified morphologic changes: Secondary | ICD-10-CM | POA: Diagnosis not present

## 2013-03-11 DIAGNOSIS — T82898A Other specified complication of vascular prosthetic devices, implants and grafts, initial encounter: Secondary | ICD-10-CM | POA: Diagnosis not present

## 2013-03-12 DIAGNOSIS — I9589 Other hypotension: Secondary | ICD-10-CM | POA: Diagnosis not present

## 2013-03-12 DIAGNOSIS — D631 Anemia in chronic kidney disease: Secondary | ICD-10-CM | POA: Diagnosis not present

## 2013-03-12 DIAGNOSIS — N039 Chronic nephritic syndrome with unspecified morphologic changes: Secondary | ICD-10-CM | POA: Diagnosis not present

## 2013-03-12 DIAGNOSIS — N186 End stage renal disease: Secondary | ICD-10-CM | POA: Diagnosis not present

## 2013-03-12 DIAGNOSIS — E878 Other disorders of electrolyte and fluid balance, not elsewhere classified: Secondary | ICD-10-CM | POA: Diagnosis not present

## 2013-03-13 DIAGNOSIS — E878 Other disorders of electrolyte and fluid balance, not elsewhere classified: Secondary | ICD-10-CM | POA: Diagnosis not present

## 2013-03-13 DIAGNOSIS — I9589 Other hypotension: Secondary | ICD-10-CM | POA: Diagnosis not present

## 2013-03-13 DIAGNOSIS — D631 Anemia in chronic kidney disease: Secondary | ICD-10-CM | POA: Diagnosis not present

## 2013-03-13 DIAGNOSIS — N186 End stage renal disease: Secondary | ICD-10-CM | POA: Diagnosis not present

## 2013-03-13 DIAGNOSIS — N039 Chronic nephritic syndrome with unspecified morphologic changes: Secondary | ICD-10-CM | POA: Diagnosis not present

## 2013-03-14 DIAGNOSIS — I9589 Other hypotension: Secondary | ICD-10-CM | POA: Diagnosis not present

## 2013-03-14 DIAGNOSIS — D631 Anemia in chronic kidney disease: Secondary | ICD-10-CM | POA: Diagnosis not present

## 2013-03-14 DIAGNOSIS — N039 Chronic nephritic syndrome with unspecified morphologic changes: Secondary | ICD-10-CM | POA: Diagnosis not present

## 2013-03-14 DIAGNOSIS — E878 Other disorders of electrolyte and fluid balance, not elsewhere classified: Secondary | ICD-10-CM | POA: Diagnosis not present

## 2013-03-14 DIAGNOSIS — N186 End stage renal disease: Secondary | ICD-10-CM | POA: Diagnosis not present

## 2013-03-17 DIAGNOSIS — D631 Anemia in chronic kidney disease: Secondary | ICD-10-CM | POA: Diagnosis not present

## 2013-03-17 DIAGNOSIS — N186 End stage renal disease: Secondary | ICD-10-CM | POA: Diagnosis not present

## 2013-03-17 DIAGNOSIS — I9589 Other hypotension: Secondary | ICD-10-CM | POA: Diagnosis not present

## 2013-03-17 DIAGNOSIS — E878 Other disorders of electrolyte and fluid balance, not elsewhere classified: Secondary | ICD-10-CM | POA: Diagnosis not present

## 2013-03-18 DIAGNOSIS — D631 Anemia in chronic kidney disease: Secondary | ICD-10-CM | POA: Diagnosis not present

## 2013-03-18 DIAGNOSIS — N186 End stage renal disease: Secondary | ICD-10-CM | POA: Diagnosis not present

## 2013-03-18 DIAGNOSIS — I9589 Other hypotension: Secondary | ICD-10-CM | POA: Diagnosis not present

## 2013-03-18 DIAGNOSIS — E878 Other disorders of electrolyte and fluid balance, not elsewhere classified: Secondary | ICD-10-CM | POA: Diagnosis not present

## 2013-03-21 DIAGNOSIS — E878 Other disorders of electrolyte and fluid balance, not elsewhere classified: Secondary | ICD-10-CM | POA: Diagnosis not present

## 2013-03-21 DIAGNOSIS — N186 End stage renal disease: Secondary | ICD-10-CM | POA: Diagnosis not present

## 2013-03-21 DIAGNOSIS — I9589 Other hypotension: Secondary | ICD-10-CM | POA: Diagnosis not present

## 2013-03-21 DIAGNOSIS — D631 Anemia in chronic kidney disease: Secondary | ICD-10-CM | POA: Diagnosis not present

## 2013-03-23 DIAGNOSIS — I9589 Other hypotension: Secondary | ICD-10-CM | POA: Diagnosis not present

## 2013-03-23 DIAGNOSIS — E878 Other disorders of electrolyte and fluid balance, not elsewhere classified: Secondary | ICD-10-CM | POA: Diagnosis not present

## 2013-03-23 DIAGNOSIS — N186 End stage renal disease: Secondary | ICD-10-CM | POA: Diagnosis not present

## 2013-03-23 DIAGNOSIS — N039 Chronic nephritic syndrome with unspecified morphologic changes: Secondary | ICD-10-CM | POA: Diagnosis not present

## 2013-03-23 DIAGNOSIS — D631 Anemia in chronic kidney disease: Secondary | ICD-10-CM | POA: Diagnosis not present

## 2013-03-24 DIAGNOSIS — D631 Anemia in chronic kidney disease: Secondary | ICD-10-CM | POA: Diagnosis not present

## 2013-03-24 DIAGNOSIS — I9589 Other hypotension: Secondary | ICD-10-CM | POA: Diagnosis not present

## 2013-03-24 DIAGNOSIS — E878 Other disorders of electrolyte and fluid balance, not elsewhere classified: Secondary | ICD-10-CM | POA: Diagnosis not present

## 2013-03-24 DIAGNOSIS — N186 End stage renal disease: Secondary | ICD-10-CM | POA: Diagnosis not present

## 2013-03-25 DIAGNOSIS — D631 Anemia in chronic kidney disease: Secondary | ICD-10-CM | POA: Diagnosis not present

## 2013-03-25 DIAGNOSIS — E878 Other disorders of electrolyte and fluid balance, not elsewhere classified: Secondary | ICD-10-CM | POA: Diagnosis not present

## 2013-03-25 DIAGNOSIS — N186 End stage renal disease: Secondary | ICD-10-CM | POA: Diagnosis not present

## 2013-03-25 DIAGNOSIS — I9589 Other hypotension: Secondary | ICD-10-CM | POA: Diagnosis not present

## 2013-03-27 DIAGNOSIS — D631 Anemia in chronic kidney disease: Secondary | ICD-10-CM | POA: Diagnosis not present

## 2013-03-27 DIAGNOSIS — N186 End stage renal disease: Secondary | ICD-10-CM | POA: Diagnosis not present

## 2013-03-27 DIAGNOSIS — I9589 Other hypotension: Secondary | ICD-10-CM | POA: Diagnosis not present

## 2013-03-27 DIAGNOSIS — E878 Other disorders of electrolyte and fluid balance, not elsewhere classified: Secondary | ICD-10-CM | POA: Diagnosis not present

## 2013-03-27 DIAGNOSIS — N039 Chronic nephritic syndrome with unspecified morphologic changes: Secondary | ICD-10-CM | POA: Diagnosis not present

## 2013-03-29 DIAGNOSIS — D631 Anemia in chronic kidney disease: Secondary | ICD-10-CM | POA: Diagnosis not present

## 2013-03-29 DIAGNOSIS — E878 Other disorders of electrolyte and fluid balance, not elsewhere classified: Secondary | ICD-10-CM | POA: Diagnosis not present

## 2013-03-29 DIAGNOSIS — I9589 Other hypotension: Secondary | ICD-10-CM | POA: Diagnosis not present

## 2013-03-29 DIAGNOSIS — N186 End stage renal disease: Secondary | ICD-10-CM | POA: Diagnosis not present

## 2013-03-31 DIAGNOSIS — N186 End stage renal disease: Secondary | ICD-10-CM | POA: Diagnosis not present

## 2013-03-31 DIAGNOSIS — I9589 Other hypotension: Secondary | ICD-10-CM | POA: Diagnosis not present

## 2013-03-31 DIAGNOSIS — D631 Anemia in chronic kidney disease: Secondary | ICD-10-CM | POA: Diagnosis not present

## 2013-03-31 DIAGNOSIS — E878 Other disorders of electrolyte and fluid balance, not elsewhere classified: Secondary | ICD-10-CM | POA: Diagnosis not present

## 2013-04-02 DIAGNOSIS — N186 End stage renal disease: Secondary | ICD-10-CM | POA: Diagnosis not present

## 2013-04-02 DIAGNOSIS — D631 Anemia in chronic kidney disease: Secondary | ICD-10-CM | POA: Diagnosis not present

## 2013-04-02 DIAGNOSIS — E878 Other disorders of electrolyte and fluid balance, not elsewhere classified: Secondary | ICD-10-CM | POA: Diagnosis not present

## 2013-04-02 DIAGNOSIS — I9589 Other hypotension: Secondary | ICD-10-CM | POA: Diagnosis not present

## 2013-04-02 DIAGNOSIS — N039 Chronic nephritic syndrome with unspecified morphologic changes: Secondary | ICD-10-CM | POA: Diagnosis not present

## 2013-04-04 DIAGNOSIS — D631 Anemia in chronic kidney disease: Secondary | ICD-10-CM | POA: Diagnosis not present

## 2013-04-04 DIAGNOSIS — I9589 Other hypotension: Secondary | ICD-10-CM | POA: Diagnosis not present

## 2013-04-04 DIAGNOSIS — N186 End stage renal disease: Secondary | ICD-10-CM | POA: Diagnosis not present

## 2013-04-04 DIAGNOSIS — E878 Other disorders of electrolyte and fluid balance, not elsewhere classified: Secondary | ICD-10-CM | POA: Diagnosis not present

## 2013-04-07 DIAGNOSIS — I9589 Other hypotension: Secondary | ICD-10-CM | POA: Diagnosis not present

## 2013-04-07 DIAGNOSIS — N186 End stage renal disease: Secondary | ICD-10-CM | POA: Diagnosis not present

## 2013-04-07 DIAGNOSIS — E878 Other disorders of electrolyte and fluid balance, not elsewhere classified: Secondary | ICD-10-CM | POA: Diagnosis not present

## 2013-04-07 DIAGNOSIS — D631 Anemia in chronic kidney disease: Secondary | ICD-10-CM | POA: Diagnosis not present

## 2013-04-10 DIAGNOSIS — D631 Anemia in chronic kidney disease: Secondary | ICD-10-CM | POA: Diagnosis not present

## 2013-04-10 DIAGNOSIS — N186 End stage renal disease: Secondary | ICD-10-CM | POA: Diagnosis not present

## 2013-04-10 DIAGNOSIS — E878 Other disorders of electrolyte and fluid balance, not elsewhere classified: Secondary | ICD-10-CM | POA: Diagnosis not present

## 2013-04-10 DIAGNOSIS — I9589 Other hypotension: Secondary | ICD-10-CM | POA: Diagnosis not present

## 2013-04-11 DIAGNOSIS — N186 End stage renal disease: Secondary | ICD-10-CM | POA: Diagnosis not present

## 2013-04-12 DIAGNOSIS — E878 Other disorders of electrolyte and fluid balance, not elsewhere classified: Secondary | ICD-10-CM | POA: Diagnosis not present

## 2013-04-12 DIAGNOSIS — D631 Anemia in chronic kidney disease: Secondary | ICD-10-CM | POA: Diagnosis not present

## 2013-04-12 DIAGNOSIS — I252 Old myocardial infarction: Secondary | ICD-10-CM

## 2013-04-12 DIAGNOSIS — I9589 Other hypotension: Secondary | ICD-10-CM | POA: Diagnosis not present

## 2013-04-12 DIAGNOSIS — N186 End stage renal disease: Secondary | ICD-10-CM | POA: Diagnosis not present

## 2013-04-12 HISTORY — DX: Old myocardial infarction: I25.2

## 2013-04-22 ENCOUNTER — Inpatient Hospital Stay (HOSPITAL_COMMUNITY): Payer: Medicare Other

## 2013-04-22 ENCOUNTER — Emergency Department (HOSPITAL_COMMUNITY): Payer: Medicare Other

## 2013-04-22 ENCOUNTER — Encounter (HOSPITAL_COMMUNITY): Payer: Self-pay | Admitting: Emergency Medicine

## 2013-04-22 ENCOUNTER — Inpatient Hospital Stay (HOSPITAL_COMMUNITY)
Admission: EM | Admit: 2013-04-22 | Discharge: 2013-05-03 | DRG: 853 | Disposition: A | Discharge status: Home / Self Care | Payer: Medicare Other | Attending: Pulmonary Disease | Admitting: Pulmonary Disease

## 2013-04-22 DIAGNOSIS — E872 Acidosis, unspecified: Secondary | ICD-10-CM | POA: Diagnosis not present

## 2013-04-22 DIAGNOSIS — R040 Epistaxis: Secondary | ICD-10-CM | POA: Diagnosis not present

## 2013-04-22 DIAGNOSIS — T82867A Thrombosis of cardiac prosthetic devices, implants and grafts, initial encounter: Secondary | ICD-10-CM

## 2013-04-22 DIAGNOSIS — A419 Sepsis, unspecified organism: Principal | ICD-10-CM | POA: Diagnosis present

## 2013-04-22 DIAGNOSIS — I428 Other cardiomyopathies: Secondary | ICD-10-CM | POA: Diagnosis not present

## 2013-04-22 DIAGNOSIS — R6521 Severe sepsis with septic shock: Secondary | ICD-10-CM | POA: Diagnosis present

## 2013-04-22 DIAGNOSIS — K2980 Duodenitis without bleeding: Secondary | ICD-10-CM | POA: Diagnosis present

## 2013-04-22 DIAGNOSIS — T82897A Other specified complication of cardiac prosthetic devices, implants and grafts, initial encounter: Secondary | ICD-10-CM | POA: Diagnosis present

## 2013-04-22 DIAGNOSIS — I059 Rheumatic mitral valve disease, unspecified: Secondary | ICD-10-CM

## 2013-04-22 DIAGNOSIS — Z992 Dependence on renal dialysis: Secondary | ICD-10-CM | POA: Diagnosis not present

## 2013-04-22 DIAGNOSIS — J9600 Acute respiratory failure, unspecified whether with hypoxia or hypercapnia: Secondary | ICD-10-CM | POA: Diagnosis not present

## 2013-04-22 DIAGNOSIS — Z8249 Family history of ischemic heart disease and other diseases of the circulatory system: Secondary | ICD-10-CM | POA: Diagnosis not present

## 2013-04-22 DIAGNOSIS — I214 Non-ST elevation (NSTEMI) myocardial infarction: Secondary | ICD-10-CM | POA: Diagnosis not present

## 2013-04-22 DIAGNOSIS — J9819 Other pulmonary collapse: Secondary | ICD-10-CM | POA: Diagnosis not present

## 2013-04-22 DIAGNOSIS — J90 Pleural effusion, not elsewhere classified: Secondary | ICD-10-CM | POA: Diagnosis present

## 2013-04-22 DIAGNOSIS — Y849 Medical procedure, unspecified as the cause of abnormal reaction of the patient, or of later complication, without mention of misadventure at the time of the procedure: Secondary | ICD-10-CM | POA: Diagnosis present

## 2013-04-22 DIAGNOSIS — J811 Chronic pulmonary edema: Secondary | ICD-10-CM | POA: Diagnosis not present

## 2013-04-22 DIAGNOSIS — Z79899 Other long term (current) drug therapy: Secondary | ICD-10-CM

## 2013-04-22 DIAGNOSIS — I509 Heart failure, unspecified: Secondary | ICD-10-CM | POA: Diagnosis not present

## 2013-04-22 DIAGNOSIS — Z933 Colostomy status: Secondary | ICD-10-CM

## 2013-04-22 DIAGNOSIS — D649 Anemia, unspecified: Secondary | ICD-10-CM

## 2013-04-22 DIAGNOSIS — R0989 Other specified symptoms and signs involving the circulatory and respiratory systems: Secondary | ICD-10-CM | POA: Diagnosis not present

## 2013-04-22 DIAGNOSIS — Z452 Encounter for adjustment and management of vascular access device: Secondary | ICD-10-CM | POA: Diagnosis not present

## 2013-04-22 DIAGNOSIS — K859 Acute pancreatitis without necrosis or infection, unspecified: Secondary | ICD-10-CM | POA: Diagnosis present

## 2013-04-22 DIAGNOSIS — I2582 Chronic total occlusion of coronary artery: Secondary | ICD-10-CM | POA: Diagnosis present

## 2013-04-22 DIAGNOSIS — N2581 Secondary hyperparathyroidism of renal origin: Secondary | ICD-10-CM | POA: Diagnosis present

## 2013-04-22 DIAGNOSIS — I951 Orthostatic hypotension: Secondary | ICD-10-CM | POA: Diagnosis not present

## 2013-04-22 DIAGNOSIS — N186 End stage renal disease: Secondary | ICD-10-CM | POA: Diagnosis not present

## 2013-04-22 DIAGNOSIS — J8410 Pulmonary fibrosis, unspecified: Secondary | ICD-10-CM | POA: Diagnosis not present

## 2013-04-22 DIAGNOSIS — I2510 Atherosclerotic heart disease of native coronary artery without angina pectoris: Secondary | ICD-10-CM | POA: Diagnosis present

## 2013-04-22 DIAGNOSIS — I501 Left ventricular failure: Secondary | ICD-10-CM | POA: Diagnosis present

## 2013-04-22 DIAGNOSIS — R570 Cardiogenic shock: Secondary | ICD-10-CM

## 2013-04-22 DIAGNOSIS — D631 Anemia in chronic kidney disease: Secondary | ICD-10-CM

## 2013-04-22 DIAGNOSIS — E2749 Other adrenocortical insufficiency: Secondary | ICD-10-CM | POA: Diagnosis present

## 2013-04-22 DIAGNOSIS — R1032 Left lower quadrant pain: Secondary | ICD-10-CM | POA: Diagnosis not present

## 2013-04-22 DIAGNOSIS — R6520 Severe sepsis without septic shock: Secondary | ICD-10-CM | POA: Diagnosis not present

## 2013-04-22 DIAGNOSIS — I319 Disease of pericardium, unspecified: Secondary | ICD-10-CM | POA: Diagnosis present

## 2013-04-22 DIAGNOSIS — E162 Hypoglycemia, unspecified: Secondary | ICD-10-CM

## 2013-04-22 DIAGNOSIS — J189 Pneumonia, unspecified organism: Secondary | ICD-10-CM | POA: Diagnosis not present

## 2013-04-22 DIAGNOSIS — I5021 Acute systolic (congestive) heart failure: Secondary | ICD-10-CM | POA: Diagnosis present

## 2013-04-22 DIAGNOSIS — R579 Shock, unspecified: Secondary | ICD-10-CM | POA: Diagnosis not present

## 2013-04-22 DIAGNOSIS — D638 Anemia in other chronic diseases classified elsewhere: Secondary | ICD-10-CM | POA: Diagnosis present

## 2013-04-22 DIAGNOSIS — A0472 Enterocolitis due to Clostridium difficile, not specified as recurrent: Secondary | ICD-10-CM

## 2013-04-22 DIAGNOSIS — K5289 Other specified noninfective gastroenteritis and colitis: Secondary | ICD-10-CM | POA: Diagnosis present

## 2013-04-22 DIAGNOSIS — Z87828 Personal history of other (healed) physical injury and trauma: Secondary | ICD-10-CM

## 2013-04-22 DIAGNOSIS — K7460 Unspecified cirrhosis of liver: Secondary | ICD-10-CM | POA: Diagnosis not present

## 2013-04-22 DIAGNOSIS — R031 Nonspecific low blood-pressure reading: Secondary | ICD-10-CM | POA: Diagnosis not present

## 2013-04-22 DIAGNOSIS — Z7982 Long term (current) use of aspirin: Secondary | ICD-10-CM

## 2013-04-22 DIAGNOSIS — I2589 Other forms of chronic ischemic heart disease: Secondary | ICD-10-CM | POA: Diagnosis present

## 2013-04-22 DIAGNOSIS — N189 Chronic kidney disease, unspecified: Secondary | ICD-10-CM

## 2013-04-22 DIAGNOSIS — I369 Nonrheumatic tricuspid valve disorder, unspecified: Secondary | ICD-10-CM | POA: Diagnosis not present

## 2013-04-22 LAB — CBC WITH DIFFERENTIAL/PLATELET
BASOS ABS: 0 10*3/uL (ref 0.0–0.1)
BASOS PCT: 0 % (ref 0–1)
EOS ABS: 0 10*3/uL (ref 0.0–0.7)
Eosinophils Relative: 0 % (ref 0–5)
HEMATOCRIT: 30 % — AB (ref 39.0–52.0)
HEMOGLOBIN: 10.1 g/dL — AB (ref 13.0–17.0)
LYMPHS PCT: 2 % — AB (ref 12–46)
Lymphs Abs: 0.3 10*3/uL — ABNORMAL LOW (ref 0.7–4.0)
MCH: 31 pg (ref 26.0–34.0)
MCHC: 33.7 g/dL (ref 30.0–36.0)
MCV: 92 fL (ref 78.0–100.0)
MONOS PCT: 2 % — AB (ref 3–12)
Monocytes Absolute: 0.3 10*3/uL (ref 0.1–1.0)
NEUTROS ABS: 13.6 10*3/uL — AB (ref 1.7–7.7)
NEUTROS PCT: 96 % — AB (ref 43–77)
Platelets: 120 10*3/uL — ABNORMAL LOW (ref 150–400)
RBC: 3.26 MIL/uL — ABNORMAL LOW (ref 4.22–5.81)
RDW: 16.4 % — AB (ref 11.5–15.5)
WBC Morphology: INCREASED
WBC: 14.2 10*3/uL — AB (ref 4.0–10.5)

## 2013-04-22 LAB — COMPREHENSIVE METABOLIC PANEL
ALBUMIN: 3.1 g/dL — AB (ref 3.5–5.2)
ALK PHOS: 50 U/L (ref 39–117)
ALT: 88 U/L — AB (ref 0–53)
AST: 89 U/L — ABNORMAL HIGH (ref 0–37)
BILIRUBIN TOTAL: 1.1 mg/dL (ref 0.3–1.2)
BUN: 92 mg/dL — AB (ref 6–23)
CHLORIDE: 92 meq/L — AB (ref 96–112)
CO2: 19 mEq/L (ref 19–32)
Calcium: 9.8 mg/dL (ref 8.4–10.5)
Creatinine, Ser: 12.07 mg/dL — ABNORMAL HIGH (ref 0.50–1.35)
GFR calc Af Amer: 4 mL/min — ABNORMAL LOW (ref >90)
GFR calc non Af Amer: 4 mL/min — ABNORMAL LOW (ref >90)
Glucose, Bld: 94 mg/dL (ref 70–99)
POTASSIUM: 3.7 meq/L (ref 3.7–5.3)
SODIUM: 139 meq/L (ref 137–147)
TOTAL PROTEIN: 6.6 g/dL (ref 6.0–8.3)

## 2013-04-22 LAB — TROPONIN I
Troponin I: <0.3 ng/mL (ref <0.30)
Troponin I: <0.3 ng/mL (ref <0.30)

## 2013-04-22 LAB — URINALYSIS, ROUTINE W REFLEX MICROSCOPIC
GLUCOSE, UA: NEGATIVE mg/dL
KETONES UR: 15 mg/dL — AB
NITRITE: NEGATIVE
PH: 5 (ref 5.0–8.0)
Protein, ur: 100 mg/dL — AB
SPECIFIC GRAVITY, URINE: 1.027 (ref 1.005–1.030)
Urobilinogen, UA: 0.2 mg/dL (ref 0.0–1.0)

## 2013-04-22 LAB — PRO B NATRIURETIC PEPTIDE: Pro B Natriuretic peptide (BNP): 7085 pg/mL — ABNORMAL HIGH (ref 0–125)

## 2013-04-22 LAB — URINE MICROSCOPIC-ADD ON

## 2013-04-22 LAB — LACTIC ACID, PLASMA
LACTIC ACID, VENOUS: 4.5 mmol/L — AB (ref 0.5–2.2)
Lactic Acid, Venous: 5.4 mmol/L — ABNORMAL HIGH (ref 0.5–2.2)

## 2013-04-22 LAB — GLUCOSE, CAPILLARY: GLUCOSE-CAPILLARY: 85 mg/dL (ref 70–99)

## 2013-04-22 LAB — MRSA PCR SCREENING: MRSA by PCR: NEGATIVE

## 2013-04-22 MED ORDER — VANCOMYCIN HCL 10 G IV SOLR
1500.0000 mg | Freq: Once | INTRAVENOUS | Status: AC
  Administered 2013-04-22: 1500 mg via INTRAVENOUS
  Filled 2013-04-22: qty 1500

## 2013-04-22 MED ORDER — PHENYLEPHRINE HCL 10 MG/ML IJ SOLN
30.0000 ug/min | INTRAVENOUS | Status: DC
  Administered 2013-04-22: 90 ug/min via INTRAVENOUS
  Administered 2013-04-23: 200 ug/min via INTRAVENOUS
  Administered 2013-04-23: 60 ug/min via INTRAVENOUS
  Administered 2013-04-23: 200 ug/min via INTRAVENOUS
  Filled 2013-04-22 (×6): qty 4

## 2013-04-22 MED ORDER — PHENYLEPHRINE HCL 10 MG/ML IJ SOLN
30.0000 ug/min | Freq: Once | INTRAMUSCULAR | Status: AC
  Administered 2013-04-22: 50 ug/min via INTRAVENOUS
  Filled 2013-04-22: qty 1

## 2013-04-22 MED ORDER — SODIUM CHLORIDE 0.9 % IV BOLUS (SEPSIS)
500.0000 mL | Freq: Once | INTRAVENOUS | Status: AC
  Administered 2013-04-22: 500 mL via INTRAVENOUS

## 2013-04-22 MED ORDER — NOREPINEPHRINE BITARTRATE 1 MG/ML IJ SOLN
2.0000 ug/min | INTRAVENOUS | Status: DC
  Administered 2013-04-23: 15 ug/min via INTRAVENOUS
  Administered 2013-04-23: 4 ug/min via INTRAVENOUS
  Administered 2013-04-24: 12 ug/min via INTRAVENOUS
  Administered 2013-04-25: 4 ug/min via INTRAVENOUS
  Filled 2013-04-22 (×5): qty 16

## 2013-04-22 MED ORDER — VANCOMYCIN HCL IN DEXTROSE 1-5 GM/200ML-% IV SOLN: 1000.0000 mg | Freq: Once | INTRAVENOUS | Status: DC

## 2013-04-22 MED ORDER — SODIUM CHLORIDE 0.9 % IV SOLN
Freq: Once | INTRAVENOUS | Status: AC
  Administered 2013-04-22: 20:00:00 via INTRAVENOUS

## 2013-04-22 MED ORDER — SODIUM CHLORIDE 0.9 % IV SOLN: 250.0000 mL | INTRAVENOUS | Status: DC | PRN

## 2013-04-22 MED ORDER — IOHEXOL 350 MG/ML SOLN
100.0000 mL | Freq: Once | INTRAVENOUS | Status: AC | PRN
  Administered 2013-04-22: 100 mL via INTRAVENOUS

## 2013-04-22 MED ORDER — ONDANSETRON HCL 4 MG/2ML IJ SOLN
4.0000 mg | Freq: Once | INTRAMUSCULAR | Status: AC
  Administered 2013-04-22: 4 mg via INTRAVENOUS
  Filled 2013-04-22: qty 2

## 2013-04-22 MED ORDER — ACETAMINOPHEN 325 MG PO TABS
650.0000 mg | ORAL_TABLET | Freq: Four times a day (QID) | ORAL | Status: DC | PRN
  Administered 2013-04-22: 650 mg via ORAL
  Filled 2013-04-22: qty 2

## 2013-04-22 NOTE — ED Notes (Signed)
Per Dr. Elsworth Soho neo to be started at 50 mcg.

## 2013-04-22 NOTE — ED Notes (Signed)
250cc bolus started due to BP.

## 2013-04-22 NOTE — ED Notes (Signed)
Patient returned from CT
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
## 2013-04-22 NOTE — ED Notes (Signed)
Port CXR being done now.

## 2013-04-22 NOTE — ED Notes (Signed)
EDP at bedside doing bedside ultrasound.

## 2013-04-22 NOTE — ED Notes (Signed)
250 cc bolus started on pt due to BP.

## 2013-04-22 NOTE — ED Notes (Signed)
Echo complete

## 2013-04-22 NOTE — ED Notes (Signed)
Released pressure at central line site; bleeding appears to be controlled at this time.

## 2013-04-22 NOTE — ED Notes (Signed)
Neo drip running in PIV until central line placement confirmed.

## 2013-04-22 NOTE — ED Notes (Addendum)
Pt c/o generalized body pain and vomiting onset last night. Pt denies change in diet. Pt tearful in triage. Family reports pt had temp of 102.0 last night. Pt took tylenol last night 0110. Symptoms onset while pt was doing his dialysis at home.

## 2013-04-22 NOTE — ED Notes (Signed)
Echo being performed at bedside

## 2013-04-22 NOTE — Progress Notes (Signed)
eLink Physician-Brief Progress Note Patient Name: John HERSHEY Sr. DOB: Dec 02, 1950 MRN: [id number]  Date of Service  04/22/2013   HPI/Events of Note   Echo reported. No effusion. IVC collapsible.  eICU Interventions   1 L bolus. CTA planned. Bedside MD to remove RIJ and place central line.    Intervention Category Major Interventions: Shock - evaluation and management  Mariea Clonts 04/22/2013, 7:57 PM

## 2013-04-22 NOTE — ED Notes (Signed)
Central line noted to be bleeding while obtaining chest x-ray.  EDP notified.  Dressing changed.
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
## 2013-04-22 NOTE — Procedures (Signed)
Central Venous Catheter Insertion Procedure Note [name] [id number] 03-29-1950  Procedure: Insertion of Central Venous Catheter Indications: Drug and/or fluid administration  Procedure Details Consent: Risks of procedure as well as the alternatives and risks of each were explained to the (patient/caregiver).  Consent for procedure obtained. Time Out: Verified patient identification, verified procedure, site/side was marked, verified correct patient position, special equipment/implants available, medications/allergies/relevent history reviewed, required imaging and test results available.  Performed  Maximum sterile technique was used including antiseptics, gloves, gown, hand hygiene, mask and sheet. Skin prep: Chlorhexidine; local anesthetic administered A antimicrobial bonded/coated triple lumen catheter was placed in the right internal jugular vein using the Seldinger technique.  Evaluation Blood flow good Complications: Complications of mild resistance encountered with guidewire, but CVL passed smoothly Patient did tolerate procedure well. Chest X-ray ordered to verify placement.  CXR: pending.  Rigoberto Noel 04/22/2013, 7:12 PM

## 2013-04-22 NOTE — ED Notes (Signed)
Peatient complained of SOB was stating89% so tech put him on 2L now he came up to 100%

## 2013-04-22 NOTE — ED Notes (Signed)
Transporting to 11M at this time.

## 2013-04-22 NOTE — Progress Notes (Signed)
eLink Physician-Brief Progress Note Patient Name: John NAY Sr. DOB: March 09, 1950 MRN: [id number]  Date of Service  04/22/2013   HPI/Events of Note   Persistent Hypotension.  eICU Interventions   RN to increase neo up to max if needed. Resp Therapy to place arterial line.    Intervention Category Major Interventions: Hypotension - evaluation and management  Mariea Clonts 04/22/2013, 10:37 PM
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
## 2013-04-22 NOTE — Progress Notes (Signed)
eLink Physician-Brief Progress Note Patient Name: John RAWL Sr. DOB: 04-Jun-1950 MRN: [id number]  Date of Service  04/22/2013   HPI/Events of Note   Patient remains hypotensive. On 120 neo. Complaining of CP.  eICU Interventions   Stat EKG. Floor MD in code situation not, will place new line ASAP.    Intervention Category Intermediate Interventions: Pain - evaluation and management  Mariea Clonts 04/22/2013, 10:05 PM

## 2013-04-22 NOTE — ED Notes (Signed)
Per Dr. Stark Jock, central line not in correct location.  Second portable chest xray to be ordered.  Pressure placed on central line due to bleeding.

## 2013-04-22 NOTE — ED Notes (Signed)
Assumed care at [facility]; Neo gtt at 48mcg; Pressure 73/43 (MAP 50); Increased neo gtt to 17mcg. Verified IV infusion site as cannot use central line yet; Site is infusing and soft.

## 2013-04-22 NOTE — Progress Notes (Signed)
  Echocardiogram 2D Echocardiogram has been performed.  [name] 04/22/2013, 7:48 PM

## 2013-04-23 ENCOUNTER — Inpatient Hospital Stay (HOSPITAL_COMMUNITY): Payer: Medicare Other

## 2013-04-23 DIAGNOSIS — I214 Non-ST elevation (NSTEMI) myocardial infarction: Secondary | ICD-10-CM

## 2013-04-23 DIAGNOSIS — I428 Other cardiomyopathies: Secondary | ICD-10-CM

## 2013-04-23 DIAGNOSIS — I369 Nonrheumatic tricuspid valve disorder, unspecified: Secondary | ICD-10-CM

## 2013-04-23 DIAGNOSIS — A419 Sepsis, unspecified organism: Secondary | ICD-10-CM

## 2013-04-23 DIAGNOSIS — R6521 Severe sepsis with septic shock: Secondary | ICD-10-CM

## 2013-04-23 DIAGNOSIS — N186 End stage renal disease: Secondary | ICD-10-CM

## 2013-04-23 HISTORY — PX: TRANSTHORACIC ECHOCARDIOGRAM: SHX275

## 2013-04-23 LAB — POCT I-STAT 3, ART BLOOD GAS (G3+)
Acid-base deficit: 8 mmol/L — ABNORMAL HIGH (ref 0.0–2.0)
Bicarbonate: 16.8 mEq/L — ABNORMAL LOW (ref 20.0–24.0)
O2 SAT: 94 %
PO2 ART: 73 mmHg — AB (ref 80.0–100.0)
Patient temperature: 98.7
TCO2: 18 mmol/L (ref 0–100)
pCO2 arterial: 31.3 mmHg — ABNORMAL LOW (ref 35.0–45.0)
pH, Arterial: 7.338 — ABNORMAL LOW (ref 7.350–7.450)

## 2013-04-23 LAB — CBC
HEMATOCRIT: 31.3 % — AB (ref 39.0–52.0)
Hemoglobin: 10.2 g/dL — ABNORMAL LOW (ref 13.0–17.0)
MCH: 30.4 pg (ref 26.0–34.0)
MCHC: 32.6 g/dL (ref 30.0–36.0)
MCV: 93.2 fL (ref 78.0–100.0)
Platelets: 134 10*3/uL — ABNORMAL LOW (ref 150–400)
RBC: 3.36 MIL/uL — ABNORMAL LOW (ref 4.22–5.81)
RDW: 16.9 % — AB (ref 11.5–15.5)
WBC: 37.6 10*3/uL — ABNORMAL HIGH (ref 4.0–10.5)

## 2013-04-23 LAB — BASIC METABOLIC PANEL
BUN: 105 mg/dL — ABNORMAL HIGH (ref 6–23)
CO2: 16 mEq/L — ABNORMAL LOW (ref 19–32)
Calcium: 8.8 mg/dL (ref 8.4–10.5)
Chloride: 94 mEq/L — ABNORMAL LOW (ref 96–112)
Creatinine, Ser: 12.32 mg/dL — ABNORMAL HIGH (ref 0.50–1.35)
GFR, EST AFRICAN AMERICAN: 4 mL/min — AB (ref >90)
GFR, EST NON AFRICAN AMERICAN: 4 mL/min — AB (ref >90)
Glucose, Bld: 127 mg/dL — ABNORMAL HIGH (ref 70–99)
Potassium: 4.3 mEq/L (ref 3.7–5.3)
Sodium: 138 mEq/L (ref 137–147)

## 2013-04-23 LAB — TROPONIN I
TROPONIN I: 7.49 ng/mL — AB (ref <0.30)
Troponin I: <0.3 ng/mL (ref <0.30)
Troponin I: 17.02 ng/mL (ref <0.30)
Troponin I: 17.4 ng/mL (ref <0.30)

## 2013-04-23 LAB — LACTIC ACID, PLASMA
LACTIC ACID, VENOUS: 3.3 mmol/L — AB (ref 0.5–2.2)
Lactic Acid, Venous: 3.5 mmol/L — ABNORMAL HIGH (ref 0.5–2.2)
Lactic Acid, Venous: 3.2 mmol/L — ABNORMAL HIGH (ref 0.5–2.2)

## 2013-04-23 LAB — HEPARIN LEVEL (UNFRACTIONATED): Heparin Unfractionated: <0.1 IU/mL — ABNORMAL LOW (ref 0.30–0.70)

## 2013-04-23 LAB — PROCALCITONIN: Procalcitonin: >175 ng/mL

## 2013-04-23 MED ORDER — IOHEXOL 300 MG/ML  SOLN
100.0000 mL | Freq: Once | INTRAMUSCULAR | Status: AC | PRN
  Administered 2013-04-23: 100 mL via INTRAVENOUS

## 2013-04-23 MED ORDER — VASOPRESSIN 20 UNIT/ML IJ SOLN
0.0300 [IU]/min | INTRAMUSCULAR | Status: DC
  Administered 2013-04-23 – 2013-04-26 (×4): 0.03 [IU]/min via INTRAVENOUS
  Filled 2013-04-23 (×4): qty 2.5

## 2013-04-23 MED ORDER — DEXTROSE 5 % IV SOLN
2.0000 g | Freq: Once | INTRAVENOUS | Status: AC
  Administered 2013-04-23: 2 g via INTRAVENOUS
  Filled 2013-04-23 (×2): qty 2

## 2013-04-23 MED ORDER — LORAZEPAM 2 MG/ML IJ SOLN
2.0000 mg | Freq: Once | INTRAMUSCULAR | Status: AC
  Administered 2013-04-23: 2 mg via INTRAVENOUS

## 2013-04-23 MED ORDER — HEPARIN (PORCINE) IN NACL 100-0.45 UNIT/ML-% IJ SOLN
2000.0000 [IU]/h | INTRAMUSCULAR | Status: DC
  Administered 2013-04-23: 1400 [IU]/h via INTRAVENOUS
  Administered 2013-04-24: 1700 [IU]/h via INTRAVENOUS
  Filled 2013-04-23 (×3): qty 250

## 2013-04-23 MED ORDER — IOHEXOL 300 MG/ML  SOLN
25.0000 mL | INTRAMUSCULAR | Status: AC
  Administered 2013-04-23: 25 mL via ORAL

## 2013-04-23 MED ORDER — LORAZEPAM 2 MG/ML IJ SOLN
INTRAMUSCULAR | Status: AC
  Filled 2013-04-23: qty 1

## 2013-04-23 MED ORDER — FENTANYL CITRATE 0.05 MG/ML IJ SOLN
100.0000 ug | INTRAMUSCULAR | Status: DC | PRN
  Administered 2013-04-23 (×2): 100 ug via INTRAVENOUS
  Filled 2013-04-23 (×3): qty 2

## 2013-04-23 MED ORDER — HEPARIN BOLUS VIA INFUSION
4000.0000 [IU] | Freq: Once | INTRAVENOUS | Status: AC
  Administered 2013-04-23: 4000 [IU] via INTRAVENOUS
  Filled 2013-04-23: qty 4000

## 2013-04-23 MED ORDER — PROMETHAZINE HCL 25 MG/ML IJ SOLN
12.5000 mg | Freq: Three times a day (TID) | INTRAMUSCULAR | Status: DC | PRN
  Administered 2013-04-23 – 2013-04-24 (×3): 12.5 mg via INTRAVENOUS
  Filled 2013-04-23 (×5): qty 1

## 2013-04-23 MED ORDER — HYDROCORTISONE NA SUCCINATE PF 100 MG IJ SOLR
50.0000 mg | Freq: Four times a day (QID) | INTRAMUSCULAR | Status: DC
  Administered 2013-04-23 – 2013-04-27 (×17): 50 mg via INTRAVENOUS
  Filled 2013-04-23 (×22): qty 1

## 2013-04-23 MED ORDER — ONDANSETRON HCL 4 MG/2ML IJ SOLN
4.0000 mg | Freq: Four times a day (QID) | INTRAMUSCULAR | Status: DC | PRN
  Administered 2013-04-23 – 2013-04-26 (×5): 4 mg via INTRAVENOUS
  Filled 2013-04-23 (×5): qty 2

## 2013-04-23 MED ORDER — WHITE PETROLATUM GEL
Status: AC
  Filled 2013-04-23: qty 5

## 2013-04-23 NOTE — Progress Notes (Addendum)
Brief X-Cover Note  Cardiology notified that Mr. Linch troponins are uptrending from 7 to 41 and he continues to have epigastric pain. On arrival; vitals reviewed; BP 125/99  Pulse 105  Temp(Src) 98.7 F (37.1 C) (Oral)  Resp 21  Ht 6' (1.829 m)  Wt 118.1 kg (260 lb 5.8 oz)  BMI 35.30 kg/m2  SpO2 84%on three pressors; he's alert, oriented, tachycardia, regular rhythm, scattered rales, abdomen soft, tender, trace LEE, diaphoretic. Labs reviewed; ECG previous reviewed; repeat obtained, HR 80s, nl axis, lateral ST depression possible but significant baseline wander 2/2 breathing. Mr. Sparling has had more epigastric discomfort in the last few hours with ongoing fever/diaphoresis. Differential is abdominal vs. Type II NSTEMI (demand ischemia) vs. NSTEMI. Challenging to determine as I would suspect significant ECG changes to cause hypotension requiring multiple pressors and elevated troponins. His fever is quite high on Friday (103) in the last 24 hours and he didn't tolerate dialysis Friday evening. Will review ECG with interventional Cardiology and will await results of CT scan (stat going now) to help determine if risk/benefits favor urgent cardiac catheterization although Mr. Yardley likely has underlying CAD, I don't feel strongly that plaque rupture is the etiology of his hypotension given lack of significant ECG findings.  Jules Husbands, MD

## 2013-04-23 NOTE — Procedures (Signed)
Central Venous Catheter Insertion Procedure Note Jaivion Kingsley [id number] 24-Jun-1950  Procedure: Insertion of Central Venous Catheter Indications: Assessment of intravascular volume, Drug and/or fluid administration and Frequent blood sampling  Procedure Details Consent: Risks of procedure as well as the alternatives and risks of each were explained to the (patient/caregiver).  Consent for procedure obtained. Time Out: Verified patient identification, verified procedure, site/side was marked, verified correct patient position, special equipment/implants available, medications/allergies/relevent history reviewed, required imaging and test results available.  Performed  Maximum sterile technique was used including antiseptics, cap, gloves, gown, hand hygiene, mask and sheet. Skin prep: Chlorhexidine; local anesthetic administered A antimicrobial bonded/coated triple lumen catheter was placed in the left internal jugular vein using the Seldinger technique and ultrasound guidance.  Evaluation Blood flow good Complications: No apparent complications Patient did tolerate procedure well. Chest X-ray ordered to verify placement.  CXR: pending.  Guy Begin 04/23/2013, 12:38 AM
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
## 2013-04-23 NOTE — Progress Notes (Signed)
  Echocardiogram 2D Echocardiogram (limited) has been performed.  Basilia Jumbo 04/23/2013, 3:26 PM
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
## 2013-04-23 NOTE — Progress Notes (Signed)
PULMONARY / CRITICAL CARE MEDICINE   Name: [name] MRN: [id number] DOB: 03/27/1950    ADMISSION DATE:  04/22/2013 CONSULTATION DATE:  04/22/2013  REFERRING MD :  EDP PRIMARY SERVICE: PCCM  CHIEF COMPLAINT:  chest pain, low BP.  BRIEF PATIENT DESCRIPTION:  63 y.o. ESRD on HD via a right arm AV fistula, presents with CP, shock and cardiomegaly on chest x-ray Echo - no effusion, CTA -no dissection, EF noted to be 35% with pos trops c/w NSTEMI   SIGNIFICANT EVENTS / STUDIES:  Admit 4/11>> with chest pain, hypotension Echo 4/11>> Stat echo performed by tech - per prelim at bedside showed moderately decreased LVEF 35-40% diffuse HK, no significant regurg, TR velocity 2.4 m/sec and IVC size of 1.4cm with collapse and no pericardial effusion. EKG: NSR  CT angiogram 4/11 >>interstitial fibrosis. There regions of  scarring versus atelectasis within the lung bases.  4/12>>elevated troponin; cardiology to recheck troponin and repeat echo; started heparin    LINES / TUBES: RIJ 4/11 >>  CULTURES: BC 4/11>>  ANTIBIOTICS: Vanc 4/11>>4/11  SUBJECTIVE: CP improved C/o nausea with vomiting -bilious No dyspnea   VITAL SIGNS: Temp:  [97.7 F (36.5 C)-98.7 F (37.1 C)] 98.7 F (37.1 C) (04/12 0839) Pulse Rate:  [65-215] 90 (04/12 1000) Resp:  [0-31] 31 (04/12 1000) BP: (56-102)/(23-75) 102/63 mmHg (04/12 1000) SpO2:  [83 %-100 %] 95 % (04/12 1000) Arterial Line BP: (78-138)/(40-60) 118/52 mmHg (04/12 1000) FiO2 (%):  [4 %] 4 % (04/12 0800) Weight:  [250 lb (113.399 kg)-260 lb 5.8 oz (118.1 kg)] 260 lb 5.8 oz (118.1 kg) (04/12 0454)  HEMODYNAMICS:   VENTILATOR SETTINGS: Vent Mode:  [-]  FiO2 (%):  [4 %] 4 % INTAKE / OUTPUT: Intake/Output     04/11 0701 - 04/12 0700 04/12 0701 - 04/13 0700   I.V. (mL/kg) 886.5 (7.5) 129.9 (1.1)   IV Piggyback 500    Total Intake(mL/kg) 1386.5 (11.7) 129.9 (1.1)   Urine (mL/kg/hr) 75 2 (0)   Total Output 75 2   Net +1311.5 +127.9           PHYSICAL EXAMINATION: General:  Mod distress Neuro: no focal deficits HEENT:  NCAT, EOMI Cardiovascular:  RRR, no M/R/G  Lungs:  CTAB Abdomen:  +BS, soft, NT/ND Ext:  warm, dry; no edema  LABS: PULMONARY No results found for this basename: PHART, PCO2, PCO2ART, PO2, PO2ART, HCO3, TCO2, O2SAT,  in the last 168 hours  CBC  Recent Labs Lab 04/22/13 1540 04/23/13 0413  HGB 10.1* 10.2*  HCT 30.0* 31.3*  WBC 14.2* 37.6*  PLT 120* 134*    COAGULATION No results found for this basename: INR,  in the last 168 hours  CARDIAC    Recent Labs Lab 04/22/13 1527 04/22/13 2300 04/23/13 0245 04/23/13 0944  TROPONINI <0.30 <0.30 <0.30 7.49*    Recent Labs Lab 04/22/13 1527  PROBNP 7085.0*     CHEMISTRY  Recent Labs Lab 04/22/13 1540 04/23/13 0413  NA 139 138  K 3.7 4.3  CL 92* 94*  CO2 19 16*  GLUCOSE 94 127*  BUN 92* 105*  CREATININE 12.07* 12.32*  CALCIUM 9.8 8.8   Estimated Creatinine Clearance: 8.1 ml/min (by C-G formula based on Cr of 12.32).   LIVER  Recent Labs Lab 04/22/13 1540  AST 89*  ALT 88*  ALKPHOS 50  BILITOT 1.1  PROT 6.6  ALBUMIN 3.1*     INFECTIOUS  Recent Labs Lab 04/22/13 2300 04/23/13 0413 04/23/13 0800 04/23/13  0900  LATICACIDVEN 5.4* 3.5* 3.2*  --   PROCALCITON  --   --   --  >175.00     ENDOCRINE CBG (last 3)   Recent Labs  04/22/13 2129  GLUCAP 85     IMAGING x48h  Dg Chest Port 1 View  04/23/2013   CLINICAL DATA:  Pulmonary edema  EXAM: PORTABLE CHEST - 1 VIEW  COMPARISON:  DG CHEST 1V PORT dated 04/23/2013; DG CHEST 1V PORT dated 04/22/2013; CT ANGIO CHEST AORTA W/CM & WO/CM dated 04/22/2013; DG CHEST 1V PORT dated 04/22/2013; DG CHEST 2 VIEW dated 12/23/2010  FINDINGS: Grossly unchanged enlarged cardiac silhouette and mediastinal contours. Stable positioning of support apparatus. The pulmonary vasculature remains indistinct with cephalization of flow. There is chronic blunting of the left  costophrenic angle without definite pleural effusion. No pneumothorax. A vascular stent overlies right lung apex. Unchanged bones.  IMPRESSION: Grossly unchanged findings of cardiomegaly and mild pulmonary edema.   Electronically Signed   By: Sandi Mariscal M.D.   On: 04/23/2013 08:08   Dg Chest Port 1 View  04/23/2013   CLINICAL DATA:  Central line placement.  EXAM: PORTABLE CHEST - 1 VIEW  COMPARISON:  04/22/2013  FINDINGS: Interval placement of a left central venous catheter. The tip is localized over the mid SVC region and directed somewhat laterally, likely at or near the origin of the brachiocephalic vein. No pneumothorax. Cardiac enlargement with small left pleural effusion. Pulmonary vascularity is normal. Vascular stent projected over the right subclavian region.  IMPRESSION: Left central venous catheter tip localizes over the mid SVC, directed somewhat laterally. Persistent cardiac enlargement and small pleural effusions.   Electronically Signed   By: Lucienne Capers M.D.   On: 04/23/2013 00:51   Dg Chest Port 1 View  04/22/2013   CLINICAL DATA:  post line placement post line placement  EXAM: PORTABLE CHEST - 1 VIEW  COMPARISON:  None.  FINDINGS: Low lung volumes. The cardiac silhouette is enlarged. Central venous catheter tip appreciated in the region of distal right internal jugular vein. Repositioning and advancement is recommended. There is no evidence of pneumothorax. The lungs are clear. The osseous structures demonstrate no acute abnormalities. Mild degenerative changes of the right shoulder.  IMPRESSION: Repositioning of the patient's right internal jugular catheter is recommended. These findings were discussed with Dr. Stark Jock of the emergency department who has informed the patient's critical care attending of these findings.  No acute cardiopulmonary disease.   Electronically Signed   By: Margaree Mackintosh M.D.   On: 04/22/2013 20:16   Dg Chest Portable 1 View  04/22/2013   CLINICAL DATA:   Generalized body aches and emesis  EXAM: PORTABLE CHEST - 1 VIEW  COMPARISON:  Chest radiograph 04/22/2013 and 12/23/2010  FINDINGS: Right subclavian vascular stent noted. Numerous cardiac leads project over the chest. Cardiopericardial silhouette appears enlarged, similar compared to today's earlier chest radiograph. Please note the heart size has increased since the prior study of 2012, for which pericardial effusion cannot be excluded. There is pulmonary vascular congestion. No definite pulmonary edema. Negative for consolidation.  IMPRESSION: No significant change compared to the radiograph performed earlier today. Assistant cardiomegaly and mild prominence of the pulmonary vascularity.   Electronically Signed   By: Curlene Dolphin M.D.   On: 04/22/2013 19:31   Dg Chest Port 1 View  04/22/2013   CLINICAL DATA:  fever, weakness, hypotension  EXAM: PORTABLE CHEST - 1 VIEW  COMPARISON:  DG CHEST 2 VIEW dated 12/23/2010  FINDINGS: The film was taken in a lordotic projection. The lung volumes are mildly decreased. The cardiopericardial silhouette is markedly enlarged as compared to the previous study. There is no definite pleural effusion but basilar atelectasis is suspected especially on the left. The pulmonary vascularity is minimally prominent centrally.  IMPRESSION: There is bilateral pulmonary hyperinflation with new enlargement of the cardiac silhouette. The findings may reflect congestive heart failure. A pericardial effusion is not excluded.   Electronically Signed   By: David  Martinique   On: 04/22/2013 17:07   Ct Angio Chest Aorta W/cm &/or Wo/cm  04/22/2013   CLINICAL DATA:  Rule out dissection  EXAM: CT ANGIOGRAPHY CHEST WITH CONTRAST  TECHNIQUE: Multidetector CT imaging of the chest was performed using the standard protocol during bolus administration of intravenous contrast. Multiplanar CT image reconstructions and MIPs were obtained to evaluate the vascular anatomy.  CONTRAST:  130mL OMNIPAQUE IOHEXOL  350 MG/ML SOLN  COMPARISON:  DG CHEST 1V PORT dated 04/22/2013  FINDINGS: A 3 cm left thyroid nodule is appreciated. Further evaluation with nonemergent thyroid ultrasound recommended. A right subclavian vein stent is appreciated. The thoracic inlet is otherwise unremarkable.  No mediastinal or hilar masses or adenopathy is appreciated. Atherosclerotic calcifications identified within the coronary arteries. The heart is enlarged.  There is no evidence of a thoracic aortic aneurysm nor dissection. There are no filling defects within the main, lobar, or segmental pulmonary arteries.  There areas of diffuse interlobular and subpleural septal thickening in the posterior upper lobes and lung bases. There also linear densities within the lung bases. More focal confluent areas of increased density are appreciated within the posterior dependent portions of the lung bases.  Partial visualization of the liver demonstrates subcapsular oval-shaped areas of low attenuation along the right lobe of the liver. Remaining visualized upper abdominal viscera are unremarkable.  No aggressive appearing osseous lesions are appreciated.  Review of the MIP images confirms the above findings.  IMPRESSION: 1. No CT evidence of a thoracic aortic aneurysm nor dissection. There is no evidence of pulmonary arterial embolic disease 2. Findings consistent with interstitial fibrosis. There regions of scarring versus atelectasis within the lung bases. Mild infiltrates within the posterior dependent portions of the lungs left greater than right cannot be excluded. 3. Indeterminate subcapsular low attenuating areas along the right lobe of the liver. The patient has a history of prior intra-abdominal surgery and these findings may represent postoperative changes. Clinical correlation recommended. 4. Left thyroid nodules further evaluation with nonemergent thyroid ultrasound recommended.   Electronically Signed   By: Margaree Mackintosh M.D.   On: 04/22/2013  21:16     PULMONARY No results found for this basename: PHART, PCO2, PCO2ART, PO2, PO2ART, HCO3, TCO2, O2SAT,  in the last 168 hours  CBC  Recent Labs Lab 04/22/13 1540 04/23/13 0413  HGB 10.1* 10.2*  HCT 30.0* 31.3*  WBC 14.2* 37.6*  PLT 120* 134*    COAGULATION No results found for this basename: INR,  in the last 168 hours  CARDIAC    Recent Labs Lab 04/22/13 1527 04/22/13 2300 04/23/13 0245 04/23/13 0944  TROPONINI <0.30 <0.30 <0.30 7.49*    Recent Labs Lab 04/22/13 1527  PROBNP 7085.0*     CHEMISTRY  Recent Labs Lab 04/22/13 1540 04/23/13 0413  NA 139 138  K 3.7 4.3  CL 92* 94*  CO2 19 16*  GLUCOSE 94 127*  BUN 92* 105*  CREATININE 12.07* 12.32*  CALCIUM 9.8 8.8   Estimated Creatinine Clearance:  8.1 ml/min (by C-G formula based on Cr of 12.32).   LIVER  Recent Labs Lab 04/22/13 1540  AST 89*  ALT 88*  ALKPHOS 50  BILITOT 1.1  PROT 6.6  ALBUMIN 3.1*     INFECTIOUS  Recent Labs Lab 04/22/13 2300 04/23/13 0413 04/23/13 0800 04/23/13 0900  LATICACIDVEN 5.4* 3.5* 3.2*  --   PROCALCITON  --   --   --  >175.00     ENDOCRINE CBG (last 3)   Recent Labs  04/22/13 2129  GLUCAP 85     IMAGING x48h  Dg Chest Port 1 View  04/23/2013   CLINICAL DATA:  Pulmonary edema  EXAM: PORTABLE CHEST - 1 VIEW  COMPARISON:  DG CHEST 1V PORT dated 04/23/2013; DG CHEST 1V PORT dated 04/22/2013; CT ANGIO CHEST AORTA W/CM & WO/CM dated 04/22/2013; DG CHEST 1V PORT dated 04/22/2013; DG CHEST 2 VIEW dated 12/23/2010  FINDINGS: Grossly unchanged enlarged cardiac silhouette and mediastinal contours. Stable positioning of support apparatus. The pulmonary vasculature remains indistinct with cephalization of flow. There is chronic blunting of the left costophrenic angle without definite pleural effusion. No pneumothorax. A vascular stent overlies right lung apex. Unchanged bones.  IMPRESSION: Grossly unchanged findings of cardiomegaly and mild pulmonary  edema.   Electronically Signed   By: Sandi Mariscal M.D.   On: 04/23/2013 08:08   Dg Chest Port 1 View  04/23/2013   CLINICAL DATA:  Central line placement.  EXAM: PORTABLE CHEST - 1 VIEW  COMPARISON:  04/22/2013  FINDINGS: Interval placement of a left central venous catheter. The tip is localized over the mid SVC region and directed somewhat laterally, likely at or near the origin of the brachiocephalic vein. No pneumothorax. Cardiac enlargement with small left pleural effusion. Pulmonary vascularity is normal. Vascular stent projected over the right subclavian region.  IMPRESSION: Left central venous catheter tip localizes over the mid SVC, directed somewhat laterally. Persistent cardiac enlargement and small pleural effusions.   Electronically Signed   By: Lucienne Capers M.D.   On: 04/23/2013 00:51   Dg Chest Port 1 View  04/22/2013   CLINICAL DATA:  post line placement post line placement  EXAM: PORTABLE CHEST - 1 VIEW  COMPARISON:  None.  FINDINGS: Low lung volumes. The cardiac silhouette is enlarged. Central venous catheter tip appreciated in the region of distal right internal jugular vein. Repositioning and advancement is recommended. There is no evidence of pneumothorax. The lungs are clear. The osseous structures demonstrate no acute abnormalities. Mild degenerative changes of the right shoulder.  IMPRESSION: Repositioning of the patient's right internal jugular catheter is recommended. These findings were discussed with Dr. Stark Jock of the emergency department who has informed the patient's critical care attending of these findings.  No acute cardiopulmonary disease.   Electronically Signed   By: Margaree Mackintosh M.D.   On: 04/22/2013 20:16   Dg Chest Portable 1 View  04/22/2013   CLINICAL DATA:  Generalized body aches and emesis  EXAM: PORTABLE CHEST - 1 VIEW  COMPARISON:  Chest radiograph 04/22/2013 and 12/23/2010  FINDINGS: Right subclavian vascular stent noted. Numerous cardiac leads project over  the chest. Cardiopericardial silhouette appears enlarged, similar compared to today's earlier chest radiograph. Please note the heart size has increased since the prior study of 2012, for which pericardial effusion cannot be excluded. There is pulmonary vascular congestion. No definite pulmonary edema. Negative for consolidation.  IMPRESSION: No significant change compared to the radiograph performed earlier today. Assistant cardiomegaly and mild prominence  of the pulmonary vascularity.   Electronically Signed   By: Curlene Dolphin M.D.   On: 04/22/2013 19:31   Dg Chest Port 1 View  04/22/2013   CLINICAL DATA:  fever, weakness, hypotension  EXAM: PORTABLE CHEST - 1 VIEW  COMPARISON:  DG CHEST 2 VIEW dated 12/23/2010  FINDINGS: The film was taken in a lordotic projection. The lung volumes are mildly decreased. The cardiopericardial silhouette is markedly enlarged as compared to the previous study. There is no definite pleural effusion but basilar atelectasis is suspected especially on the left. The pulmonary vascularity is minimally prominent centrally.  IMPRESSION: There is bilateral pulmonary hyperinflation with new enlargement of the cardiac silhouette. The findings may reflect congestive heart failure. A pericardial effusion is not excluded.   Electronically Signed   By: David  Martinique   On: 04/22/2013 17:07   Ct Angio Chest Aorta W/cm &/or Wo/cm  04/22/2013   CLINICAL DATA:  Rule out dissection  EXAM: CT ANGIOGRAPHY CHEST WITH CONTRAST  TECHNIQUE: Multidetector CT imaging of the chest was performed using the standard protocol during bolus administration of intravenous contrast. Multiplanar CT image reconstructions and MIPs were obtained to evaluate the vascular anatomy.  CONTRAST:  128mL OMNIPAQUE IOHEXOL 350 MG/ML SOLN  COMPARISON:  DG CHEST 1V PORT dated 04/22/2013  FINDINGS: A 3 cm left thyroid nodule is appreciated. Further evaluation with nonemergent thyroid ultrasound recommended. A right subclavian vein  stent is appreciated. The thoracic inlet is otherwise unremarkable.  No mediastinal or hilar masses or adenopathy is appreciated. Atherosclerotic calcifications identified within the coronary arteries. The heart is enlarged.  There is no evidence of a thoracic aortic aneurysm nor dissection. There are no filling defects within the main, lobar, or segmental pulmonary arteries.  There areas of diffuse interlobular and subpleural septal thickening in the posterior upper lobes and lung bases. There also linear densities within the lung bases. More focal confluent areas of increased density are appreciated within the posterior dependent portions of the lung bases.  Partial visualization of the liver demonstrates subcapsular oval-shaped areas of low attenuation along the right lobe of the liver. Remaining visualized upper abdominal viscera are unremarkable.  No aggressive appearing osseous lesions are appreciated.  Review of the MIP images confirms the above findings.  IMPRESSION: 1. No CT evidence of a thoracic aortic aneurysm nor dissection. There is no evidence of pulmonary arterial embolic disease 2. Findings consistent with interstitial fibrosis. There regions of scarring versus atelectasis within the lung bases. Mild infiltrates within the posterior dependent portions of the lungs left greater than right cannot be excluded. 3. Indeterminate subcapsular low attenuating areas along the right lobe of the liver. The patient has a history of prior intra-abdominal surgery and these findings may represent postoperative changes. Clinical correlation recommended. 4. Left thyroid nodules further evaluation with nonemergent thyroid ultrasound recommended.   Electronically Signed   By: Margaree Mackintosh M.D.   On: 04/22/2013 21:16     ASSESSMENT / PLAN:  PULMONARY  A: No issues  P:  O2 as needed   CARDIOVASCULAR  A:  Shock, septic  ?Pericardial effusion/tamponade - stat echo performed by tech - per my prelim at  bedside showed moderately decreased LVf function EF 35-40% diffuse HK, no significant regurg, TR velocity 2.4 m/sec and IVC size of 1.4cm with collapse and no pericardial effusion.  Elevated troponin 7.9 P:  Cardiology to recheck troponin and begin heparin; repeat echo ordered Defer need for intervention to them  vaso added to  levophed  with goal MAP>65 Taper neo to off    RENAL  A:  esrd  AG metabolic acidosis - lactic acid trending down P:  renal following  GASTROINTESTINAL  A: No issues  P:  NPO for now   HEMATOLOGIC  A: Leukocytosis - ?infectious , jump in WC from 16 to 37k P:  Monitor   INFECTIOUS  A: Occult sepsis  P:  ct vanc Use pct algorithm   ENDOCRINE  A: No issues  P:   NEUROLOGIC  A: No issues  P:   Code status: Full    Michail Jewels, MD Internal Medicine Teaching Service, PGY-1  Summary - Appears to be in cardiogenic shock with NSTEMI & perhaps sepsis, although prelim cultures neg  Care during the described time interval was provided by me and/or other providers on the critical care team.  I have reviewed this patient's available data, including medical history, events of note, physical examination and test results as part of my evaluation  CC time x 66m  Kara Mead MD. Shade Flood. [facility] Pager [phone] If no response call [phone]

## 2013-04-23 NOTE — Progress Notes (Signed)
[facility] Progress Note Patient Name: John ROHRBACH Sr. DOB: 1950/07/21 MRN: [id number]  Date of Service  04/23/2013   HPI/Events of Note   Shock Neo@200    eICU Interventions    Levophed added    Intervention Category Major Interventions: Shock - evaluation and management  Laterrica Libman 04/23/2013, 12:02 AM
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
## 2013-04-23 NOTE — Progress Notes (Signed)
UR Completed.  Vergie Living [id number] 04/23/2013

## 2013-04-23 NOTE — Procedures (Signed)
Arterial Catheter Insertion Procedure Note Richie R Bohlman Sr. [id number] 10/02/1950  Procedure: Insertion of Arterial Catheter  Indications: Blood pressure monitoring  Procedure Details Consent: Risks of procedure as well as the alternatives and risks of each were explained to the (patient/caregiver).  Consent for procedure obtained. Time Out: Verified patient identification, verified procedure, site/side was marked, verified correct patient position, special equipment/implants available, medications/allergies/relevent history reviewed, required imaging and test results available.  Performed  Maximum sterile technique was used including antiseptics, cap, gloves, gown, hand hygiene, mask and sheet. Skin prep: Chlorhexidine; local anesthetic administered 20 gauge catheter was inserted into left radial artery using the Seldinger technique.  Evaluation Blood flow good; BP tracing good. Complications: No apparent complications.  RT placed arterial catheter into pt. Left radial artery per MD order and per RT protocol. Sterile procedures were met, no apparent complications.   William Henry Brown 04/23/2013  

## 2013-04-23 NOTE — Progress Notes (Signed)
Tn comes back  7.5 with previous at 0245 normal  There is some discordance between story and labs, in that abd/epigstric pain and nausea started >24 hrs ago;  ECG shows perhaps trivial STE 1.L and ST depression in III  Have discussed with renal and CCM  Will begin heparin and given shock and pressor dependence will discuss with interventional about emergent cath with +Tn and ongoing discomfort   Comorbidities make intervention in absence of ST elevation likely not fruitful  Will plan, following discussion with DR HS to recheck tropoinin and echo and begin heparin and reassess in few hours

## 2013-04-23 NOTE — Progress Notes (Signed)
PULMONARY / CRITICAL CARE MEDICINE  Name: John R Hulgan Sr. MRN: [id number] DOB: Jul 24, 1950    ADMISSION DATE:  04/22/2013 CONSULTATION DATE:  04/22/2013  REFERRING MD :  EDP PRIMARY SERVICE: PCCM  CHIEF COMPLAINT:  Chest pain  BRIEF PATIENT DESCRIPTION:  63 yo ESRD on HD who presented on 4/11 with chest pain and hypotension.  SIGNIFICANT EVENTS / STUDIES:  4/11  Chest CTA >>> NO dissection, NO embolus, IPF, thyroid nodules 4/11  TTE >>> PRELIMINARY EF 35%, NO pericardial effusion >>> 4/12  CT abdomen >>> Bibasilar airspace disease, possible colitis without perforation, possible duodenitis / pancreatitis 4/13  CVVH started  LINES / TUBES: R IJ TLC 4/11 >>> L R AL ??? >>>  CULTURES: 4/11 MRSA PCR >>> neg 4/11 Blood >>>  ANTIBIOTICS: Vancomycin 4/11>>> Ceftazidime 4/12 >>> Flagyl 4/13 >>>  INTERVAL HISTORY: Liquid stools.  Off Neo-Synephrine.  VITAL SIGNS: Temp:  [98 F (36.7 C)-99.1 F (37.3 C)] 98.4 F (36.9 C) (04/13 0405) Pulse Rate:  [28-215] 78 (04/13 0630) Resp:  [9-35] 21 (04/13 0630) BP: (61-152)/(33-129) 65/45 mmHg (04/13 0630) SpO2:  [64 %-100 %] 95 % (04/13 0630) Arterial Line BP: (72-146)/(39-74) 85/45 mmHg (04/13 0630) Weight:  [120.2 kg (264 lb 15.9 oz)] 120.2 kg (264 lb 15.9 oz) (04/13 0405)  HEMODYNAMICS:   VENTILATOR SETTINGS:   INTAKE / OUTPUT: Intake/Output     04/12 0701 - 04/13 0700 04/13 0701 - 04/14 0700   I.V. (mL/kg) 1328.8 (11.1)    IV Piggyback 50    Total Intake(mL/kg) 1378.8 (11.5)    Urine (mL/kg/hr) 402 (0.1)    Total Output 402     Net +976.8          Urine Occurrence 1 x    Stool Occurrence 6 x     PHYSICAL EXAMINATION: General:  Appears comfortable Neuro:  Awake, alert HEENT:  PERRL Cardiovascular:  RRR, no m/r/g Lungs:  Bilateral diminished air entry, few rales Abdomen:  Soft, nontender, bowel sounds diminished Musculoskeletal:  Moves all extremities, no edema Skin:  Intact, AV fistula R UE  LABS: CBC  Recent  Labs Lab 04/22/13 1540 04/23/13 0413 04/24/13 0500  WBC 14.2* 37.6* 38.7*  HGB 10.1* 10.2* 9.4*  HCT 30.0* 31.3* 28.2*  PLT 120* 134* 109*   Coag's No results found for this basename: APTT, INR,  in the last 168 hours  BMET  Recent Labs Lab 04/22/13 1540 04/23/13 0413  NA 139 138  K 3.7 4.3  CL 92* 94*  CO2 19 16*  BUN 92* 105*  CREATININE 12.07* 12.32*  GLUCOSE 94 127*   Electrolytes  Recent Labs Lab 04/22/13 1540 04/23/13 0413  CALCIUM 9.8 8.8   Sepsis Markers  Recent Labs Lab 04/23/13 0413 04/23/13 0800 04/23/13 0900 04/23/13 1400 04/24/13 0500  LATICACIDVEN 3.5* 3.2*  --  3.3*  --   PROCALCITON  --   --  >175.00  --  >175.00   ABG  Recent Labs Lab 04/23/13 2021  PHART 7.338*  PCO2ART 31.3*  PO2ART 73.0*    Liver Enzymes  Recent Labs Lab 04/22/13 1540  AST 89*  ALT 88*  ALKPHOS 50  BILITOT 1.1  ALBUMIN 3.1*   Cardiac Enzymes  Recent Labs Lab 04/22/13 1527  04/23/13 1400 04/23/13 2019 04/24/13 0200  TROPONINI <0.30  < > 17.02* 17.40* 11.84*  PROBNP 7085.0*  --   --   --   --   < > = values in this interval not displayed. Glucose  Recent  Labs Lab 04/22/13 2129  GLUCAP 22   IMAGING:  Ct Abdomen Pelvis W Contrast  04/23/2013   CLINICAL DATA:  Worsening hypovolemic shock and history of end-stage renal disease.  EXAM: CT ABDOMEN AND PELVIS WITH CONTRAST  TECHNIQUE: Multidetector CT imaging of the abdomen and pelvis was performed using the standard protocol following bolus administration of intravenous contrast.  CONTRAST:  166mL OMNIPAQUE IOHEXOL 300 MG/ML  SOLN  COMPARISON:  DG CHEST 1V PORT dated 04/23/2013; DG CHEST 1V PORT dated 04/23/2013  FINDINGS: The visualized lung bases show dense consolidation of both lower lobes, right greater than left. There is a trace amount of right pleural fluid. Air bronchograms are present in areas of consolidation and findings are consistent with pneumonia and potentially aspiration.  The liver  demonstrates cirrhotic changes without mass or biliary dilatation. There is a small rim of fluid anterior to the liver. The spleen, pancreas, adrenal glands and kidneys are unremarkable.  The colon is decompressed but demonstrates evidence of probable wall thickening and he see inflammatory change, especially at the level of the ascending colon and proximal transverse colon. Findings are suggestive of colitis. There is no evidence of free intraperitoneal air or focal abscess. Some mild edema and stranding is noted trauma to the retroperitoneum. There also is some thickening and inflammatory change involving the transverse duodenum that abuts the head of the pancreas. Correlation suggested with any possibility of pancreatitis. No peripancreatic fluid collections are identified.  No vascular abnormalities seen. No focal masses or enlarged lymph nodes are seen. The bladder is decompressed. Bony structures are unremarkable.  IMPRESSION: 1. Pulmonary consolidation with air bronchograms in both lower lobes, right greater than left. Findings are consistent with pneumonia and potentially aspiration. 2. Decompressed but thickened appearance of the colon may be consistent with colitis. There is no evidence of bowel perforation. 3. Diffuse inflammatory stranding throughout the retroperitoneum with some thickening inflammatory change involving the transverse duodenum that abuts the head of the pancreas. This may represent changes related to peptic ulcer disease/duodenitis or pancreatitis. 4. Cirrhotic changes of the liver.   Electronically Signed   By: Aletta Edouard M.D.   On: 04/23/2013 22:38   Dg Chest Port 1 View  04/24/2013   CLINICAL DATA:  Hypoxia  EXAM: PORTABLE CHEST - 1 VIEW  COMPARISON:  DG CHEST 1V PORT dated 04/23/2013  FINDINGS: There is increased bilateral interstitial thickening and prominence of the central pulmonary vasculature. There are bilateral small pleural effusions. There is no pneumothorax. Stable  cardiomegaly. Left jugular central venous catheter in unchanged position. The osseous structures are unremarkable.  IMPRESSION: Overall findings are concerning for worsening pulmonary edema.   Electronically Signed   By: Kathreen Devoid   On: 04/24/2013 04:09   Dg Chest Port 1 View  04/23/2013   CLINICAL DATA:  Pulmonary edema  EXAM: PORTABLE CHEST - 1 VIEW  COMPARISON:  DG CHEST 1V PORT dated 04/23/2013; DG CHEST 1V PORT dated 04/22/2013; CT ANGIO CHEST AORTA W/CM & WO/CM dated 04/22/2013; DG CHEST 1V PORT dated 04/22/2013; DG CHEST 2 VIEW dated 12/23/2010  FINDINGS: Grossly unchanged enlarged cardiac silhouette and mediastinal contours. Stable positioning of support apparatus. The pulmonary vasculature remains indistinct with cephalization of flow. There is chronic blunting of the left costophrenic angle without definite pleural effusion. No pneumothorax. A vascular stent overlies right lung apex. Unchanged bones.  IMPRESSION: Grossly unchanged findings of cardiomegaly and mild pulmonary edema.   Electronically Signed   By: Eldridge Abrahams.D.  On: 04/23/2013 08:08   Dg Chest Port 1 View  04/23/2013   CLINICAL DATA:  Central line placement.  EXAM: PORTABLE CHEST - 1 VIEW  COMPARISON:  04/22/2013  FINDINGS: Interval placement of a left central venous catheter. The tip is localized over the mid SVC region and directed somewhat laterally, likely at or near the origin of the brachiocephalic vein. No pneumothorax. Cardiac enlargement with small left pleural effusion. Pulmonary vascularity is normal. Vascular stent projected over the right subclavian region.  IMPRESSION: Left central venous catheter tip localizes over the mid SVC, directed somewhat laterally. Persistent cardiac enlargement and small pleural effusions.   Electronically Signed   By: Lucienne Capers M.D.   On: 04/23/2013 00:51   Dg Chest Port 1 View  04/22/2013   CLINICAL DATA:  post line placement post line placement  EXAM: PORTABLE CHEST - 1 VIEW   COMPARISON:  None.  FINDINGS: Low lung volumes. The cardiac silhouette is enlarged. Central venous catheter tip appreciated in the region of distal right internal jugular vein. Repositioning and advancement is recommended. There is no evidence of pneumothorax. The lungs are clear. The osseous structures demonstrate no acute abnormalities. Mild degenerative changes of the right shoulder.  IMPRESSION: Repositioning of the patient's right internal jugular catheter is recommended. These findings were discussed with Dr. Stark Jock of the emergency department who has informed the patient's critical care attending of these findings.  No acute cardiopulmonary disease.   Electronically Signed   By: Margaree Mackintosh M.D.   On: 04/22/2013 20:16   Dg Chest Portable 1 View  04/22/2013   CLINICAL DATA:  Generalized body aches and emesis  EXAM: PORTABLE CHEST - 1 VIEW  COMPARISON:  Chest radiograph 04/22/2013 and 12/23/2010  FINDINGS: Right subclavian vascular stent noted. Numerous cardiac leads project over the chest. Cardiopericardial silhouette appears enlarged, similar compared to today's earlier chest radiograph. Please note the heart size has increased since the prior study of 2012, for which pericardial effusion cannot be excluded. There is pulmonary vascular congestion. No definite pulmonary edema. Negative for consolidation.  IMPRESSION: No significant change compared to the radiograph performed earlier today. Assistant cardiomegaly and mild prominence of the pulmonary vascularity.   Electronically Signed   By: Curlene Dolphin M.D.   On: 04/22/2013 19:31   Dg Chest Port 1 View  04/22/2013   CLINICAL DATA:  fever, weakness, hypotension  EXAM: PORTABLE CHEST - 1 VIEW  COMPARISON:  DG CHEST 2 VIEW dated 12/23/2010  FINDINGS: The film was taken in a lordotic projection. The lung volumes are mildly decreased. The cardiopericardial silhouette is markedly enlarged as compared to the previous study. There is no definite pleural  effusion but basilar atelectasis is suspected especially on the left. The pulmonary vascularity is minimally prominent centrally.  IMPRESSION: There is bilateral pulmonary hyperinflation with new enlargement of the cardiac silhouette. The findings may reflect congestive heart failure. A pericardial effusion is not excluded.   Electronically Signed   By: David  Martinique   On: 04/22/2013 17:07   Ct Angio Chest Aorta W/cm &/or Wo/cm  04/22/2013   CLINICAL DATA:  Rule out dissection  EXAM: CT ANGIOGRAPHY CHEST WITH CONTRAST  TECHNIQUE: Multidetector CT imaging of the chest was performed using the standard protocol during bolus administration of intravenous contrast. Multiplanar CT image reconstructions and MIPs were obtained to evaluate the vascular anatomy.  CONTRAST:  148mL OMNIPAQUE IOHEXOL 350 MG/ML SOLN  COMPARISON:  DG CHEST 1V PORT dated 04/22/2013  FINDINGS: A 3 cm left  thyroid nodule is appreciated. Further evaluation with nonemergent thyroid ultrasound recommended. A right subclavian vein stent is appreciated. The thoracic inlet is otherwise unremarkable.  No mediastinal or hilar masses or adenopathy is appreciated. Atherosclerotic calcifications identified within the coronary arteries. The heart is enlarged.  There is no evidence of a thoracic aortic aneurysm nor dissection. There are no filling defects within the main, lobar, or segmental pulmonary arteries.  There areas of diffuse interlobular and subpleural septal thickening in the posterior upper lobes and lung bases. There also linear densities within the lung bases. More focal confluent areas of increased density are appreciated within the posterior dependent portions of the lung bases.  Partial visualization of the liver demonstrates subcapsular oval-shaped areas of low attenuation along the right lobe of the liver. Remaining visualized upper abdominal viscera are unremarkable.  No aggressive appearing osseous lesions are appreciated.  Review of the  MIP images confirms the above findings.  IMPRESSION: 1. No CT evidence of a thoracic aortic aneurysm nor dissection. There is no evidence of pulmonary arterial embolic disease 2. Findings consistent with interstitial fibrosis. There regions of scarring versus atelectasis within the lung bases. Mild infiltrates within the posterior dependent portions of the lungs left greater than right cannot be excluded. 3. Indeterminate subcapsular low attenuating areas along the right lobe of the liver. The patient has a history of prior intra-abdominal surgery and these findings may represent postoperative changes. Clinical correlation recommended. 4. Left thyroid nodules further evaluation with nonemergent thyroid ultrasound recommended.   Electronically Signed   By: Margaree Mackintosh M.D.   On: 04/22/2013 21:16   ASSESSMENT / PLAN:  PULMONARY  A:  Hypoxemic respiratory failure Aute pulmonary edema Probable HCAP P: SpO2>92 Supplemental oxygen PRN Abx / fluid removal as below  CARDIOVASCULAR  A:  Shock ( septic vs cardiogenic ) NSTEMI Acute systolic congestive heart failure Probable ischemic and septic cardiomyopathy P:  Cardiology following Goal MAP>65 Trend troponin / lactate Levophed gtt Vasopressin gtt D/c Neo-Synephrine Start ASA Heparin gtt Statin contraindicated - elevated transaminases BB / ACEI contraindicated - shock Cath lab declined by Cardiology at this time CVP  RENAL  A:  ESRD Metabolic acidosis Fluid overload P:  Renal following Trend BMET Place HD cath CVVHD  GASTROINTESTINAL  A:  Nutrition GI Px  Mildly elevated transaminases P:  Pepcid NPO as at risk for intubation / aspiration  HEMATOLOGIC  A:  Anemia, stable Heparinization for NSTEMI VTE Px is not indicated P:  Trend CBC Heparin per pharmacy, will stop after 48 hours  INFECTIOUS  A:  Possible sepsis, no clear source  Possible C. Diff as diarrhea and elevated WBC and PCT P:  Cx / abx as  above C.Diff PCR Add Flagyl IV   ENDOCRINE  A:  Presumed adrenal insufficiency P:  Stress dose steroids  NEUROLOGIC  A:  No active issues P:  Fentanyl PRN  I have personally obtained history, examined patient, evaluated and interpreted laboratory and imaging results, reviewed medical records, formulated assessment / plan and placed orders.  CRITICAL CARE:  The patient is critically ill with multiple organ systems failure and requires high complexity decision making for assessment and support, frequent evaluation and titration of therapies, application of advanced monitoring technologies and extensive interpretation of multiple databases. Critical Care Time devoted to patient care services described in this note is 35 minutes.   Doree Fudge, MD Pulmonary and [place] Pager: [phone]  04/24/2013, 8:06 AM

## 2013-04-24 ENCOUNTER — Inpatient Hospital Stay (HOSPITAL_COMMUNITY): Payer: Medicare Other

## 2013-04-24 DIAGNOSIS — R6521 Severe sepsis with septic shock: Secondary | ICD-10-CM

## 2013-04-24 DIAGNOSIS — R6520 Severe sepsis without septic shock: Secondary | ICD-10-CM

## 2013-04-24 DIAGNOSIS — A419 Sepsis, unspecified organism: Secondary | ICD-10-CM

## 2013-04-24 DIAGNOSIS — A0472 Enterocolitis due to Clostridium difficile, not specified as recurrent: Secondary | ICD-10-CM

## 2013-04-24 LAB — CBC
HEMATOCRIT: 28.2 % — AB (ref 39.0–52.0)
Hemoglobin: 9.4 g/dL — ABNORMAL LOW (ref 13.0–17.0)
MCH: 30.2 pg (ref 26.0–34.0)
MCHC: 33.3 g/dL (ref 30.0–36.0)
MCV: 90.7 fL (ref 78.0–100.0)
Platelets: 109 10*3/uL — ABNORMAL LOW (ref 150–400)
RBC: 3.11 MIL/uL — AB (ref 4.22–5.81)
RDW: 16.8 % — ABNORMAL HIGH (ref 11.5–15.5)
WBC: 38.7 10*3/uL — ABNORMAL HIGH (ref 4.0–10.5)

## 2013-04-24 LAB — POCT ACTIVATED CLOTTING TIME
Activated Clotting Time: 188 seconds
Activated Clotting Time: 171 seconds
Activated Clotting Time: 188 seconds
Activated Clotting Time: 188 seconds
Activated Clotting Time: 188 seconds
Activated Clotting Time: 193 seconds
Activated Clotting Time: 199 seconds

## 2013-04-24 LAB — RENAL FUNCTION PANEL
Albumin: 2.6 g/dL — ABNORMAL LOW (ref 3.5–5.2)
Albumin: 2.7 g/dL — ABNORMAL LOW (ref 3.5–5.2)
BUN: 126 mg/dL — AB (ref 6–23)
BUN: 119 mg/dL — ABNORMAL HIGH (ref 6–23)
CHLORIDE: 93 meq/L — AB (ref 96–112)
CO2: 16 meq/L — AB (ref 19–32)
CO2: 16 meq/L — AB (ref 19–32)
CREATININE: 10.35 mg/dL — AB (ref 0.50–1.35)
Calcium: 7.6 mg/dL — ABNORMAL LOW (ref 8.4–10.5)
Calcium: 7.6 mg/dL — ABNORMAL LOW (ref 8.4–10.5)
Chloride: 96 mEq/L (ref 96–112)
Creatinine, Ser: 12.1 mg/dL — ABNORMAL HIGH (ref 0.50–1.35)
GFR calc non Af Amer: 4 mL/min — ABNORMAL LOW (ref >90)
GFR, EST AFRICAN AMERICAN: 4 mL/min — AB (ref >90)
GFR, EST AFRICAN AMERICAN: 5 mL/min — AB (ref >90)
GFR, EST NON AFRICAN AMERICAN: 5 mL/min — AB (ref >90)
GLUCOSE: 95 mg/dL (ref 70–99)
GLUCOSE: 92 mg/dL (ref 70–99)
PHOSPHORUS: 10.4 mg/dL — AB (ref 2.3–4.6)
PHOSPHORUS: 8.5 mg/dL — AB (ref 2.3–4.6)
POTASSIUM: 4.6 meq/L (ref 3.7–5.3)
Potassium: 4.3 mEq/L (ref 3.7–5.3)
Sodium: 136 mEq/L — ABNORMAL LOW (ref 137–147)
Sodium: 138 mEq/L (ref 137–147)

## 2013-04-24 LAB — HEPARIN LEVEL (UNFRACTIONATED)
Heparin Unfractionated: 0.22 IU/mL — ABNORMAL LOW (ref 0.30–0.70)
Heparin Unfractionated: 0.63 IU/mL (ref 0.30–0.70)

## 2013-04-24 LAB — GLUCOSE, CAPILLARY
GLUCOSE-CAPILLARY: 67 mg/dL — AB (ref 70–99)
GLUCOSE-CAPILLARY: 123 mg/dL — AB (ref 70–99)
Glucose-Capillary: 105 mg/dL — ABNORMAL HIGH (ref 70–99)

## 2013-04-24 LAB — TROPONIN I
Troponin I: 11.84 ng/mL (ref <0.30)
Troponin I: 5.01 ng/mL (ref <0.30)

## 2013-04-24 LAB — CLOSTRIDIUM DIFFICILE BY PCR: CDIFFPCR: POSITIVE — AB

## 2013-04-24 LAB — PROCALCITONIN: Procalcitonin: >175 ng/mL

## 2013-04-24 LAB — LACTIC ACID, PLASMA: Lactic Acid, Venous: 1.6 mmol/L (ref 0.5–2.2)

## 2013-04-24 MED ORDER — SODIUM CHLORIDE 0.9 % IJ SOLN
250.0000 [IU]/h | INTRAMUSCULAR | Status: DC
  Administered 2013-04-24: 1000 [IU]/h via INTRAVENOUS_CENTRAL
  Administered 2013-04-24: 1250 [IU]/h via INTRAVENOUS_CENTRAL
  Administered 2013-04-25: 1400 [IU]/h via INTRAVENOUS_CENTRAL
  Administered 2013-04-25: 1250 [IU]/h via INTRAVENOUS_CENTRAL
  Administered 2013-04-25: 1800 [IU]/h via INTRAVENOUS_CENTRAL
  Administered 2013-04-26: 2050 [IU]/h via INTRAVENOUS_CENTRAL
  Administered 2013-04-26 (×2): 1800 [IU]/h via INTRAVENOUS_CENTRAL
  Administered 2013-04-26: 1900 [IU]/h via INTRAVENOUS_CENTRAL
  Administered 2013-04-27: 2300 [IU]/h via INTRAVENOUS_CENTRAL
  Administered 2013-04-27: 2400 [IU]/h via INTRAVENOUS_CENTRAL
  Administered 2013-04-27: 2150 [IU]/h via INTRAVENOUS_CENTRAL
  Administered 2013-04-27: 2200 [IU]/h via INTRAVENOUS_CENTRAL
  Administered 2013-04-27: 2150 [IU]/h via INTRAVENOUS_CENTRAL
  Administered 2013-04-28: 2250 [IU]/h via INTRAVENOUS_CENTRAL
  Administered 2013-04-28 (×3): 2100 [IU]/h via INTRAVENOUS_CENTRAL
  Administered 2013-04-28: 2250 [IU]/h via INTRAVENOUS_CENTRAL
  Administered 2013-04-28 – 2013-04-29 (×2): 2100 [IU]/h via INTRAVENOUS_CENTRAL
  Filled 2013-04-24 (×22): qty 2

## 2013-04-24 MED ORDER — FAMOTIDINE IN NACL 20-0.9 MG/50ML-% IV SOLN
20.0000 mg | INTRAVENOUS | Status: DC
  Administered 2013-04-24 – 2013-04-27 (×4): 20 mg via INTRAVENOUS
  Filled 2013-04-24 (×4): qty 50

## 2013-04-24 MED ORDER — METRONIDAZOLE IN NACL 5-0.79 MG/ML-% IV SOLN
500.0000 mg | Freq: Three times a day (TID) | INTRAVENOUS | Status: DC
  Administered 2013-04-24 – 2013-04-28 (×12): 500 mg via INTRAVENOUS
  Filled 2013-04-24 (×14): qty 100

## 2013-04-24 MED ORDER — DEXTROSE 5 % IV SOLN
INTRAVENOUS | Status: DC
  Administered 2013-04-24: 500 mL via INTRAVENOUS
  Administered 2013-04-26: 20 mL via INTRAVENOUS
  Administered 2013-04-28: 50 mL via INTRAVENOUS

## 2013-04-24 MED ORDER — ASPIRIN 325 MG PO TABS
325.0000 mg | ORAL_TABLET | Freq: Every day | ORAL | Status: DC
  Administered 2013-04-24 – 2013-05-01 (×8): 325 mg via ORAL
  Filled 2013-04-24 (×9): qty 1

## 2013-04-24 MED ORDER — SODIUM CHLORIDE 0.9 % FOR CRRT
INTRAVENOUS_CENTRAL | Status: DC | PRN
Start: 2013-04-24 — End: 2013-05-02
  Filled 2013-04-24: qty 1000

## 2013-04-24 MED ORDER — PRISMASOL BGK 4/2.5 32-4-2.5 MEQ/L IV SOLN
INTRAVENOUS | Status: DC
  Administered 2013-04-24 – 2013-04-29 (×31): via INTRAVENOUS_CENTRAL
  Filled 2013-04-24 (×52): qty 5000

## 2013-04-24 MED ORDER — ASPIRIN 300 MG RE SUPP: 300.0000 mg | Freq: Every day | RECTAL | Status: DC

## 2013-04-24 MED ORDER — VANCOMYCIN HCL IN DEXTROSE 1-5 GM/200ML-% IV SOLN
1000.0000 mg | INTRAVENOUS | Status: DC
  Administered 2013-04-24: 1000 mg via INTRAVENOUS
  Filled 2013-04-24 (×2): qty 200

## 2013-04-24 MED ORDER — VANCOMYCIN HCL IN DEXTROSE 1-5 GM/200ML-% IV SOLN
1000.0000 mg | INTRAVENOUS | Status: DC
  Filled 2013-04-24: qty 200

## 2013-04-24 MED ORDER — HEPARIN (PORCINE) IN NACL 100-0.45 UNIT/ML-% IJ SOLN
2000.0000 [IU]/h | INTRAMUSCULAR | Status: DC
  Administered 2013-04-24: 2000 [IU]/h via INTRAVENOUS
  Filled 2013-04-24 (×2): qty 250

## 2013-04-24 MED ORDER — PANTOPRAZOLE SODIUM 40 MG IV SOLR: 40.0000 mg | INTRAVENOUS | Status: DC

## 2013-04-24 MED ORDER — DEXTROSE 5 % IV SOLN
2.0000 g | Freq: Two times a day (BID) | INTRAVENOUS | Status: DC
  Filled 2013-04-24: qty 2

## 2013-04-24 MED ORDER — CEFTAZIDIME 2 G IJ SOLR
2.0000 g | Freq: Two times a day (BID) | INTRAMUSCULAR | Status: DC
  Administered 2013-04-24: 2 g via INTRAVENOUS
  Filled 2013-04-24 (×3): qty 2

## 2013-04-24 MED ORDER — ROPINIROLE HCL 1 MG PO TABS
9.0000 mg | ORAL_TABLET | Freq: Every day | ORAL | Status: DC
  Filled 2013-04-24: qty 9

## 2013-04-24 MED ORDER — HEPARIN SODIUM (PORCINE) 1000 UNIT/ML DIALYSIS
1000.0000 [IU] | INTRAMUSCULAR | Status: DC | PRN
  Administered 2013-04-24: 2800 [IU] via INTRAVENOUS_CENTRAL
  Filled 2013-04-24: qty 3
  Filled 2013-04-24: qty 6

## 2013-04-24 MED ORDER — PRISMASOL BGK 4/2.5 32-4-2.5 MEQ/L IV SOLN
INTRAVENOUS | Status: DC
  Administered 2013-04-24 – 2013-04-28 (×4): via INTRAVENOUS_CENTRAL
  Filled 2013-04-24 (×10): qty 5000

## 2013-04-24 MED ORDER — DEXTROSE 50 % IV SOLN
25.0000 mL | Freq: Once | INTRAVENOUS | Status: AC | PRN
  Administered 2013-04-24: 25 mL via INTRAVENOUS
  Filled 2013-04-24: qty 50

## 2013-04-24 MED ORDER — ROPINIROLE HCL 1 MG PO TABS
1.0000 mg | ORAL_TABLET | Freq: Every day | ORAL | Status: DC
  Administered 2013-04-24 – 2013-05-02 (×10): 1 mg via ORAL
  Filled 2013-04-24 (×12): qty 1

## 2013-04-24 MED ORDER — HEPARIN (PORCINE) IN NACL 100-0.45 UNIT/ML-% IJ SOLN
2000.0000 [IU]/h | INTRAMUSCULAR | Status: DC
  Filled 2013-04-24 (×2): qty 250

## 2013-04-24 MED ORDER — CHLORHEXIDINE GLUCONATE 0.12 % MT SOLN
15.0000 mL | Freq: Two times a day (BID) | OROMUCOSAL | Status: DC
  Administered 2013-04-24 – 2013-04-25 (×3): 15 mL via OROMUCOSAL
  Filled 2013-04-24 (×3): qty 15

## 2013-04-24 MED ORDER — MORPHINE SULFATE 2 MG/ML IJ SOLN: 1.0000 mg | INTRAMUSCULAR | Status: DC | PRN

## 2013-04-24 MED ORDER — CEFTAZIDIME 2 G IJ SOLR
2.0000 g | Freq: Two times a day (BID) | INTRAMUSCULAR | Status: DC
  Filled 2013-04-24: qty 2

## 2013-04-24 MED ORDER — METRONIDAZOLE IN NACL 5-0.79 MG/ML-% IV SOLN
500.0000 mg | Freq: Three times a day (TID) | INTRAVENOUS | Status: DC
  Administered 2013-04-24: 500 mg via INTRAVENOUS
  Filled 2013-04-24 (×3): qty 100

## 2013-04-24 MED ORDER — HEPARIN BOLUS VIA INFUSION (CRRT)
1000.0000 [IU] | INTRAVENOUS | Status: DC | PRN
  Filled 2013-04-24: qty 1000

## 2013-04-24 MED ORDER — VANCOMYCIN 50 MG/ML ORAL SOLUTION
500.0000 mg | Freq: Four times a day (QID) | ORAL | Status: DC
  Administered 2013-04-24 – 2013-05-03 (×35): 500 mg via ORAL
  Filled 2013-04-24 (×43): qty 10

## 2013-04-24 MED ORDER — PRISMASOL BGK 4/2.5 32-4-2.5 MEQ/L IV SOLN
INTRAVENOUS | Status: DC
  Administered 2013-04-24 – 2013-04-29 (×8): via INTRAVENOUS_CENTRAL
  Filled 2013-04-24 (×13): qty 5000

## 2013-04-24 MED ORDER — BIOTENE DRY MOUTH MT LIQD
15.0000 mL | Freq: Two times a day (BID) | OROMUCOSAL | Status: DC
  Administered 2013-04-24 – 2013-04-25 (×3): 15 mL via OROMUCOSAL

## 2013-04-24 NOTE — Progress Notes (Signed)
eLink Physician-Brief Progress Note Patient Name: John HATFIELD Sr. DOB: 11-04-1950 MRN: [id number]  Date of Service  04/24/2013   HPI/Events of Note   C diff positive .  Pt with shock state   eICU Interventions  Start vanco po in addition to iv flagyl Contact isolation   Intervention Category Major Interventions: Infection - evaluation and management  Elsie Stain 04/24/2013, 4:14 PM
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
## 2013-04-24 NOTE — Progress Notes (Signed)
CCM Interim Progress Note.  RN noted patient intermittently complains of abdominal pain.  CT abd reveals colitis/duodenitis/pancreatitis. Patient opiate naive, and BP soft on 3 pressors (titrating off neosynephrine).  Treat pain with morphine 1mg  IV q4h prn, Hold & Call MD if SBP<90, HR<65, RR<10, O2<90, or altered mental status.

## 2013-04-24 NOTE — Progress Notes (Signed)
SUBJECTIVE:  No CP at present  OBJECTIVE:   Vitals:   Filed Vitals:   04/24/13 0900 04/24/13 1000 04/24/13 1100 04/24/13 1209  BP: 89/45 97/52 62/30    Pulse: 74 73 70   Temp:    97.8 F (36.6 C)  TempSrc:    Oral  Resp: 31 23 31    Height:      Weight:      SpO2: 100% 97% 98%    I&O's:   Intake/Output Summary (Last 24 hours) at 04/24/13 1530 Last data filed at 04/24/13 1500  Gross per 24 hour  Intake 1303.63 ml  Output    615 ml  Net 688.63 ml   TELEMETRY: Reviewed telemetry pt in NSR:     PHYSICAL EXAM General: Well developed, well nourished, in no acute distress Head: Eyes PERRLA, No xanthomas.   Normal cephalic and atramatic  Lungs:   Clear bilaterally to auscultation and percussion. Heart:   HRRR S1 S2 Pulses are 2+ & equal. Abdomen: Bowel sounds are positive, abdomen soft and non-tender without masses Extremities:   No clubbing, cyanosis or edema.  DP +1 Neuro: Alert and oriented X 3. Psych:  Good affect, responds appropriately   LABS: Basic Metabolic Panel:  Recent Labs  04/23/13 0413 04/24/13 0832  NA 138 136*  K 4.3 4.6  CL 94* 93*  CO2 16* 16*  GLUCOSE 127* 95  BUN 105* 126*  CREATININE 12.32* 12.10*  CALCIUM 8.8 7.6*  PHOS  --  10.4*   Liver Function Tests:  Recent Labs  04/22/13 1540 04/24/13 0832  AST 89*  --   ALT 88*  --   ALKPHOS 50  --   BILITOT 1.1  --   PROT 6.6  --   ALBUMIN 3.1* 2.6*   No results found for this basename: LIPASE, AMYLASE,  in the last 72 hours CBC:  Recent Labs  04/22/13 1540 04/23/13 0413 04/24/13 0500  WBC 14.2* 37.6* 38.7*  NEUTROABS 13.6*  --   --   HGB 10.1* 10.2* 9.4*  HCT 30.0* 31.3* 28.2*  MCV 92.0 93.2 90.7  PLT 120* 134* 109*   Cardiac Enzymes:  Recent Labs  04/23/13 2019 04/24/13 0200 04/24/13 0800  TROPONINI 17.40* 11.84* 5.01*   BNP: No components found with this basename: POCBNP,  D-Dimer: No results found for this basename: DDIMER,  in the last 72 hours Hemoglobin  A1C: No results found for this basename: HGBA1C,  in the last 72 hours Fasting Lipid Panel: No results found for this basename: CHOL, HDL, LDLCALC, TRIG, CHOLHDL, LDLDIRECT,  in the last 72 hours Thyroid Function Tests: No results found for this basename: TSH, T4TOTAL, FREET3, T3FREE, THYROIDAB,  in the last 72 hours Anemia Panel: No results found for this basename: VITAMINB12, FOLATE, FERRITIN, TIBC, IRON, RETICCTPCT,  in the last 72 hours Coag Panel:   Lab Results  Component Value Date   INR 1.08 03/10/2011   INR 1.45 03/25/2010   INR 1.35 03/11/2010    RADIOLOGY: Ct Abdomen Pelvis W Contrast  04/23/2013   CLINICAL DATA:  Worsening hypovolemic shock and history of end-stage renal disease.  EXAM: CT ABDOMEN AND PELVIS WITH CONTRAST  TECHNIQUE: Multidetector CT imaging of the abdomen and pelvis was performed using the standard protocol following bolus administration of intravenous contrast.  CONTRAST:  168mL OMNIPAQUE IOHEXOL 300 MG/ML  SOLN  COMPARISON:  DG CHEST 1V PORT dated 04/23/2013; DG CHEST 1V PORT dated 04/23/2013  FINDINGS: The visualized lung bases show dense consolidation of  both lower lobes, right greater than left. There is a trace amount of right pleural fluid. Air bronchograms are present in areas of consolidation and findings are consistent with pneumonia and potentially aspiration.  The liver demonstrates cirrhotic changes without mass or biliary dilatation. There is a small rim of fluid anterior to the liver. The spleen, pancreas, adrenal glands and kidneys are unremarkable.  The colon is decompressed but demonstrates evidence of probable wall thickening and he see inflammatory change, especially at the level of the ascending colon and proximal transverse colon. Findings are suggestive of colitis. There is no evidence of free intraperitoneal air or focal abscess. Some mild edema and stranding is noted trauma to the retroperitoneum. There also is some thickening and inflammatory change  involving the transverse duodenum that abuts the head of the pancreas. Correlation suggested with any possibility of pancreatitis. No peripancreatic fluid collections are identified.  No vascular abnormalities seen. No focal masses or enlarged lymph nodes are seen. The bladder is decompressed. Bony structures are unremarkable.  IMPRESSION: 1. Pulmonary consolidation with air bronchograms in both lower lobes, right greater than left. Findings are consistent with pneumonia and potentially aspiration. 2. Decompressed but thickened appearance of the colon may be consistent with colitis. There is no evidence of bowel perforation. 3. Diffuse inflammatory stranding throughout the retroperitoneum with some thickening inflammatory change involving the transverse duodenum that abuts the head of the pancreas. This may represent changes related to peptic ulcer disease/duodenitis or pancreatitis. 4. Cirrhotic changes of the liver.   Electronically Signed   By: Aletta Edouard M.D.   On: 04/23/2013 22:38   Dg Chest Port 1 View  04/24/2013   CLINICAL DATA:  Hypoxia  EXAM: PORTABLE CHEST - 1 VIEW  COMPARISON:  DG CHEST 1V PORT dated 04/23/2013  FINDINGS: There is increased bilateral interstitial thickening and prominence of the central pulmonary vasculature. There are bilateral small pleural effusions. There is no pneumothorax. Stable cardiomegaly. Left jugular central venous catheter in unchanged position. The osseous structures are unremarkable.  IMPRESSION: Overall findings are concerning for worsening pulmonary edema.   Electronically Signed   By: Kathreen Devoid   On: 04/24/2013 04:09   Dg Chest Port 1 View  04/23/2013   CLINICAL DATA:  Pulmonary edema  EXAM: PORTABLE CHEST - 1 VIEW  COMPARISON:  DG CHEST 1V PORT dated 04/23/2013; DG CHEST 1V PORT dated 04/22/2013; CT ANGIO CHEST AORTA W/CM & WO/CM dated 04/22/2013; DG CHEST 1V PORT dated 04/22/2013; DG CHEST 2 VIEW dated 12/23/2010  FINDINGS: Grossly unchanged enlarged cardiac  silhouette and mediastinal contours. Stable positioning of support apparatus. The pulmonary vasculature remains indistinct with cephalization of flow. There is chronic blunting of the left costophrenic angle without definite pleural effusion. No pneumothorax. A vascular stent overlies right lung apex. Unchanged bones.  IMPRESSION: Grossly unchanged findings of cardiomegaly and mild pulmonary edema.   Electronically Signed   By: Sandi Mariscal M.D.   On: 04/23/2013 08:08   Dg Chest Port 1 View  04/23/2013   CLINICAL DATA:  Central line placement.  EXAM: PORTABLE CHEST - 1 VIEW  COMPARISON:  04/22/2013  FINDINGS: Interval placement of a left central venous catheter. The tip is localized over the mid SVC region and directed somewhat laterally, likely at or near the origin of the brachiocephalic vein. No pneumothorax. Cardiac enlargement with small left pleural effusion. Pulmonary vascularity is normal. Vascular stent projected over the right subclavian region.  IMPRESSION: Left central venous catheter tip localizes over the mid SVC,  directed somewhat laterally. Persistent cardiac enlargement and small pleural effusions.   Electronically Signed   By: Lucienne Capers M.D.   On: 04/23/2013 00:51   Dg Chest Port 1 View  04/22/2013   CLINICAL DATA:  post line placement post line placement  EXAM: PORTABLE CHEST - 1 VIEW  COMPARISON:  None.  FINDINGS: Low lung volumes. The cardiac silhouette is enlarged. Central venous catheter tip appreciated in the region of distal right internal jugular vein. Repositioning and advancement is recommended. There is no evidence of pneumothorax. The lungs are clear. The osseous structures demonstrate no acute abnormalities. Mild degenerative changes of the right shoulder.  IMPRESSION: Repositioning of the patient's right internal jugular catheter is recommended. These findings were discussed with Dr. Stark Jock of the emergency department who has informed the patient's critical care attending of  these findings.  No acute cardiopulmonary disease.   Electronically Signed   By: Margaree Mackintosh M.D.   On: 04/22/2013 20:16   Dg Chest Portable 1 View  04/22/2013   CLINICAL DATA:  Generalized body aches and emesis  EXAM: PORTABLE CHEST - 1 VIEW  COMPARISON:  Chest radiograph 04/22/2013 and 12/23/2010  FINDINGS: Right subclavian vascular stent noted. Numerous cardiac leads project over the chest. Cardiopericardial silhouette appears enlarged, similar compared to today's earlier chest radiograph. Please note the heart size has increased since the prior study of 2012, for which pericardial effusion cannot be excluded. There is pulmonary vascular congestion. No definite pulmonary edema. Negative for consolidation.  IMPRESSION: No significant change compared to the radiograph performed earlier today. Assistant cardiomegaly and mild prominence of the pulmonary vascularity.   Electronically Signed   By: Curlene Dolphin M.D.   On: 04/22/2013 19:31   Dg Chest Port 1 View  04/22/2013   CLINICAL DATA:  fever, weakness, hypotension  EXAM: PORTABLE CHEST - 1 VIEW  COMPARISON:  DG CHEST 2 VIEW dated 12/23/2010  FINDINGS: The film was taken in a lordotic projection. The lung volumes are mildly decreased. The cardiopericardial silhouette is markedly enlarged as compared to the previous study. There is no definite pleural effusion but basilar atelectasis is suspected especially on the left. The pulmonary vascularity is minimally prominent centrally.  IMPRESSION: There is bilateral pulmonary hyperinflation with new enlargement of the cardiac silhouette. The findings may reflect congestive heart failure. A pericardial effusion is not excluded.   Electronically Signed   By: David  Martinique   On: 04/22/2013 17:07   Ct Angio Chest Aorta W/cm &/or Wo/cm  04/22/2013   CLINICAL DATA:  Rule out dissection  EXAM: CT ANGIOGRAPHY CHEST WITH CONTRAST  TECHNIQUE: Multidetector CT imaging of the chest was performed using the standard  protocol during bolus administration of intravenous contrast. Multiplanar CT image reconstructions and MIPs were obtained to evaluate the vascular anatomy.  CONTRAST:  167mL OMNIPAQUE IOHEXOL 350 MG/ML SOLN  COMPARISON:  DG CHEST 1V PORT dated 04/22/2013  FINDINGS: A 3 cm left thyroid nodule is appreciated. Further evaluation with nonemergent thyroid ultrasound recommended. A right subclavian vein stent is appreciated. The thoracic inlet is otherwise unremarkable.  No mediastinal or hilar masses or adenopathy is appreciated. Atherosclerotic calcifications identified within the coronary arteries. The heart is enlarged.  There is no evidence of a thoracic aortic aneurysm nor dissection. There are no filling defects within the main, lobar, or segmental pulmonary arteries.  There areas of diffuse interlobular and subpleural septal thickening in the posterior upper lobes and lung bases. There also linear densities within the lung bases. More  focal confluent areas of increased density are appreciated within the posterior dependent portions of the lung bases.  Partial visualization of the liver demonstrates subcapsular oval-shaped areas of low attenuation along the right lobe of the liver. Remaining visualized upper abdominal viscera are unremarkable.  No aggressive appearing osseous lesions are appreciated.  Review of the MIP images confirms the above findings.  IMPRESSION: 1. No CT evidence of a thoracic aortic aneurysm nor dissection. There is no evidence of pulmonary arterial embolic disease 2. Findings consistent with interstitial fibrosis. There regions of scarring versus atelectasis within the lung bases. Mild infiltrates within the posterior dependent portions of the lungs left greater than right cannot be excluded. 3. Indeterminate subcapsular low attenuating areas along the right lobe of the liver. The patient has a history of prior intra-abdominal surgery and these findings may represent postoperative changes.  Clinical correlation recommended. 4. Left thyroid nodules further evaluation with nonemergent thyroid ultrasound recommended.   Electronically Signed   By: Margaree Mackintosh M.D.   On: 04/22/2013 21:16    ASSESSMENT:  1.  Hypoxemic respiratory failure with acute pulmonary edema and probable HCAP.   2.  Septic shock - source of sepsis ?  CT abdomen with possble colitis without perforation and possible duodenitis/pancreatitis.  Diarrhea with fever and increased WBC ? C Diff.  Currently on Levophed/Vasopressin for pressor support.  His LVF is normal and therefore this is unlikely to be due to cardiogenic etiology.   3.  NSTEMI - He probably has underlying CAD - cardiac enzymes went quite high to just be from demand ischemia. I suspect this is demand ischemia in the setting of severe hypotension and significant CAD. He has not been taken to the cath lab yet due to hemodynamic instability.  This is unlikely to be secondary to acute plaque rupture with no ST elevation on EKG and normal LVF on echo done 4/12 although echo 4/11 showed EF 45% with no WMA's which could be explained by myocardial depression from sepsis . His chest pain was atypical and was sharp .  Chest CT with no dissection or PE.  Currently on IV Heparin gtt/ASA. 4.  ESRD on CVVHD  PLAN:   1.  Continue pressor support for now.   2.  Continue IV heparin gtt and ASA 3.  No indication for cath at this time due to lack of ST elevation on EKG, normal LVF with no wall motion abnormality on echo in setting of active sepsis with elevated WBC which continues to trend upward 4.  May want to consider SG cath to assess hemodynamics especially to evaluate SVR and if low then this is primarily septic shock.   Sueanne Margarita, MD  04/24/2013  3:30 PM

## 2013-04-24 NOTE — Procedures (Signed)
Supervised, present through the entire procedure.  Doree Fudge, MD [facility] [place] Pager: [phone]

## 2013-04-24 NOTE — Progress Notes (Signed)
S: + SOB  No ABd pain No CP O:BP 65/45  Pulse 78  Temp(Src) 98.4 F (36.9 C) (Oral)  Resp 21  Ht 6' (1.829 m)  Wt 120.2 kg (264 lb 15.9 oz)  BMI 35.93 kg/m2  SpO2 95%  Intake/Output Summary (Last 24 hours) at 04/24/13 0821 Last data filed at 04/24/13 0600  Gross per 24 hour  Intake 1248.88 ml  Output    401 ml  Net 847.88 ml   Weight change: 6.801 kg (14 lb 15.9 oz) JIR:CVELF and alert.  On NR mask CVS:RRR Resp:Bilat crackles r>L  + rub on RT Abd:+ BS NTND no HSM Ext: no edema RUA AVF + bruit NEURO:CNI Ox3 No asterixis   . hydrocortisone sod succinate (SOLU-CORTEF) inj  50 mg Intravenous Q6H  . rOPINIRole  1 mg Oral QHS  . vancomycin  1,000 mg Intravenous Once   Ct Abdomen Pelvis W Contrast  04/23/2013   CLINICAL DATA:  Worsening hypovolemic shock and history of end-stage renal disease.  EXAM: CT ABDOMEN AND PELVIS WITH CONTRAST  TECHNIQUE: Multidetector CT imaging of the abdomen and pelvis was performed using the standard protocol following bolus administration of intravenous contrast.  CONTRAST:  146mL OMNIPAQUE IOHEXOL 300 MG/ML  SOLN  COMPARISON:  DG CHEST 1V PORT dated 04/23/2013; DG CHEST 1V PORT dated 04/23/2013  FINDINGS: The visualized lung bases show dense consolidation of both lower lobes, right greater than left. There is a trace amount of right pleural fluid. Air bronchograms are present in areas of consolidation and findings are consistent with pneumonia and potentially aspiration.  The liver demonstrates cirrhotic changes without mass or biliary dilatation. There is a small rim of fluid anterior to the liver. The spleen, pancreas, adrenal glands and kidneys are unremarkable.  The colon is decompressed but demonstrates evidence of probable wall thickening and he see inflammatory change, especially at the level of the ascending colon and proximal transverse colon. Findings are suggestive of colitis. There is no evidence of free intraperitoneal air or focal abscess. Some  mild edema and stranding is noted trauma to the retroperitoneum. There also is some thickening and inflammatory change involving the transverse duodenum that abuts the head of the pancreas. Correlation suggested with any possibility of pancreatitis. No peripancreatic fluid collections are identified.  No vascular abnormalities seen. No focal masses or enlarged lymph nodes are seen. The bladder is decompressed. Bony structures are unremarkable.  IMPRESSION: 1. Pulmonary consolidation with air bronchograms in both lower lobes, right greater than left. Findings are consistent with pneumonia and potentially aspiration. 2. Decompressed but thickened appearance of the colon may be consistent with colitis. There is no evidence of bowel perforation. 3. Diffuse inflammatory stranding throughout the retroperitoneum with some thickening inflammatory change involving the transverse duodenum that abuts the head of the pancreas. This may represent changes related to peptic ulcer disease/duodenitis or pancreatitis. 4. Cirrhotic changes of the liver.   Electronically Signed   By: Aletta Edouard M.D.   On: 04/23/2013 22:38   Dg Chest Port 1 View  04/24/2013   CLINICAL DATA:  Hypoxia  EXAM: PORTABLE CHEST - 1 VIEW  COMPARISON:  DG CHEST 1V PORT dated 04/23/2013  FINDINGS: There is increased bilateral interstitial thickening and prominence of the central pulmonary vasculature. There are bilateral small pleural effusions. There is no pneumothorax. Stable cardiomegaly. Left jugular central venous catheter in unchanged position. The osseous structures are unremarkable.  IMPRESSION: Overall findings are concerning for worsening pulmonary edema.   Electronically Signed  By: Kathreen Devoid   On: 04/24/2013 04:09   Dg Chest Port 1 View  04/23/2013   CLINICAL DATA:  Pulmonary edema  EXAM: PORTABLE CHEST - 1 VIEW  COMPARISON:  DG CHEST 1V PORT dated 04/23/2013; DG CHEST 1V PORT dated 04/22/2013; CT ANGIO CHEST AORTA W/CM & WO/CM dated  04/22/2013; DG CHEST 1V PORT dated 04/22/2013; DG CHEST 2 VIEW dated 12/23/2010  FINDINGS: Grossly unchanged enlarged cardiac silhouette and mediastinal contours. Stable positioning of support apparatus. The pulmonary vasculature remains indistinct with cephalization of flow. There is chronic blunting of the left costophrenic angle without definite pleural effusion. No pneumothorax. A vascular stent overlies right lung apex. Unchanged bones.  IMPRESSION: Grossly unchanged findings of cardiomegaly and mild pulmonary edema.   Electronically Signed   By: Sandi Mariscal M.D.   On: 04/23/2013 08:08   Dg Chest Port 1 View  04/23/2013   CLINICAL DATA:  Central line placement.  EXAM: PORTABLE CHEST - 1 VIEW  COMPARISON:  04/22/2013  FINDINGS: Interval placement of a left central venous catheter. The tip is localized over the mid SVC region and directed somewhat laterally, likely at or near the origin of the brachiocephalic vein. No pneumothorax. Cardiac enlargement with small left pleural effusion. Pulmonary vascularity is normal. Vascular stent projected over the right subclavian region.  IMPRESSION: Left central venous catheter tip localizes over the mid SVC, directed somewhat laterally. Persistent cardiac enlargement and small pleural effusions.   Electronically Signed   By: Lucienne Capers M.D.   On: 04/23/2013 00:51   Dg Chest Port 1 View  04/22/2013   CLINICAL DATA:  post line placement post line placement  EXAM: PORTABLE CHEST - 1 VIEW  COMPARISON:  None.  FINDINGS: Low lung volumes. The cardiac silhouette is enlarged. Central venous catheter tip appreciated in the region of distal right internal jugular vein. Repositioning and advancement is recommended. There is no evidence of pneumothorax. The lungs are clear. The osseous structures demonstrate no acute abnormalities. Mild degenerative changes of the right shoulder.  IMPRESSION: Repositioning of the patient's right internal jugular catheter is recommended. These  findings were discussed with Dr. Stark Jock of the emergency department who has informed the patient's critical care attending of these findings.  No acute cardiopulmonary disease.   Electronically Signed   By: Margaree Mackintosh M.D.   On: 04/22/2013 20:16   Dg Chest Portable 1 View  04/22/2013   CLINICAL DATA:  Generalized body aches and emesis  EXAM: PORTABLE CHEST - 1 VIEW  COMPARISON:  Chest radiograph 04/22/2013 and 12/23/2010  FINDINGS: Right subclavian vascular stent noted. Numerous cardiac leads project over the chest. Cardiopericardial silhouette appears enlarged, similar compared to today's earlier chest radiograph. Please note the heart size has increased since the prior study of 2012, for which pericardial effusion cannot be excluded. There is pulmonary vascular congestion. No definite pulmonary edema. Negative for consolidation.  IMPRESSION: No significant change compared to the radiograph performed earlier today. Assistant cardiomegaly and mild prominence of the pulmonary vascularity.   Electronically Signed   By: Curlene Dolphin M.D.   On: 04/22/2013 19:31   Dg Chest Port 1 View  04/22/2013   CLINICAL DATA:  fever, weakness, hypotension  EXAM: PORTABLE CHEST - 1 VIEW  COMPARISON:  DG CHEST 2 VIEW dated 12/23/2010  FINDINGS: The film was taken in a lordotic projection. The lung volumes are mildly decreased. The cardiopericardial silhouette is markedly enlarged as compared to the previous study. There is no definite pleural effusion but  basilar atelectasis is suspected especially on the left. The pulmonary vascularity is minimally prominent centrally.  IMPRESSION: There is bilateral pulmonary hyperinflation with new enlargement of the cardiac silhouette. The findings may reflect congestive heart failure. A pericardial effusion is not excluded.   Electronically Signed   By: David  Martinique   On: 04/22/2013 17:07   Ct Angio Chest Aorta W/cm &/or Wo/cm  04/22/2013   CLINICAL DATA:  Rule out dissection  EXAM:  CT ANGIOGRAPHY CHEST WITH CONTRAST  TECHNIQUE: Multidetector CT imaging of the chest was performed using the standard protocol during bolus administration of intravenous contrast. Multiplanar CT image reconstructions and MIPs were obtained to evaluate the vascular anatomy.  CONTRAST:  185mL OMNIPAQUE IOHEXOL 350 MG/ML SOLN  COMPARISON:  DG CHEST 1V PORT dated 04/22/2013  FINDINGS: A 3 cm left thyroid nodule is appreciated. Further evaluation with nonemergent thyroid ultrasound recommended. A right subclavian vein stent is appreciated. The thoracic inlet is otherwise unremarkable.  No mediastinal or hilar masses or adenopathy is appreciated. Atherosclerotic calcifications identified within the coronary arteries. The heart is enlarged.  There is no evidence of a thoracic aortic aneurysm nor dissection. There are no filling defects within the main, lobar, or segmental pulmonary arteries.  There areas of diffuse interlobular and subpleural septal thickening in the posterior upper lobes and lung bases. There also linear densities within the lung bases. More focal confluent areas of increased density are appreciated within the posterior dependent portions of the lung bases.  Partial visualization of the liver demonstrates subcapsular oval-shaped areas of low attenuation along the right lobe of the liver. Remaining visualized upper abdominal viscera are unremarkable.  No aggressive appearing osseous lesions are appreciated.  Review of the MIP images confirms the above findings.  IMPRESSION: 1. No CT evidence of a thoracic aortic aneurysm nor dissection. There is no evidence of pulmonary arterial embolic disease 2. Findings consistent with interstitial fibrosis. There regions of scarring versus atelectasis within the lung bases. Mild infiltrates within the posterior dependent portions of the lungs left greater than right cannot be excluded. 3. Indeterminate subcapsular low attenuating areas along the right lobe of the liver.  The patient has a history of prior intra-abdominal surgery and these findings may represent postoperative changes. Clinical correlation recommended. 4. Left thyroid nodules further evaluation with nonemergent thyroid ultrasound recommended.   Electronically Signed   By: Margaree Mackintosh M.D.   On: 04/22/2013 21:16   BMET    Component Value Date/Time   NA 138 04/23/2013 0413   K 4.3 04/23/2013 0413   CL 94* 04/23/2013 0413   CO2 16* 04/23/2013 0413   GLUCOSE 127* 04/23/2013 0413   BUN 105* 04/23/2013 0413   CREATININE 12.32* 04/23/2013 0413   CALCIUM 8.8 04/23/2013 0413   CALCIUM 9.2 04/23/2010 0500   GFRNONAA 4* 04/23/2013 0413   GFRAA 4* 04/23/2013 0413   CBC    Component Value Date/Time   WBC 38.7* 04/24/2013 0500   RBC 3.11* 04/24/2013 0500   HGB 9.4* 04/24/2013 0500   HCT 28.2* 04/24/2013 0500   PLT 109* 04/24/2013 0500   MCV 90.7 04/24/2013 0500   MCH 30.2 04/24/2013 0500   MCHC 33.3 04/24/2013 0500   RDW 16.8* 04/24/2013 0500   LYMPHSABS 0.3* 04/22/2013 1540   MONOABS 0.3 04/22/2013 1540   EOSABS 0.0 04/22/2013 1540   BASOSABS 0.0 04/22/2013 1540     Assessment: 1. Hypotension and fever most likely sec to sepsis syndrome 2. NSTEMI 3. Anemia 4. Sec HPTH  Plan: 1.  Will start CVVHD as I do not think he will tolerate HD with his hypotension.  Unclear how much of CXR abnormalities due to pulm edema vs infectious process, suspect both 2. Will get info from Home training    Windy Kalata
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
## 2013-04-24 NOTE — Progress Notes (Signed)
ANTICOAGULATION CONSULT NOTE - Follow Up Consult  Pharmacy Consult for heparin Indication: chest pain/ACS  Labs:  Recent Labs  04/22/13 1540  04/23/13 0413  04/23/13 1400 04/23/13 2000 04/23/13 2019 04/24/13 0200 04/24/13 0500  HGB 10.1*  --  10.2*  --   --   --   --   --  9.4*  HCT 30.0*  --  31.3*  --   --   --   --   --  28.2*  PLT 120*  --  134*  --   --   --   --   --  PENDING  HEPARINUNFRC  --   --   --   --   --  <0.10*  --   --  0.22*  CREATININE 12.07*  --  12.32*  --   --   --   --   --   --   TROPONINI  --   < >  --   < > 17.02*  --  17.40* 11.84*  --   < > = values in this interval not displayed.   Assessment: 63yo male remains subtherapeutic on heparin after rate increase though now approaching goal.  Goal of Therapy:  Heparin level 0.3-0.7 units/ml   Plan:  Will increase heparin gtt by 2-3 units/kg/hr to 2000 units/hr and check level in [place], PharmD, BCPS  04/24/2013,5:36 AM

## 2013-04-24 NOTE — Procedures (Signed)
Hemodialysis Insertion Procedure Note John HEWES Sr. [id number] 1950/07/03  Procedure: Insertion of Hemodialysis Catheter Type: 3 port  Indications: Hemodialysis   Procedure Details Consent: Risks of procedure as well as the alternatives and risks of each were explained to the (patient/caregiver).  Consent for procedure obtained. Time Out: Verified patient identification, verified procedure, site/side was marked, verified correct patient position, special equipment/implants available, medications/allergies/relevent history reviewed, required imaging and test results available.  Performed  Maximum sterile technique was used including antiseptics, cap, gloves, gown, hand hygiene, mask and sheet. Skin prep: Chlorhexidine; local anesthetic administered A antimicrobial bonded/coated triple lumen catheter was placed in the right femoral vein using the Seldinger technique. Ultrasound guidance used.yes Catheter placed to 20 cm. Blood aspirated via all 3 ports and then flushed x 3. Line sutured x 2 and dressing applied.  Evaluation Blood flow good Complications: No apparent complications Patient did tolerate procedure well. Chest X-ray ordered to verify placement.  CXR: pending.  Richardson Landry Tyronn Golda ACNP Maryanna Shape PCCM Pager [phone] till 3 pm If no answer page [phone] 04/24/2013, 1:05 PM

## 2013-04-25 ENCOUNTER — Inpatient Hospital Stay (HOSPITAL_COMMUNITY): Payer: Medicare Other

## 2013-04-25 DIAGNOSIS — A0472 Enterocolitis due to Clostridium difficile, not specified as recurrent: Secondary | ICD-10-CM

## 2013-04-25 LAB — RENAL FUNCTION PANEL
ALBUMIN: 2.5 g/dL — AB (ref 3.5–5.2)
ALBUMIN: 2.6 g/dL — AB (ref 3.5–5.2)
BUN: 92 mg/dL — ABNORMAL HIGH (ref 6–23)
BUN: 76 mg/dL — ABNORMAL HIGH (ref 6–23)
CALCIUM: 8.3 mg/dL — AB (ref 8.4–10.5)
CALCIUM: 8.9 mg/dL (ref 8.4–10.5)
CO2: 20 mEq/L (ref 19–32)
CO2: 21 mEq/L (ref 19–32)
Chloride: 98 mEq/L (ref 96–112)
Chloride: 99 mEq/L (ref 96–112)
Creatinine, Ser: 7.67 mg/dL — ABNORMAL HIGH (ref 0.50–1.35)
Creatinine, Ser: 5.8 mg/dL — ABNORMAL HIGH (ref 0.50–1.35)
GFR calc Af Amer: 11 mL/min — ABNORMAL LOW (ref >90)
GFR calc non Af Amer: 7 mL/min — ABNORMAL LOW (ref >90)
GFR, EST AFRICAN AMERICAN: 8 mL/min — AB (ref >90)
GFR, EST NON AFRICAN AMERICAN: 9 mL/min — AB (ref >90)
Glucose, Bld: 91 mg/dL (ref 70–99)
Glucose, Bld: 92 mg/dL (ref 70–99)
PHOSPHORUS: 5.9 mg/dL — AB (ref 2.3–4.6)
PHOSPHORUS: 4.7 mg/dL — AB (ref 2.3–4.6)
Potassium: 4.3 mEq/L (ref 3.7–5.3)
Potassium: 4.4 mEq/L (ref 3.7–5.3)
Sodium: 137 mEq/L (ref 137–147)
Sodium: 137 mEq/L (ref 137–147)

## 2013-04-25 LAB — CBC
HCT: 25.2 % — ABNORMAL LOW (ref 39.0–52.0)
Hemoglobin: 8.5 g/dL — ABNORMAL LOW (ref 13.0–17.0)
MCH: 30.4 pg (ref 26.0–34.0)
MCHC: 33.7 g/dL (ref 30.0–36.0)
MCV: 90 fL (ref 78.0–100.0)
PLATELETS: 84 10*3/uL — AB (ref 150–400)
RBC: 2.8 MIL/uL — AB (ref 4.22–5.81)
RDW: 16.7 % — ABNORMAL HIGH (ref 11.5–15.5)
WBC: 34.8 10*3/uL — ABNORMAL HIGH (ref 4.0–10.5)

## 2013-04-25 LAB — POCT ACTIVATED CLOTTING TIME
ACTIVATED CLOTTING TIME: 177 s
ACTIVATED CLOTTING TIME: 177 s
ACTIVATED CLOTTING TIME: 204 s
ACTIVATED CLOTTING TIME: 210 s
Activated Clotting Time: 199 seconds
Activated Clotting Time: 204 seconds
Activated Clotting Time: 210 seconds
Activated Clotting Time: 210 seconds
Activated Clotting Time: 204 seconds
Activated Clotting Time: 177 seconds
Activated Clotting Time: 182 seconds
Activated Clotting Time: 177 seconds
Activated Clotting Time: 177 seconds

## 2013-04-25 LAB — GLUCOSE, CAPILLARY
GLUCOSE-CAPILLARY: 95 mg/dL (ref 70–99)
GLUCOSE-CAPILLARY: 86 mg/dL (ref 70–99)
GLUCOSE-CAPILLARY: 77 mg/dL (ref 70–99)
GLUCOSE-CAPILLARY: 85 mg/dL (ref 70–99)
Glucose-Capillary: 81 mg/dL (ref 70–99)
Glucose-Capillary: 88 mg/dL (ref 70–99)

## 2013-04-25 LAB — HEPARIN LEVEL (UNFRACTIONATED): Heparin Unfractionated: 0.98 IU/mL — ABNORMAL HIGH (ref 0.30–0.70)

## 2013-04-25 LAB — PROCALCITONIN: Procalcitonin: >175 ng/mL

## 2013-04-25 LAB — MAGNESIUM: Magnesium: 2.4 mg/dL (ref 1.5–2.5)

## 2013-04-25 LAB — APTT: aPTT: >200 seconds (ref 24–37)

## 2013-04-25 MED ORDER — MIDODRINE HCL 5 MG PO TABS
5.0000 mg | ORAL_TABLET | Freq: Three times a day (TID) | ORAL | Status: DC
  Administered 2013-04-25 – 2013-04-27 (×6): 5 mg via ORAL
  Filled 2013-04-25 (×9): qty 1

## 2013-04-25 MED ORDER — HEPARIN SODIUM (PORCINE) 5000 UNIT/ML IJ SOLN
5000.0000 [IU] | Freq: Three times a day (TID) | INTRAMUSCULAR | Status: DC
  Administered 2013-04-25 – 2013-04-28 (×10): 5000 [IU] via SUBCUTANEOUS
  Filled 2013-04-25 (×13): qty 1

## 2013-04-25 MED ORDER — HEPARIN (PORCINE) IN NACL 100-0.45 UNIT/ML-% IJ SOLN: 1700.0000 [IU]/h | INTRAMUSCULAR | Status: DC

## 2013-04-25 NOTE — Progress Notes (Addendum)
ANTICOAGULATION CONSULT NOTE - Follow Up Consult  Pharmacy Consult for heparin  Indication: chest pain/ACS  No Known Allergies  Patient Measurements: Height: 6' (182.9 cm) Weight: 257 lb 0.9 oz (116.6 kg) IBW/kg (Calculated) : 77.6 Heparin Dosing Weight:   Vital Signs: Temp: 97.7 F (36.5 C) (04/14 0418) Temp src: Oral (04/14 0418) Pulse Rate: 61 (04/14 0600)  Labs:  Recent Labs  04/23/13 0413  04/23/13 2019 04/24/13 0200 04/24/13 0500 04/24/13 0800 04/24/13 0832 04/24/13 1600 04/24/13 1950 04/25/13 0440  HGB 10.2*  --   --   --  9.4*  --   --   --   --  8.5*  HCT 31.3*  --   --   --  28.2*  --   --   --   --  25.2*  PLT 134*  --   --   --  109*  --   --   --   --  84*  HEPARINUNFRC  --   < >  --   --  0.22*  --   --   --  0.63 0.98*  CREATININE 12.32*  --   --   --   --   --  12.10* 10.35*  --  7.67*  TROPONINI  --   < > 17.40* 11.84*  --  5.01*  --   --   --   --   < > = values in this interval not displayed.  Estimated Creatinine Clearance: 13 ml/min (by C-G formula based on Cr of 7.67).   Medications:  Heparin at 2000/hr in addition to heparin with CVVH   Assessment: Heparin appears to have reached saturation kinetics with level .98 this am on 2000 units/hr. No bleeding noted however H/H have dropped.  hct by 3 gm. Will fu with rpeat cbc at next HL  Goal of Therapy:  Heparin level 0.3-0.7 units/ml Monitor platelets by anticoagulation protocol: Yes   Plan:  Hold for 45 minutes and decrease to 1700 units/hr with HL at 1300 today repeat cbc at [place] 04/25/2013,6:08 AM

## 2013-04-25 NOTE — Progress Notes (Signed)
PULMONARY / CRITICAL CARE MEDICINE  Name: John R Raczynski Sr. MRN: [id number] DOB: Jan 20, 1950    ADMISSION DATE:  04/22/2013 CONSULTATION DATE:  04/22/2013  REFERRING MD :  EDP PRIMARY SERVICE: PCCM  CHIEF COMPLAINT:  Chest pain  BRIEF PATIENT DESCRIPTION:  63 yo ESRD on HD who presented on 4/11 with chest pain and hypotension.  SIGNIFICANT EVENTS / STUDIES:  4/11  Chest CTA >>> NO dissection, NO embolus, IPF, thyroid nodules 4/11  TTE >>> EF 40% 4/12  TTE >>> LVEF 59-56%; normal diastolic function; NO pericardial effusion 4/12  CT abdomen >>> Bibasilar airspace disease, possible colitis without perforation, possible duodenitis / pancreatitis 4/13  CVVH started  LINES / TUBES: R IJ TLC 4/11 >>> L AL ??? >>> R F HD cath 4/13 >>>  CULTURES: 4/11 MRSA PCR >>> neg 4/11 Blood >>> 4/13 C.diff >>> POSITIVE  ANTIBIOTICS: Vancomycin IV 4/11>>>4/14 Ceftazidime 4/12 >>> 4/14 Flagyl IV 4/13 >>> Vancomycin PO 4/13>>  INTERVAL HISTORY: Pt feels better without any diarrhea overnight.  Denies CP.    VITAL SIGNS: Temp:  [97.4 F (36.3 C)-98.6 F (37 C)] 97.7 F (36.5 C) (04/14 0418) Pulse Rate:  [60-77] 61 (04/14 0600) Resp:  [15-31] 17 (04/14 0600) BP: (62-122)/(30-57) 88/55 mmHg (04/13 1600) SpO2:  [95 %-100 %] 100 % (04/14 0600) Arterial Line BP: (102-127)/(48-63) 110/54 mmHg (04/14 0600) Weight:  [257 lb 0.9 oz (116.6 kg)] 257 lb 0.9 oz (116.6 kg) (04/14 0500)  HEMODYNAMICS: CVP:  [9 mmHg-13 mmHg] 9 mmHg  VENTILATOR SETTINGS:   INTAKE / OUTPUT: Intake/Output     04/13 0701 - 04/14 0700 04/14 0701 - 04/15 0700   I.V. (mL/kg) 1044.8 (9)    IV Piggyback 600    Total Intake(mL/kg) 1644.8 (14.1)    Urine (mL/kg/hr) 400 (0.1)    Other 2088 (0.7)    Total Output 2488     Net -843.2          Stool Occurrence 2 x     PHYSICAL EXAMINATION: General:  Appears comfortable Neuro:  Awake, alert HEENT:  PERRL Cardiovascular:  RRR, no m/r/g Lungs:  Bilateral diminished air  entry, few rales Abdomen:  Soft, nontender, bowel sounds diminished Musculoskeletal:  Moves all extremities, no edema Skin:  Intact, AV fistula R UE  LABS:  CBC  Recent Labs Lab 04/23/13 0413 04/24/13 0500 04/25/13 0440  WBC 37.6* 38.7* 34.8*  HGB 10.2* 9.4* 8.5*  HCT 31.3* 28.2* 25.2*  PLT 134* 109* 84*   Coag's  Recent Labs Lab 04/25/13 0440  APTT >200*   BMET  Recent Labs Lab 04/24/13 0832 04/24/13 1600 04/25/13 0440  NA 136* 138 137  K 4.6 4.3 4.3  CL 93* 96 98  CO2 16* 16* 20  BUN 126* 119* 92*  CREATININE 12.10* 10.35* 7.67*  GLUCOSE 95 92 91   Electrolytes  Recent Labs Lab 04/24/13 0832 04/24/13 1600 04/25/13 0440  CALCIUM 7.6* 7.6* 8.3*  MG  --   --  2.4  PHOS 10.4* 8.5* 5.9*   Sepsis Markers  Recent Labs Lab 04/23/13 0800 04/23/13 0900 04/23/13 1400 04/24/13 0500 04/24/13 0830 04/24/13 0834  LATICACIDVEN 3.2*  --  3.3*  --  1.6  --   PROCALCITON  --  >175.00  --  >175.00  --  >175.00   ABG  Recent Labs Lab 04/23/13 2021  PHART 7.338*  PCO2ART 31.3*  PO2ART 73.0*   Liver Enzymes  Recent Labs Lab 04/22/13 1540 04/24/13 0832 04/24/13 1600 04/25/13 0440  AST 89*  --   --   --   ALT 88*  --   --   --   ALKPHOS 50  --   --   --   BILITOT 1.1  --   --   --   ALBUMIN 3.1* 2.6* 2.7* 2.5*   Cardiac Enzymes  Recent Labs Lab 04/22/13 1527  04/23/13 2019 04/24/13 0200 04/24/13 0800  TROPONINI <0.30  < > 17.40* 11.84* 5.01*  PROBNP 7085.0*  --   --   --   --   < > = values in this interval not displayed. Glucose  Recent Labs Lab 04/22/13 2129 04/24/13 2000 04/24/13 2110 04/24/13 2334 04/25/13 0413  GLUCAP 85 67* 123* 105* 95   IMAGING:  Ct Abdomen Pelvis W Contrast  04/23/2013   CLINICAL DATA:  Worsening hypovolemic shock and history of end-stage renal disease.  EXAM: CT ABDOMEN AND PELVIS WITH CONTRAST  TECHNIQUE: Multidetector CT imaging of the abdomen and pelvis was performed using the standard protocol  following bolus administration of intravenous contrast.  CONTRAST:  142mL OMNIPAQUE IOHEXOL 300 MG/ML  SOLN  COMPARISON:  DG CHEST 1V PORT dated 04/23/2013; DG CHEST 1V PORT dated 04/23/2013  FINDINGS: The visualized lung bases show dense consolidation of both lower lobes, right greater than left. There is a trace amount of right pleural fluid. Air bronchograms are present in areas of consolidation and findings are consistent with pneumonia and potentially aspiration.  The liver demonstrates cirrhotic changes without mass or biliary dilatation. There is a small rim of fluid anterior to the liver. The spleen, pancreas, adrenal glands and kidneys are unremarkable.  The colon is decompressed but demonstrates evidence of probable wall thickening and he see inflammatory change, especially at the level of the ascending colon and proximal transverse colon. Findings are suggestive of colitis. There is no evidence of free intraperitoneal air or focal abscess. Some mild edema and stranding is noted trauma to the retroperitoneum. There also is some thickening and inflammatory change involving the transverse duodenum that abuts the head of the pancreas. Correlation suggested with any possibility of pancreatitis. No peripancreatic fluid collections are identified.  No vascular abnormalities seen. No focal masses or enlarged lymph nodes are seen. The bladder is decompressed. Bony structures are unremarkable.  IMPRESSION: 1. Pulmonary consolidation with air bronchograms in both lower lobes, right greater than left. Findings are consistent with pneumonia and potentially aspiration. 2. Decompressed but thickened appearance of the colon may be consistent with colitis. There is no evidence of bowel perforation. 3. Diffuse inflammatory stranding throughout the retroperitoneum with some thickening inflammatory change involving the transverse duodenum that abuts the head of the pancreas. This may represent changes related to peptic ulcer  disease/duodenitis or pancreatitis. 4. Cirrhotic changes of the liver.   Electronically Signed   By: Aletta Edouard M.D.   On: 04/23/2013 22:38   Dg Chest Port 1 View  04/24/2013   CLINICAL DATA:  Hypoxia  EXAM: PORTABLE CHEST - 1 VIEW  COMPARISON:  DG CHEST 1V PORT dated 04/23/2013  FINDINGS: There is increased bilateral interstitial thickening and prominence of the central pulmonary vasculature. There are bilateral small pleural effusions. There is no pneumothorax. Stable cardiomegaly. Left jugular central venous catheter in unchanged position. The osseous structures are unremarkable.  IMPRESSION: Overall findings are concerning for worsening pulmonary edema.   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 P:  Cardiology following Goal MAP>60 Start Midodrine 5 Levophed gtt, titrate to off Vasopressin gtt Continue ASA d/c Heparin gtt Statin contraindicated - elevated transaminases BB / ACEI contraindicated - shock Cath lab declined by Cardiology at this time CVP  RENAL  A:  ESRD Metabolic acidosis Fluid overload P:  Renal following Trend BMET CVVHD  GASTROINTESTINAL  A:  Nutrition GI Px  Mildly elevated transaminases Pseudomembranous colitis P:  Start clear liquds Pepcid  HEMATOLOGIC  A:  Anemia, stable Heparinization for NSTEMI VTE Px P:  Trend CBC Start Heparin [place]  INFECTIOUS  A:  Sepsis likely d/t C.diff - WBC's trending down C. diff positive P:  D/c Vancomycin IV and Ceftazidime Cx / abx as  above  ENDOCRINE  A:  Presumed adrenal insufficiency P:  Stress dose steroids  NEUROLOGIC  A:  No active issues P:  Fentanyl PRN  Michail Jewels, MD IMTS, PGY-1  I have personally obtained history, examined patient, evaluated and interpreted laboratory and imaging results, reviewed medical records, formulated assessment / plan and placed orders.  CRITICAL CARE:  The patient is critically ill with multiple organ systems failure and requires high complexity decision making for assessment and support, frequent evaluation and titration of therapies, application of advanced monitoring technologies and extensive interpretation of multiple databases. Critical Care Time devoted to patient care services described in this note is 35 minutes.   Doree Fudge, MD Pulmonary and [place] Pager: [phone]  04/25/2013, 9:08 AM

## 2013-04-25 NOTE — Progress Notes (Signed)
CVVHD management.  Metabolically stable.  Still on 2 pressors.  Pulling 50-100 cc/hr.  Now dx with C Diff colitis.  Cont with CVVHD as long as requiring pressors.

## 2013-04-26 ENCOUNTER — Inpatient Hospital Stay (HOSPITAL_COMMUNITY): Payer: Medicare Other

## 2013-04-26 LAB — RENAL FUNCTION PANEL
ALBUMIN: 2.5 g/dL — AB (ref 3.5–5.2)
ALBUMIN: 2.6 g/dL — AB (ref 3.5–5.2)
BUN: 66 mg/dL — ABNORMAL HIGH (ref 6–23)
BUN: 59 mg/dL — ABNORMAL HIGH (ref 6–23)
CALCIUM: 8.8 mg/dL (ref 8.4–10.5)
CALCIUM: 8.7 mg/dL (ref 8.4–10.5)
CO2: 22 mEq/L (ref 19–32)
CO2: 22 mEq/L (ref 19–32)
Chloride: 100 mEq/L (ref 96–112)
Chloride: 100 mEq/L (ref 96–112)
Creatinine, Ser: 4.82 mg/dL — ABNORMAL HIGH (ref 0.50–1.35)
Creatinine, Ser: 4.1 mg/dL — ABNORMAL HIGH (ref 0.50–1.35)
GFR calc Af Amer: 16 mL/min — ABNORMAL LOW (ref >90)
GFR, EST AFRICAN AMERICAN: 14 mL/min — AB (ref >90)
GFR, EST NON AFRICAN AMERICAN: 12 mL/min — AB (ref >90)
GFR, EST NON AFRICAN AMERICAN: 14 mL/min — AB (ref >90)
GLUCOSE: 101 mg/dL — AB (ref 70–99)
Glucose, Bld: 87 mg/dL (ref 70–99)
PHOSPHORUS: 4.3 mg/dL (ref 2.3–4.6)
PHOSPHORUS: 3.4 mg/dL (ref 2.3–4.6)
POTASSIUM: 4.2 meq/L (ref 3.7–5.3)
Potassium: 4.2 mEq/L (ref 3.7–5.3)
SODIUM: 137 meq/L (ref 137–147)
SODIUM: 137 meq/L (ref 137–147)

## 2013-04-26 LAB — CBC
HEMATOCRIT: 24.2 % — AB (ref 39.0–52.0)
Hemoglobin: 8 g/dL — ABNORMAL LOW (ref 13.0–17.0)
MCH: 30.4 pg (ref 26.0–34.0)
MCHC: 33.1 g/dL (ref 30.0–36.0)
MCV: 92 fL (ref 78.0–100.0)
Platelets: 74 10*3/uL — ABNORMAL LOW (ref 150–400)
RBC: 2.63 MIL/uL — ABNORMAL LOW (ref 4.22–5.81)
RDW: 17.1 % — ABNORMAL HIGH (ref 11.5–15.5)
WBC: 36.1 10*3/uL — ABNORMAL HIGH (ref 4.0–10.5)

## 2013-04-26 LAB — GLUCOSE, CAPILLARY
GLUCOSE-CAPILLARY: 84 mg/dL (ref 70–99)
Glucose-Capillary: 96 mg/dL (ref 70–99)
Glucose-Capillary: 105 mg/dL — ABNORMAL HIGH (ref 70–99)

## 2013-04-26 LAB — POCT ACTIVATED CLOTTING TIME
ACTIVATED CLOTTING TIME: 165 s
ACTIVATED CLOTTING TIME: 182 s
ACTIVATED CLOTTING TIME: 193 s
ACTIVATED CLOTTING TIME: 193 s
ACTIVATED CLOTTING TIME: 199 s
ACTIVATED CLOTTING TIME: 199 s
ACTIVATED CLOTTING TIME: 204 s
Activated Clotting Time: 171 seconds
Activated Clotting Time: 171 seconds
Activated Clotting Time: 177 seconds
Activated Clotting Time: 210 seconds
Activated Clotting Time: 188 seconds
Activated Clotting Time: 188 seconds
Activated Clotting Time: 188 seconds
Activated Clotting Time: 160 seconds
Activated Clotting Time: 177 seconds

## 2013-04-26 LAB — PROCALCITONIN: Procalcitonin: 84.47 ng/mL

## 2013-04-26 LAB — APTT: aPTT: 146 seconds — ABNORMAL HIGH (ref 24–37)

## 2013-04-26 LAB — MAGNESIUM: Magnesium: 2.6 mg/dL — ABNORMAL HIGH (ref 1.5–2.5)

## 2013-04-26 MED ORDER — DARBEPOETIN ALFA-POLYSORBATE 100 MCG/0.5ML IJ SOLN
100.0000 ug | INTRAMUSCULAR | Status: DC
  Administered 2013-04-26: 100 ug via INTRAVENOUS
  Filled 2013-04-26 (×3): qty 0.5

## 2013-04-26 MED ORDER — FENTANYL CITRATE 0.05 MG/ML IJ SOLN: 25.0000 ug | INTRAMUSCULAR | Status: DC | PRN

## 2013-04-26 NOTE — Progress Notes (Signed)
PULMONARY / CRITICAL CARE MEDICINE  Name: John R Depaola Sr. MRN: [id number] DOB: 1950/08/19    ADMISSION DATE:  04/22/2013 CONSULTATION DATE:  04/22/2013  REFERRING MD :  EDP PRIMARY SERVICE: PCCM  CHIEF COMPLAINT:  Chest pain  BRIEF PATIENT DESCRIPTION:  63 yo ESRD on HD who presented on 4/11 with chest pain and hypotension.  SIGNIFICANT EVENTS / STUDIES:  4/11  Chest CTA >>> NO dissection, NO embolus, IPF, thyroid nodules 4/11  TTE >>> EF 40% 4/12  TTE >>> LVEF 57-32%; normal diastolic function; NO pericardial effusion 4/12  CT abdomen >>> Bibasilar airspace disease, possible colitis without perforation, possible duodenitis / pancreatitis 4/13  CVVH started  LINES / TUBES: R IJ TLC 4/11 >>> L AL ??? >>> R F HD cath 4/13 >>>  CULTURES: 4/11 MRSA PCR >>> neg 4/11 Blood >>> 4/13 C.diff >>> POSITIVE  ANTIBIOTICS: Vancomycin IV 4/11>>>4/14 Ceftazidime 4/12 >>> 4/14 Flagyl IV 4/13 >>> Vancomycin PO 4/13>>  INTERVAL HISTORY: No diarrhea overnight.   Denies CP.   Afebrile  VITAL SIGNS: Temp:  [95.5 F (35.3 C)-97.9 F (36.6 C)] 97.9 F (36.6 C) (04/15 0350) Pulse Rate:  [45-68] 59 (04/15 0700) Resp:  [11-25] 24 (04/15 0700) BP: (81-101)/(43-80) 87/51 mmHg (04/15 0700) SpO2:  [93 %-100 %] 98 % (04/15 0700) Arterial Line BP: (93-125)/(38-71) 102/49 mmHg (04/15 0700) Weight:  [116.4 kg (256 lb 9.9 oz)] 116.4 kg (256 lb 9.9 oz) (04/15 0500)  HEMODYNAMICS: CVP:  [6 mmHg-17 mmHg] 13 mmHg  VENTILATOR SETTINGS:   INTAKE / OUTPUT: Intake/Output     04/14 0701 - 04/15 0700 04/15 0701 - 04/16 0700   I.V. (mL/kg) 739.3 (6.4)    IV Piggyback 350    Total Intake(mL/kg) 1089.3 (9.4)    Urine (mL/kg/hr) 300 (0.1)    Other 2397 (0.9)    Stool 1 (0)    Total Output 2698     Net -1608.7          Stool Occurrence 1 x     PHYSICAL EXAMINATION: General:  Appears comfortable, supine Neuro:  Awake, alert, interactive HEENT:  PERRL Cardiovascular:  RRR, no m/r/g Lungs:   Bilateral diminished air entry, few rales Abdomen:  Soft, nontender, bowel sounds diminished Musculoskeletal:  Moves all extremities, no edema Skin:  Intact, AV fistula R UE  LABS:  CBC  Recent Labs Lab 04/24/13 0500 04/25/13 0440 04/26/13 0400  WBC 38.7* 34.8* 36.1*  HGB 9.4* 8.5* 8.0*  HCT 28.2* 25.2* 24.2*  PLT 109* 84* 74*   Coag's  Recent Labs Lab 04/25/13 0440 04/26/13 0400  APTT >200* 146*   BMET  Recent Labs Lab 04/25/13 0440 04/25/13 1600 04/26/13 0400  NA 137 137 137  K 4.3 4.4 4.2  CL 98 99 100  CO2 20 21 22   BUN 92* 76* 66*  CREATININE 7.67* 5.80* 4.82*  GLUCOSE 91 92 87   Electrolytes  Recent Labs Lab 04/25/13 0440 04/25/13 1600 04/26/13 0400  CALCIUM 8.3* 8.9 8.8  MG 2.4  --  2.6*  PHOS 5.9* 4.7* 4.3   Sepsis Markers  Recent Labs Lab 04/23/13 0800  04/23/13 1400  04/24/13 0830 04/24/13 0834 04/25/13 0440 04/26/13 0400  LATICACIDVEN 3.2*  --  3.3*  --  1.6  --   --   --   PROCALCITON  --   < >  --   < >  --  >175.00 >175.00 84.47  < > = values in this interval not displayed. ABG  Recent Labs  Lab 04/23/13 2021  PHART 7.338*  PCO2ART 31.3*  PO2ART 73.0*   Liver Enzymes  Recent Labs Lab 04/22/13 1540  04/25/13 0440 04/25/13 1600 04/26/13 0400  AST 89*  --   --   --   --   ALT 88*  --   --   --   --   ALKPHOS 50  --   --   --   --   BILITOT 1.1  --   --   --   --   ALBUMIN 3.1*  < > 2.5* 2.6* 2.5*  < > = values in this interval not displayed. Cardiac Enzymes  Recent Labs Lab 04/22/13 1527  04/23/13 2019 04/24/13 0200 04/24/13 0800  TROPONINI <0.30  < > 17.40* 11.84* 5.01*  PROBNP 7085.0*  --   --   --   --   < > = values in this interval not displayed. Glucose  Recent Labs Lab 04/25/13 0759 04/25/13 1249 04/25/13 1519 04/25/13 1919 04/25/13 2354 04/26/13 0344  GLUCAP 86 77 81 85 88 84   IMAGING:  Dg Chest Port 1 View  04/26/2013   CLINICAL DATA:  Respiratory failure  EXAM: PORTABLE CHEST - 1  VIEW  COMPARISON:  April 25, 2013  FINDINGS: Central catheter tip is in the superior vena cava. No pneumothorax. There is mild interstitial edema with small pleural effusions bilaterally. There is moderate cardiomegaly with pulmonary venous hypertension. There is patchy atelectasis in the left base. No adenopathy.  IMPRESSION: Evidence of a degree of congestive heart failure. Patchy atelectasis left base. No pneumothorax.   Electronically Signed   By: Lowella Grip M.D.   On: 04/26/2013 07:05   Dg Chest Port 1 View  04/25/2013   CLINICAL DATA:  Suspect acute pulmonary edema  EXAM: PORTABLE CHEST - 1 VIEW  COMPARISON:  DG CHEST 1V PORT dated 04/24/2013  FINDINGS: The lungs are adequately inflated. The interstitial markings remain increased. The pulmonary vascularity is prominent and has increased slightly in conspicuity on the left since yesterday's study. The cardiopericardial silhouette is enlarged. The right internal jugular venous catheter tip lies in the region of the junction of the right and left brachiocephalic veins. Small amounts of pleural fluid blunting the costophrenic angles.  IMPRESSION: The appearance of the thorax is consistent with the clinically suspected pulmonary edema. There has been slight interval deterioration since yesterday's study. Small bilateral pleural effusions are present. These results were called by telephone at the time of interpretation on 04/25/2013 at 9:58 AM to Audie Pinto, RN,, who verbally acknowledged these results.   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 P:  Cardiology following Goal  MAP>60 Ct  Midodrine 5 Levophed gtt, titrate to off Vasopressin gtt Continue ASA d/c Heparin gtt Statin contraindicated - elevated transaminases BB / ACEI contraindicated - shock Cath lab declined by Cardiology at this time, prob needs study at some point   RENAL  A:  ESRD Metabolic acidosis Fluid overload P:  Renal following Trend BMET CVVHD -transition to iHD once pressors off  GASTROINTESTINAL  A:  Nutrition GI Px  Mildly elevated transaminases Pseudomembranous colitis P:  Advance PO Pepcid  HEMATOLOGIC  A:  Anemia, stable Heparinization for NSTEMI VTE Px P:  Trend CBC Start Heparin [place]  INFECTIOUS  A:  Sepsis likely d/t C.diff - WBC's trending down C. diff positive P:  D/c Vancomycin IV and Ceftazidime PO vanc & Flagyl  ENDOCRINE  A:  Presumed adrenal insufficiency P:  Stress dose steroids -can taper once off pressors  NEUROLOGIC  A:  No active issues P:  Fentanyl PRN  I have personally obtained history, examined patient, evaluated and interpreted laboratory and imaging results, reviewed medical records, formulated assessment / plan and placed orders.  CRITICAL CARE:  The patient is critically ill with multiple organ systems failure and requires high complexity decision making for assessment and support, frequent evaluation and titration of therapies, application of advanced monitoring technologies and extensive interpretation of multiple databases. Critical Care Time devoted to patient care services described in this note is 32 minutes.   Rigoberto Noel, MD Pulmonary and [place] Pager: [phone]  04/26/2013, 7:30 AM

## 2013-04-26 NOTE — Progress Notes (Signed)
CVVHD note.  Metabolically stable.  CXR improving. Still on pressors with low BP.  Feels well, mild diarrhea.  Will start aranesp.

## 2013-04-26 NOTE — Progress Notes (Signed)
Patient Name: John R Pugmire Sr. Date of Encounter: 04/26/2013     Principal Problem:   C. difficile colitis Active Problems:   CKD (chronic kidney disease) stage V requiring chronic dialysis   Septic shock(785.52)   Severe sepsis(995.92)    SUBJECTIVE  63 yo ESRD on HD who presented on 4/11 with chest pain and hypotension.   Feeling much better today   CURRENT MEDS . aspirin  325 mg Oral Daily  . darbepoetin (ARANESP) injection - DIALYSIS  100 mcg Intravenous Q Wed-HD  . famotidine (PEPCID) IV  20 mg Intravenous Q24H  . heparin subcutaneous  5,000 Units Subcutaneous 3 times per day  . hydrocortisone sod succinate (SOLU-CORTEF) inj  50 mg Intravenous Q6H  . vancomycin  500 mg Oral 4 times per day   And  . metronidazole  500 mg Intravenous Q8H  . midodrine  5 mg Oral TID WC  . rOPINIRole  1 mg Oral QHS    OBJECTIVE  Filed Vitals:   04/26/13 1030 04/26/13 1100 04/26/13 1130 04/26/13 1200  BP: 81/31 79/49 90/47  85/50  Pulse: 52 51 54 51  Temp:      TempSrc:      Resp: 12 14 16 18   Height:      Weight:      SpO2: 100% 98% 97% 99%    Intake/Output Summary (Last 24 hours) at 04/26/13 1216 Last data filed at 04/26/13 1200  Gross per 24 hour  Intake 1066.3 ml  Output   2784 ml  Net -1717.7 ml   Filed Weights   04/24/13 0405 04/25/13 0500 04/26/13 0500  Weight: 264 lb 15.9 oz (120.2 kg) 257 lb 0.9 oz (116.6 kg) 256 lb 9.9 oz (116.4 kg)    PHYSICAL EXAM  General: Pleasant, NAD. Neuro: Alert and oriented X 3. Moves all extremities spontaneously. Psych: Normal affect. HEENT:  Normal  Neck: Supple without bruits or JVD. Lungs:  Resp regular and unlabored, CTA. Heart: RRR no s3, s4, or murmurs. Abdomen: Soft, non-tender, non-distended, BS + x 4.  Extremities: No clubbing, cyanosis or edema. DP/PT/Radials 2+ and equal bilaterally.  Accessory Clinical Findings  CBC  Recent Labs  04/25/13 0440 04/26/13 0400  WBC 34.8* 36.1*  HGB 8.5* 8.0*  HCT 25.2*  24.2*  MCV 90.0 92.0  PLT 84* 74*   Basic Metabolic Panel  Recent Labs  04/25/13 0440 04/25/13 1600 04/26/13 0400  NA 137 137 137  K 4.3 4.4 4.2  CL 98 99 100  CO2 20 21 22   GLUCOSE 91 92 87  BUN 92* 76* 66*  CREATININE 7.67* 5.80* 4.82*  CALCIUM 8.3* 8.9 8.8  MG 2.4  --  2.6*  PHOS 5.9* 4.7* 4.3   Liver Function Tests  Recent Labs  04/25/13 1600 04/26/13 0400  ALBUMIN 2.6* 2.5*    Cardiac Enzymes  Recent Labs  04/23/13 2019 04/24/13 0200 04/24/13 0800  TROPONINI 17.40* 11.84* 5.01*    Radiology/Studies  Ct Abdomen Pelvis W Contrast  04/23/2013   CLINICAL DATA:  Worsening hypovolemic shock and history of end-stage renal disease.  EXAM: CT ABDOMEN AND PELVIS WITH CONTRAST  TECHNIQUE: Multidetector CT imaging of the abdomen and pelvis was performed using the standard protocol following bolus administration of intravenous contrast.  CONTRAST:  154mL OMNIPAQUE IOHEXOL 300 MG/ML  SOLN  COMPARISON:  DG CHEST 1V PORT dated 04/23/2013; DG CHEST 1V PORT dated 04/23/2013  FINDINGS: The visualized lung bases show dense consolidation of both lower lobes, right greater than  left. There is a trace amount of right pleural fluid. Air bronchograms are present in areas of consolidation and findings are consistent with pneumonia and potentially aspiration.  The liver demonstrates cirrhotic changes without mass or biliary dilatation. There is a small rim of fluid anterior to the liver. The spleen, pancreas, adrenal glands and kidneys are unremarkable.  The colon is decompressed but demonstrates evidence of probable wall thickening and he see inflammatory change, especially at the level of the ascending colon and proximal transverse colon. Findings are suggestive of colitis. There is no evidence of free intraperitoneal air or focal abscess. Some mild edema and stranding is noted trauma to the retroperitoneum. There also is some thickening and inflammatory change involving the transverse duodenum  that abuts the head of the pancreas. Correlation suggested with any possibility of pancreatitis. No peripancreatic fluid collections are identified.  No vascular abnormalities seen. No focal masses or enlarged lymph nodes are seen. The bladder is decompressed. Bony structures are unremarkable.  IMPRESSION: 1. Pulmonary consolidation with air bronchograms in both lower lobes, right greater than left. Findings are consistent with pneumonia and potentially aspiration. 2. Decompressed but thickened appearance of the colon may be consistent with colitis. There is no evidence of bowel perforation. 3. Diffuse inflammatory stranding throughout the retroperitoneum with some thickening inflammatory change involving the transverse duodenum that abuts the head of the pancreas. This may represent changes related to peptic ulcer disease/duodenitis or pancreatitis. 4. Cirrhotic changes of the liver.   Dg Chest Port 1 View  04/26/2013   CLINICAL DATA:  Respiratory failure  EXAM: PORTABLE CHEST - 1 VIEW  COMPARISON:  April 25, 2013  FINDINGS: Central catheter tip is in the superior vena cava. No pneumothorax. There is mild interstitial edema with small pleural effusions bilaterally. There is moderate cardiomegaly with pulmonary venous hypertension. There is patchy atelectasis in the left base. No adenopathy.  IMPRESSION: Evidence of a degree of congestive heart failure. Patchy atelectasis left base. No pneumothorax.      ASSESSMENT AND PLAN 63 yo ESRD on HD who presented on 4/11 with chest pain and hypotension.  Hypoxemic respiratory failure- acute pulmonary edema, improved  -- Suspect bilateral pleural effusions R>L  Septic shock -sepsis likely d/t C.diff - WBC's trending down  --Currently on Levophed/Vasopressin for pressor support. His LVF is normal and therefore this is unlikely to be due to cardiogenic etiology.   NSTEMI - He probably has underlying CAD - cardiac enzymes went quite high (peak 11.84) to just be  from demand ischemia. Suspected to be demand ischemia in the setting of severe hypotension and significant CAD.  -- No indication for cath at this time due to lack of ST elevation on EKG, normal LVF with no wall motion abnormality on echo in setting of active sepsis with elevated WBC which continues to remain quite elevated (36K today) --May want to consider RHC to assess hemodynamics especially to evaluate SVR and if low then this is primarily septic shock.  -- Statin contraindicated - elevated transaminases; BB / ACEI contraindicated - shock  -- heparin gtt d/c'd and now on SQ injections  ESRD Metabolic acidosis and fluid overload  -- Renal following  -- CVVHD -transition to iHD once pressors off   GASTROINTESTINAL- Pseudomembranous colitis. C. diff positive  -- Mildly elevated transaminases  -- PO vanc & Flagyl   -- Advanced to PO diet today   Presumed adrenal insufficiency- stress dose steroids -can taper once off pressors -- per critical care  Anemia- stable  Signed, Perry Mount PA-C  Pager [phone]  Patient examined chart reviewed Being dialyzed from RFV catheter to avoid infection of RUE fistula  NO chest pain  BP ok on dialysis  No chest pain Troponin has come done Agree with need for cath at some point ? Monday    Josue Hector

## 2013-04-27 LAB — POCT ACTIVATED CLOTTING TIME
ACTIVATED CLOTTING TIME: 237 s
ACTIVATED CLOTTING TIME: 215 s
ACTIVATED CLOTTING TIME: 215 s
Activated Clotting Time: 177 seconds
Activated Clotting Time: 177 seconds
Activated Clotting Time: 182 seconds
Activated Clotting Time: 188 seconds
Activated Clotting Time: 221 seconds
Activated Clotting Time: 238 seconds

## 2013-04-27 LAB — GLUCOSE, CAPILLARY
GLUCOSE-CAPILLARY: 72 mg/dL (ref 70–99)
GLUCOSE-CAPILLARY: 76 mg/dL (ref 70–99)
Glucose-Capillary: 101 mg/dL — ABNORMAL HIGH (ref 70–99)
Glucose-Capillary: 102 mg/dL — ABNORMAL HIGH (ref 70–99)
Glucose-Capillary: 65 mg/dL — ABNORMAL LOW (ref 70–99)
Glucose-Capillary: 73 mg/dL (ref 70–99)
Glucose-Capillary: 95 mg/dL (ref 70–99)

## 2013-04-27 LAB — RENAL FUNCTION PANEL
Albumin: 2.7 g/dL — ABNORMAL LOW (ref 3.5–5.2)
Albumin: 2.8 g/dL — ABNORMAL LOW (ref 3.5–5.2)
BUN: 53 mg/dL — ABNORMAL HIGH (ref 6–23)
BUN: 49 mg/dL — ABNORMAL HIGH (ref 6–23)
CHLORIDE: 98 meq/L (ref 96–112)
CO2: 22 meq/L (ref 19–32)
CO2: 21 meq/L (ref 19–32)
CREATININE: 3.6 mg/dL — AB (ref 0.50–1.35)
CREATININE: 3.36 mg/dL — AB (ref 0.50–1.35)
Calcium: 9 mg/dL (ref 8.4–10.5)
Calcium: 8.8 mg/dL (ref 8.4–10.5)
Chloride: 102 mEq/L (ref 96–112)
GFR calc Af Amer: 19 mL/min — ABNORMAL LOW (ref >90)
GFR calc Af Amer: 21 mL/min — ABNORMAL LOW (ref >90)
GFR, EST NON AFRICAN AMERICAN: 17 mL/min — AB (ref >90)
GFR, EST NON AFRICAN AMERICAN: 18 mL/min — AB (ref >90)
GLUCOSE: 89 mg/dL (ref 70–99)
Glucose, Bld: 83 mg/dL (ref 70–99)
Phosphorus: 3.5 mg/dL (ref 2.3–4.6)
Phosphorus: 3.2 mg/dL (ref 2.3–4.6)
Potassium: 4.4 mEq/L (ref 3.7–5.3)
Potassium: 4.3 mEq/L (ref 3.7–5.3)
Sodium: 137 mEq/L (ref 137–147)
Sodium: 136 mEq/L — ABNORMAL LOW (ref 137–147)

## 2013-04-27 LAB — CBC
HEMATOCRIT: 25.6 % — AB (ref 39.0–52.0)
Hemoglobin: 8.2 g/dL — ABNORMAL LOW (ref 13.0–17.0)
MCH: 29.9 pg (ref 26.0–34.0)
MCHC: 32 g/dL (ref 30.0–36.0)
MCV: 93.4 fL (ref 78.0–100.0)
Platelets: 79 10*3/uL — ABNORMAL LOW (ref 150–400)
RBC: 2.74 MIL/uL — ABNORMAL LOW (ref 4.22–5.81)
RDW: 17.3 % — ABNORMAL HIGH (ref 11.5–15.5)
WBC: 31 10*3/uL — ABNORMAL HIGH (ref 4.0–10.5)

## 2013-04-27 LAB — MAGNESIUM: Magnesium: 2.6 mg/dL — ABNORMAL HIGH (ref 1.5–2.5)

## 2013-04-27 LAB — APTT: aPTT: 195 seconds — ABNORMAL HIGH (ref 24–37)

## 2013-04-27 MED ORDER — HYDROCORTISONE NA SUCCINATE PF 100 MG IJ SOLR
25.0000 mg | Freq: Two times a day (BID) | INTRAMUSCULAR | Status: DC
  Administered 2013-04-27 – 2013-04-28 (×2): 25 mg via INTRAVENOUS
  Filled 2013-04-27 (×4): qty 0.5

## 2013-04-27 MED ORDER — FENTANYL CITRATE 0.05 MG/ML IJ SOLN: 25.0000 ug | INTRAMUSCULAR | Status: DC | PRN

## 2013-04-27 MED ORDER — MIDODRINE HCL 5 MG PO TABS
10.0000 mg | ORAL_TABLET | Freq: Three times a day (TID) | ORAL | Status: DC
  Administered 2013-04-27 – 2013-05-01 (×13): 10 mg via ORAL
  Filled 2013-04-27 (×19): qty 2

## 2013-04-27 MED ORDER — HYDROCORTISONE NA SUCCINATE PF 100 MG IJ SOLR
50.0000 mg | Freq: Two times a day (BID) | INTRAMUSCULAR | Status: DC
  Filled 2013-04-27 (×2): qty 1

## 2013-04-27 NOTE — Progress Notes (Signed)
PULMONARY / CRITICAL CARE MEDICINE  Name: John R Sokolow Sr. MRN: [id number] DOB: Jan 22, 1950    ADMISSION DATE:  04/22/2013 CONSULTATION DATE:  04/22/2013  REFERRING MD :  EDP PRIMARY SERVICE: PCCM  CHIEF COMPLAINT:  Chest pain  BRIEF PATIENT DESCRIPTION:  63 yo ESRD on HD who presented on 4/11 with chest pain and hypotension.  SIGNIFICANT EVENTS / STUDIES:  4/11  Chest CTA >>> NO dissection, NO embolus, IPF, thyroid nodules 4/11  TTE >>> EF 40% 4/12  TTE >>> LVEF 88-41%; normal diastolic function; NO pericardial effusion 4/12  CT abdomen >>> Bibasilar airspace disease, possible colitis without perforation, possible duodenitis / pancreatitis 4/13  CVVH started 4/15 vasopressin dc'd   LINES / TUBES: R IJ TLC 4/11 >>> L AL ??? >>> R F HD cath 4/13 >>>  CULTURES: 4/11 MRSA PCR >>> neg 4/11 Blood >>> 4/13 C.diff >>> POSITIVE  ANTIBIOTICS: Vancomycin IV 4/11>>>4/14 Ceftazidime 4/12 >>> 4/14 Flagyl IV 4/13 >>> Vancomycin PO 4/13>>  INTERVAL HISTORY: No diarrhea overnight.   Looks good, bp 660 systolic  VITAL SIGNS: Temp:  [96.7 F (35.9 C)-97.5 F (36.4 C)] 97.4 F (36.3 C) (04/16 0743) Pulse Rate:  [47-120] 120 (04/16 0900) Resp:  [9-23] 17 (04/16 0900) BP: (72-114)/(29-73) 93/51 mmHg (04/16 0800) SpO2:  [37 %-100 %] 37 % (04/16 0900) Arterial Line BP: (88-151)/(45-64) 101/58 mmHg (04/16 0900) Weight:  [115.6 kg (254 lb 13.6 oz)] 115.6 kg (254 lb 13.6 oz) (04/16 0500)  HEMODYNAMICS: CVP:  [11 mmHg-25 mmHg] 23 mmHg  VENTILATOR SETTINGS:   INTAKE / OUTPUT: Intake/Output     04/15 0701 - 04/16 0700 04/16 0701 - 04/17 0700   P.O. 30    I.V. (mL/kg) 654.4 (5.7) 32 (0.3)   IV Piggyback 350 100   Total Intake(mL/kg) 1034.4 (8.9) 132 (1.1)   Urine (mL/kg/hr) 120 (0)    Other 2271 (0.8) 237 (0.6)   Stool  1 (0)   Total Output 2391 238   Net -1356.6 -106        Stool Occurrence  1 x    PHYSICAL EXAMINATION: General:  Appears comfortable, supine Neuro:  Awake,  alert, interactive HEENT:  PERRL Cardiovascular:  RRR, no m/r/g Lungs:  Bilateral diminished air entry, few rales Abdomen:  Soft, nontender, bowel sounds diminished Musculoskeletal:  Moves all extremities, no edema Skin:  Intact, AV fistula R UE  LABS:  CBC  Recent Labs Lab 04/25/13 0440 04/26/13 0400 04/27/13 0410  WBC 34.8* 36.1* 31.0*  HGB 8.5* 8.0* 8.2*  HCT 25.2* 24.2* 25.6*  PLT 84* 74* 79*   Coag's  Recent Labs Lab 04/25/13 0440 04/26/13 0400 04/27/13 0410  APTT >200* 146* 195*   BMET  Recent Labs Lab 04/26/13 0400 04/26/13 1504 04/27/13 0410  NA 137 137 137  K 4.2 4.2 4.4  CL 100 100 102  CO2 22 22 22   BUN 66* 59* 53*  CREATININE 4.82* 4.10* 3.60*  GLUCOSE 87 101* 89   Electrolytes  Recent Labs Lab 04/25/13 0440  04/26/13 0400 04/26/13 1504 04/27/13 0410  CALCIUM 8.3*  < > 8.8 8.7 9.0  MG 2.4  --  2.6*  --  2.6*  PHOS 5.9*  < > 4.3 3.4 3.5  < > = values in this interval not displayed. Sepsis Markers  Recent Labs Lab 04/23/13 0800  04/23/13 1400  04/24/13 0830 04/24/13 0834 04/25/13 0440 04/26/13 0400  LATICACIDVEN 3.2*  --  3.3*  --  1.6  --   --   --  PROCALCITON  --   < >  --   < >  --  >175.00 >175.00 84.47  < > = values in this interval not displayed. ABG  Recent Labs Lab 04/23/13 2021  PHART 7.338*  PCO2ART 31.3*  PO2ART 73.0*   Liver Enzymes  Recent Labs Lab 04/22/13 1540  04/26/13 0400 04/26/13 1504 04/27/13 0410  AST 89*  --   --   --   --   ALT 88*  --   --   --   --   ALKPHOS 50  --   --   --   --   BILITOT 1.1  --   --   --   --   ALBUMIN 3.1*  < > 2.5* 2.6* 2.7*  < > = values in this interval not displayed. Cardiac Enzymes  Recent Labs Lab 04/22/13 1527  04/23/13 2019 04/24/13 0200 04/24/13 0800  TROPONINI <0.30  < > 17.40* 11.84* 5.01*  PROBNP 7085.0*  --   --   --   --   < > = values in this interval not displayed. Glucose  Recent Labs Lab 04/26/13 1216 04/26/13 1557 04/26/13 1942  04/26/13 2358 04/27/13 0352 04/27/13 0738  GLUCAP 102* 96 105* 101* 73 72   IMAGING:  Dg Chest Port 1 View  04/26/2013   CLINICAL DATA:  Respiratory failure  EXAM: PORTABLE CHEST - 1 VIEW  COMPARISON:  April 25, 2013  FINDINGS: Central catheter tip is in the superior vena cava. No pneumothorax. There is mild interstitial edema with small pleural effusions bilaterally. There is moderate cardiomegaly with pulmonary venous hypertension. There is patchy atelectasis in the left base. No adenopathy.  IMPRESSION: Evidence of a degree of congestive heart failure. Patchy atelectasis left base. No pneumothorax.   Electronically Signed   By: Lowella Grip M.D.   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 P:  Cardiology following Goal MAP 55 mmHg Midodrine increased 4/16 by Renal DC vasopressin 4/16 Continue ASA d/c Heparin gtt Statin contraindicated - elevated transaminases BB / ACEI contraindicated - shock Cath lab declined by Cardiology at this time, prob needs study at some point   RENAL  A:  ESRD Metabolic acidosis Fluid overload P:  Renal following Trend BMET CVVHD -transition to iHD once pressors off  GASTROINTESTINAL  A:  Nutrition GI Px  Mildly elevated transaminases Pseudomembranous colitis P:  Advance PO Pepcid  HEMATOLOGIC  A:  Anemia, stable Heparinization for NSTEMI VTE Px P:  Trend CBC Start Heparin [place]  INFECTIOUS  A:  Sepsis likely d/t C.diff - WBC's trending down C. diff positive P:  D/c Vancomycin IV and Ceftazidime PO vanc & Flagyl  ENDOCRINE  A:  Presumed adrenal insufficiency P:  Stress dose steroids -can taper  once off pressors  NEUROLOGIC  A:  No active issues P:  Fentanyl PRN    Richardson Landry Minor ACNP Maryanna Shape PCCM Pager [phone] till 3 pm If no answer page [phone] 04/27/2013, 10:25 AM   PCCM ATTENDING: I have interviewed and examined the patient and reviewed the database. I have formulated the assessment and plan as reflected in the note above with amendments made by me.   Merton Border, MD;  PCCM service;  Mobile [phone]

## 2013-04-27 NOTE — Progress Notes (Signed)
Patient ID: [name], male   DOB: 02-17-1950, 63 y.o.   MRN: [id number]   Patient Name: John R Abramo Sr. Date of Encounter: 04/27/2013     Principal Problem:   C. difficile colitis Active Problems:   CKD (chronic kidney disease) stage V requiring chronic dialysis   Septic shock(785.52)   Severe sepsis(995.92)    SUBJECTIVE  63 yo ESRD on HD who presented on 4/11 with chest pain and hypotension.   Feeling much better today   CURRENT MEDS . aspirin  325 mg Oral Daily  . darbepoetin (ARANESP) injection - DIALYSIS  100 mcg Intravenous Q Wed-HD  . famotidine (PEPCID) IV  20 mg Intravenous Q24H  . heparin subcutaneous  5,000 Units Subcutaneous 3 times per day  . hydrocortisone sod succinate (SOLU-CORTEF) inj  50 mg Intravenous Q6H  . vancomycin  500 mg Oral 4 times per day   And  . metronidazole  500 mg Intravenous Q8H  . midodrine  5 mg Oral TID WC  . rOPINIRole  1 mg Oral QHS   . dextrose 20 mL/hr at 04/27/13 0400  . heparin 10,000 units/ 20 mL infusion syringe 2,400 Units/hr (04/27/13 0812)  . norepinephrine (LEVOPHED) Adult infusion Stopped (04/27/13 0000)  . dialysis replacement fluid (prismasate) 300 mL/hr at 04/26/13 1815  . dialysis replacement fluid (prismasate) 200 mL/hr at 04/25/13 1640  . dialysate (PRISMASATE) 1,500 mL/hr at 04/27/13 0224  . vasopressin (PITRESSIN) infusion - *FOR SHOCK* 0.03 Units/min (04/27/13 0400)   OBJECTIVE  Filed Vitals:   04/27/13 0500 04/27/13 0600 04/27/13 0700 04/27/13 0743  BP: 86/48 102/60 89/57   Pulse: 48 56 55   Temp:    97.4 F (36.3 C)  TempSrc:    Oral  Resp: 18 19 21    Height:      Weight: 254 lb 13.6 oz (115.6 kg)     SpO2: 96% 99% 99%     Intake/Output Summary (Last 24 hours) at 04/27/13 0812 Last data filed at 04/27/13 0700  Gross per 24 hour  Intake  923.5 ml  Output   2304 ml  Net -1380.5 ml   Filed Weights   04/25/13 0500 04/26/13 0500 04/27/13 0500  Weight: 257 lb 0.9 oz (116.6 kg) 256 lb 9.9  oz (116.4 kg) 254 lb 13.6 oz (115.6 kg)    PHYSICAL EXAM  General: Pleasant, NAD. Neuro: Alert and oriented X 3. Moves all extremities spontaneously. Psych: Normal affect. HEENT:  Normal  Neck: Supple without bruits or JVD. Lungs:  Resp regular and unlabored, CTA. Heart: RRR no s3, s4, or murmurs. Abdomen: Soft, non-tender, non-distended, BS + x 4.  Extremities: No clubbing, cyanosis or edema. DP/PT/Radials 2+ and equal bilaterally.  Accessory Clinical Findings  CBC  Recent Labs  04/26/13 0400 04/27/13 0410  WBC 36.1* 31.0*  HGB 8.0* 8.2*  HCT 24.2* 25.6*  MCV 92.0 93.4  PLT 74* 79*   Basic Metabolic Panel  Recent Labs  04/26/13 0400 04/26/13 1504 04/27/13 0410  NA 137 137 137  K 4.2 4.2 4.4  CL 100 100 102  CO2 22 22 22   GLUCOSE 87 101* 89  BUN 66* 59* 53*  CREATININE 4.82* 4.10* 3.60*  CALCIUM 8.8 8.7 9.0  MG 2.6*  --  2.6*  PHOS 4.3 3.4 3.5   Liver Function Tests  Recent Labs  04/26/13 1504 04/27/13 0410  ALBUMIN 2.6* 2.7*    Cardiac Enzymes No results found for this basename: CKTOTAL, CKMB, CKMBINDEX, TROPONINI,  in  the last 72 hours  Radiology/Studies  Ct Abdomen Pelvis W Contrast  04/23/2013   CLINICAL DATA:  Worsening hypovolemic shock and history of end-stage renal disease.  EXAM: CT ABDOMEN AND PELVIS WITH CONTRAST  TECHNIQUE: Multidetector CT imaging of the abdomen and pelvis was performed using the standard protocol following bolus administration of intravenous contrast.  CONTRAST:  125mL OMNIPAQUE IOHEXOL 300 MG/ML  SOLN  COMPARISON:  DG CHEST 1V PORT dated 04/23/2013; DG CHEST 1V PORT dated 04/23/2013  FINDINGS: The visualized lung bases show dense consolidation of both lower lobes, right greater than left. There is a trace amount of right pleural fluid. Air bronchograms are present in areas of consolidation and findings are consistent with pneumonia and potentially aspiration.  The liver demonstrates cirrhotic changes without mass or biliary  dilatation. There is a small rim of fluid anterior to the liver. The spleen, pancreas, adrenal glands and kidneys are unremarkable.  The colon is decompressed but demonstrates evidence of probable wall thickening and he see inflammatory change, especially at the level of the ascending colon and proximal transverse colon. Findings are suggestive of colitis. There is no evidence of free intraperitoneal air or focal abscess. Some mild edema and stranding is noted trauma to the retroperitoneum. There also is some thickening and inflammatory change involving the transverse duodenum that abuts the head of the pancreas. Correlation suggested with any possibility of pancreatitis. No peripancreatic fluid collections are identified.  No vascular abnormalities seen. No focal masses or enlarged lymph nodes are seen. The bladder is decompressed. Bony structures are unremarkable.  IMPRESSION: 1. Pulmonary consolidation with air bronchograms in both lower lobes, right greater than left. Findings are consistent with pneumonia and potentially aspiration. 2. Decompressed but thickened appearance of the colon may be consistent with colitis. There is no evidence of bowel perforation. 3. Diffuse inflammatory stranding throughout the retroperitoneum with some thickening inflammatory change involving the transverse duodenum that abuts the head of the pancreas. This may represent changes related to peptic ulcer disease/duodenitis or pancreatitis. 4. Cirrhotic changes of the liver.   Dg Chest Port 1 View  04/26/2013   CLINICAL DATA:  Respiratory failure  EXAM: PORTABLE CHEST - 1 VIEW  COMPARISON:  April 25, 2013  FINDINGS: Central catheter tip is in the superior vena cava. No pneumothorax. There is mild interstitial edema with small pleural effusions bilaterally. There is moderate cardiomegaly with pulmonary venous hypertension. There is patchy atelectasis in the left base. No adenopathy.  IMPRESSION: Evidence of a degree of congestive  heart failure. Patchy atelectasis left base. No pneumothorax.      ASSESSMENT AND PLAN 63 yo ESRD on HD who presented on 4/11 with chest pain and hypotension.  Hypoxemic respiratory failure- acute pulmonary edema, improved  -- Suspect bilateral pleural effusions R>L  Septic shock -sepsis likely d/t C.diff - WBC's trending down  --trying to wean vasopressin increase midodrine to 10 tid   NSTEMI - He probably has underlying CAD - cardiac enzymes went quite high (peak 11.84) to just be from demand ischemia. Suspected to be demand ischemia in the setting of severe hypotension and significant CAD.  Cath pending improved medical status.  Needs to be off pressors and preferably Rx infection with d/c of RFV dialysis catheter.  Hemodynamics improving no chest pain Tentative cath on Monday   ESRD Metabolic acidosis and fluid overload  -- Renal following  -- CVVHD -transition to iHD once pressors off   GASTROINTESTINAL- Pseudomembranous colitis. C. diff positive  -- Mildly elevated transaminases  --  PO vanc & Flagyl   -- Advanced to PO diet today   Presumed adrenal insufficiency- stress dose steroids -can taper once off pressors -- per critical care  Anemia- stable   Signed, Josue Hector PA-C

## 2013-04-27 NOTE — Progress Notes (Signed)
CVVHD management.  Metabolically stable.  Levophed off.  Still on vasopressin.  Realistically, I see cont CVVHD thru Sat and then DC if off pressors and attempt IHD next week.  Increase midodrine to 10mg  tid.  Recheck CXR sat

## 2013-04-28 ENCOUNTER — Inpatient Hospital Stay (HOSPITAL_COMMUNITY): Payer: Medicare Other

## 2013-04-28 DIAGNOSIS — R6520 Severe sepsis without septic shock: Secondary | ICD-10-CM

## 2013-04-28 DIAGNOSIS — A419 Sepsis, unspecified organism: Secondary | ICD-10-CM

## 2013-04-28 LAB — RENAL FUNCTION PANEL
Albumin: 2.5 g/dL — ABNORMAL LOW (ref 3.5–5.2)
Albumin: 2.4 g/dL — ABNORMAL LOW (ref 3.5–5.2)
BUN: 44 mg/dL — AB (ref 6–23)
BUN: 41 mg/dL — ABNORMAL HIGH (ref 6–23)
CALCIUM: 8.8 mg/dL (ref 8.4–10.5)
CO2: 23 mEq/L (ref 19–32)
CO2: 24 meq/L (ref 19–32)
CREATININE: 2.99 mg/dL — AB (ref 0.50–1.35)
Calcium: 8.4 mg/dL (ref 8.4–10.5)
Chloride: 103 mEq/L (ref 96–112)
Chloride: 103 mEq/L (ref 96–112)
Creatinine, Ser: 3.05 mg/dL — ABNORMAL HIGH (ref 0.50–1.35)
GFR calc Af Amer: 24 mL/min — ABNORMAL LOW (ref >90)
GFR calc non Af Amer: 20 mL/min — ABNORMAL LOW (ref >90)
GFR calc non Af Amer: 21 mL/min — ABNORMAL LOW (ref >90)
GFR, EST AFRICAN AMERICAN: 24 mL/min — AB (ref >90)
GLUCOSE: 58 mg/dL — AB (ref 70–99)
GLUCOSE: 85 mg/dL (ref 70–99)
Phosphorus: 3.5 mg/dL (ref 2.3–4.6)
Phosphorus: 3.8 mg/dL (ref 2.3–4.6)
Potassium: 4.1 mEq/L (ref 3.7–5.3)
Potassium: 4.3 mEq/L (ref 3.7–5.3)
SODIUM: 138 meq/L (ref 137–147)
Sodium: 138 mEq/L (ref 137–147)

## 2013-04-28 LAB — MAGNESIUM: MAGNESIUM: 2.6 mg/dL — AB (ref 1.5–2.5)

## 2013-04-28 LAB — GLUCOSE, CAPILLARY
GLUCOSE-CAPILLARY: 42 mg/dL — AB (ref 70–99)
GLUCOSE-CAPILLARY: 63 mg/dL — AB (ref 70–99)
GLUCOSE-CAPILLARY: 68 mg/dL — AB (ref 70–99)
GLUCOSE-CAPILLARY: 66 mg/dL — AB (ref 70–99)
Glucose-Capillary: 82 mg/dL (ref 70–99)
Glucose-Capillary: 65 mg/dL — ABNORMAL LOW (ref 70–99)

## 2013-04-28 LAB — CULTURE, BLOOD (ROUTINE X 2)
CULTURE: NO GROWTH
CULTURE: NO GROWTH

## 2013-04-28 LAB — CBC
HCT: 28.6 % — ABNORMAL LOW (ref 39.0–52.0)
Hemoglobin: 9.2 g/dL — ABNORMAL LOW (ref 13.0–17.0)
MCH: 30.7 pg (ref 26.0–34.0)
MCHC: 32.2 g/dL (ref 30.0–36.0)
MCV: 95.3 fL (ref 78.0–100.0)
PLATELETS: 120 10*3/uL — AB (ref 150–400)
RBC: 3 MIL/uL — AB (ref 4.22–5.81)
RDW: 17.3 % — ABNORMAL HIGH (ref 11.5–15.5)
WBC: 33.7 10*3/uL — AB (ref 4.0–10.5)

## 2013-04-28 LAB — POCT ACTIVATED CLOTTING TIME
ACTIVATED CLOTTING TIME: 237 s
ACTIVATED CLOTTING TIME: 193 s
Activated Clotting Time: 182 seconds
Activated Clotting Time: 193 seconds
Activated Clotting Time: 204 seconds
Activated Clotting Time: 210 seconds
Activated Clotting Time: 232 seconds

## 2013-04-28 LAB — APTT

## 2013-04-28 MED ORDER — SODIUM CHLORIDE 0.9 % IJ SOLN
10.0000 mL | INTRAMUSCULAR | Status: DC | PRN
  Administered 2013-04-30 – 2013-05-02 (×2): 30 mL

## 2013-04-28 MED ORDER — PREDNISONE 20 MG PO TABS
20.0000 mg | ORAL_TABLET | Freq: Every day | ORAL | Status: DC
  Administered 2013-04-29: 20 mg via ORAL
  Filled 2013-04-28 (×2): qty 1

## 2013-04-28 MED ORDER — DEXTROSE 50 % IV SOLN
INTRAVENOUS | Status: AC
  Administered 2013-04-28: 50 mL
  Filled 2013-04-28: qty 50

## 2013-04-28 MED ORDER — DEXTROSE 10 % IV SOLN
INTRAVENOUS | Status: DC
  Administered 2013-04-28: via INTRAVENOUS
  Administered 2013-04-30: 75 mL/h via INTRAVENOUS

## 2013-04-28 MED ORDER — SODIUM CHLORIDE 0.9 % IJ SOLN
10.0000 mL | Freq: Two times a day (BID) | INTRAMUSCULAR | Status: DC
  Administered 2013-04-28 – 2013-05-01 (×7): 10 mL

## 2013-04-28 MED ORDER — RENA-VITE PO TABS
1.0000 | ORAL_TABLET | Freq: Every day | ORAL | Status: DC
  Administered 2013-04-28 – 2013-05-02 (×5): 1 via ORAL
  Filled 2013-04-28 (×6): qty 1

## 2013-04-28 NOTE — Progress Notes (Signed)
Patient ID: John Gambler Sr., male   DOB: 26-Mar-1950, 63 y.o.   MRN: [id number]   Patient Name: John R Largent Sr. Date of Encounter: 04/28/2013     Principal Problem:   C. difficile colitis Active Problems:   CKD (chronic kidney disease) stage V requiring chronic dialysis   Septic shock(785.52)   Severe sepsis(995.92)    SUBJECTIVE  63 yo ESRD on HD who presented on 4/11 with chest pain and hypotension.   Feeling "great". Things are much better. He denies CP or SOB.  CURRENT MEDS . aspirin  325 mg Oral Daily  . darbepoetin (ARANESP) injection - DIALYSIS  100 mcg Intravenous Q Wed-HD  . heparin subcutaneous  5,000 Units Subcutaneous 3 times per day  . hydrocortisone sod succinate (SOLU-CORTEF) inj  25 mg Intravenous Q12H  . vancomycin  500 mg Oral 4 times per day   And  . metronidazole  500 mg Intravenous Q8H  . midodrine  10 mg Oral TID WC  . rOPINIRole  1 mg Oral QHS   . dextrose 20 mL/hr at 04/28/13 0400  . heparin 10,000 units/ 20 mL infusion syringe 2,250 Units/hr (04/28/13 0700)  . dialysis replacement fluid (prismasate) 300 mL/hr at 04/28/13 0430  . dialysis replacement fluid (prismasate) 200 mL/hr at 04/27/13 2000  . dialysate (PRISMASATE) 1,500 mL/hr at 04/28/13 0530   OBJECTIVE  Filed Vitals:   04/28/13 0400 04/28/13 0500 04/28/13 0600 04/28/13 0700  BP: 80/30 94/45 90/39  95/52  Pulse: 54 51 55 50  Temp:      TempSrc:      Resp: 12 16 14 14   Height:      Weight:  252 lb 13.9 oz (114.7 kg)    SpO2: 94% 100% 100% 94%    Intake/Output Summary (Last 24 hours) at 04/28/13 0756 Last data filed at 04/28/13 0700  Gross per 24 hour  Intake    862 ml  Output   2365 ml  Net  -1503 ml   Filed Weights   04/26/13 0500 04/27/13 0500 04/28/13 0500  Weight: 256 lb 9.9 oz (116.4 kg) 254 lb 13.6 oz (115.6 kg) 252 lb 13.9 oz (114.7 kg)    PHYSICAL EXAM General: Appears comfortable, supine  Neuro: Awake, alert, interactive  HEENT: PERRL  Cardiovascular:  RRR, no m/r/g  Lungs: Bilateral diminished air entry, few rales  Abdomen: Soft, nontender, bowel sounds diminished  Musculoskeletal: Moves all extremities, no edema  Skin: Intact, AV fistula R UE   Accessory Clinical Findings  CBC  Recent Labs  04/27/13 0410 04/28/13 0351  WBC 31.0* 33.7*  HGB 8.2* 9.2*  HCT 25.6* 28.6*  MCV 93.4 95.3  PLT 79* 599*   Basic Metabolic Panel  Recent Labs  04/27/13 0410 04/27/13 1530 04/28/13 0351  NA 137 136* 138  K 4.4 4.3 4.1  CL 102 98 103  CO2 22 21 23   GLUCOSE 89 83 58*  BUN 53* 49* 44*  CREATININE 3.60* 3.36* 3.05*  CALCIUM 9.0 8.8 8.8  MG 2.6*  --  2.6*  PHOS 3.5 3.2 3.5   Liver Function Tests  Recent Labs  04/27/13 1530 04/28/13 0351  ALBUMIN 2.8* 2.5*    Radiology/Studies  Ct Abdomen Pelvis W Contrast  04/23/2013   CLINICAL DATA:  Worsening hypovolemic shock and history of end-stage renal disease.  EXAM: CT ABDOMEN AND PELVIS WITH CONTRAST  TECHNIQUE: Multidetector CT imaging of the abdomen and pelvis was performed using the standard protocol following bolus administration of  intravenous contrast.  CONTRAST:  137mL OMNIPAQUE IOHEXOL 300 MG/ML  SOLN  COMPARISON:  DG CHEST 1V PORT dated 04/23/2013; DG CHEST 1V PORT dated 04/23/2013  FINDINGS: The visualized lung bases show dense consolidation of both lower lobes, right greater than left. There is a trace amount of right pleural fluid. Air bronchograms are present in areas of consolidation and findings are consistent with pneumonia and potentially aspiration.  The liver demonstrates cirrhotic changes without mass or biliary dilatation. There is a small rim of fluid anterior to the liver. The spleen, pancreas, adrenal glands and kidneys are unremarkable.  The colon is decompressed but demonstrates evidence of probable wall thickening and he see inflammatory change, especially at the level of the ascending colon and proximal transverse colon. Findings are suggestive of colitis. There  is no evidence of free intraperitoneal air or focal abscess. Some mild edema and stranding is noted trauma to the retroperitoneum. There also is some thickening and inflammatory change involving the transverse duodenum that abuts the head of the pancreas. Correlation suggested with any possibility of pancreatitis. No peripancreatic fluid collections are identified.  No vascular abnormalities seen. No focal masses or enlarged lymph nodes are seen. The bladder is decompressed. Bony structures are unremarkable.  IMPRESSION: 1. Pulmonary consolidation with air bronchograms in both lower lobes, right greater than left. Findings are consistent with pneumonia and potentially aspiration. 2. Decompressed but thickened appearance of the colon may be consistent with colitis. There is no evidence of bowel perforation. 3. Diffuse inflammatory stranding throughout the retroperitoneum with some thickening inflammatory change involving the transverse duodenum that abuts the head of the pancreas. This may represent changes related to peptic ulcer disease/duodenitis or pancreatitis. 4. Cirrhotic changes of the liver.   Dg Chest Port 1 View  04/26/2013   CLINICAL DATA:  Respiratory failure  EXAM: PORTABLE CHEST - 1 VIEW  COMPARISON:  April 25, 2013  FINDINGS: Central catheter tip is in the superior vena cava. No pneumothorax. There is mild interstitial edema with small pleural effusions bilaterally. There is moderate cardiomegaly with pulmonary venous hypertension. There is patchy atelectasis in the left base. No adenopathy.  IMPRESSION: Evidence of a degree of congestive heart failure. Patchy atelectasis left base. No pneumothorax.      ASSESSMENT AND PLAN 63 yo ESRD on HD who presented on 4/11 with chest pain and hypotension.  Hypoxemic respiratory failure- acute pulmonary edema. CXR w/ slight interval improvement in the appearance of the pulmonary interstitium since the earlier study. CHF persists with small bilateral  pleural effusions. Continue CVVHD.  Septic shock -sepsis likely d/t C.diff - WBC's were improving now have slightly climbed. 33.7 today -- vasopressin/ levofed D/Cd. Continues on midodrine to 10 tid.    NSTEMI - He probably has underlying CAD - cardiac enzymes went quite high (peak 11.84) to just be from demand ischemia. Suspected to be demand ischemia in the setting of severe hypotension and significant CAD.  Cath pending improved medical status. Needs to be off pressors and preferably Rx infection with d/c of RFV dialysis catheter.  -- Vasopressin D/Cd yesterday. On midodrine TID. Remains hypotensive. Tentative cath on Monday if hemodynamics improve  ESRD: Nepehrology plans to cont CVVHD to cont to pull fluid thru tomorrow then will DC and try IHD on Monday  GASTROINTESTINAL- Pseudomembranous colitis. C. diff positive  -- PO vanc & Flagyl    Presumed adrenal insufficiency- stress dose steroids -can taper once off pressors -- per critical care  Anemia- stable   Hypotension- blood  pressures have been soft.   Signed, Perry Mount PA-C   Patient improving Have tentatively scheduled cath for Monday with Dr Ellyn Hack  Orders written.  Rounding team will decide over weekend it he is on target For this

## 2013-04-28 NOTE — Progress Notes (Signed)
Hypoglycemic Event  CBG: 63  Treatment: 15 GM carbohydrate snack  Symptoms: None  Follow-up CBG: Time:1245 CBG Result:86  Possible Reasons for Event: Unknown       [name]  Remember to initiate Hypoglycemia Order Set & complete

## 2013-04-28 NOTE — Progress Notes (Signed)
eLink Physician-Brief Progress Note Patient Name: Elroy DOB: Jul 07, 1950 MRN: [id number]  Date of Service  04/28/2013   HPI/Events of Note  Persistent hypoglycemia despite increase in D5W infusion.   eICU Interventions  Plan:  Start D10 infusion at 50 cc/hr Adjust rate based on CBG      Elizabeth C Deterding 04/28/2013, 11:39 PM

## 2013-04-28 NOTE — Progress Notes (Signed)
eLink Physician-Brief Progress Note Patient Name: [name] DOB: Sep 01, 1950 MRN: [id number]  Date of Service  04/28/2013   HPI/Events of Note  Hypoglycemia on D5W at [facility] on CRRT.  eICU Interventions  Plan: Will try increase in D5W to 50 cc/hr. If patient remains hypoglycemic will start [place] 04/28/2013, 9:53 PM

## 2013-04-28 NOTE — Progress Notes (Signed)
PULMONARY / CRITICAL CARE MEDICINE  Name: [name] Sr. MRN: [id number] DOB: 1950/08/13    ADMISSION DATE:  04/22/2013 CONSULTATION DATE:  04/22/2013  REFERRING MD :  EDP PRIMARY SERVICE: PCCM  CHIEF COMPLAINT:  Chest pain  BRIEF PATIENT DESCRIPTION:  63 yo ESRD on HD who presented on 4/11 with chest pain and hypotension.  SIGNIFICANT EVENTS / STUDIES:  4/11  Chest CTA >>> NO dissection, NO embolus, IPF, thyroid nodules 4/11  TTE >>> EF 40% 4/12  TTE >>> LVEF 19-75%; normal diastolic function; NO pericardial effusion 4/12  CT abdomen >>> Bibasilar airspace disease, possible colitis without perforation, possible duodenitis / pancreatitis 4/13  CVVH started 4/15  Off vasopressors 4/16  CRRT to continue through day with plans to discontinue tomorrow and attempt IHD 4/19.    LINES / TUBES: L Radial A-line 4/12 >> 4/17 R IJ TLC 4/11 >>  R fem HD cath 4/13 >>   CULTURES: 4/11 MRSA PCR >>> neg 4/11 Blood >> NEG 4/13 C.diff >>> POSITIVE  ANTIBIOTICS: Vancomycin IV 4/11>>>4/14 Ceftazidime 4/12 >>> 4/14 Flagyl IV 4/13 >> 4/17 Vancomycin PO 4/13 >>   INTERVAL HISTORY:  No distress. No new complaints  VITAL SIGNS: Temp:  [97.4 F (36.3 C)-98.3 F (36.8 C)] 97.4 F (36.3 C) (04/17 0832) Pulse Rate:  [50-112] 65 (04/17 1000) Resp:  [12-23] 15 (04/17 1000) BP: (80-101)/(30-71) 92/48 mmHg (04/17 1000) SpO2:  [55 %-100 %] 93 % (04/17 1000) Arterial Line BP: (95-137)/(41-59) 118/49 mmHg (04/17 1000) Weight:  [114.7 kg (252 lb 13.9 oz)] 114.7 kg (252 lb 13.9 oz) (04/17 0500)  HEMODYNAMICS: CVP:  [5 mmHg-16 mmHg] 10 mmHg  VENTILATOR SETTINGS:   INTAKE / OUTPUT: Intake/Output     04/16 0701 - 04/17 0700 04/17 0701 - 04/18 0700   P.O. 90 100   I.V. (mL/kg) 422 (3.7) 60 (0.5)   IV Piggyback 350 100   Total Intake(mL/kg) 862 (7.5) 260 (2.3)   Urine (mL/kg/hr) 300 (0.1)    Other 2064 (0.7) 373 (0.8)   Stool 1 (0)    Total Output 2365 373   Net -1503 -113        Stool  Occurrence 1 x     PHYSICAL EXAMINATION: General:  NAD Neuro: Nonfocal HEENT: WNL Cardiovascular:  RRR s M Lungs: clear Abdomen:  Soft, NT, NABS Ext: warm without edema  LABS: I have reviewed all of today's lab results. Relevant abnormalities are discussed in the A/P section  CXR: CM, improving edema pattern   ASSESSMENT / PLAN:  PULMONARY  A:  Acute resp failure  Pulmonary edema, improving P: Supplemental oxygen to maintain SpO2 > 92%  CARDIOVASCULAR  A:  Shock, resolved Concern for adrenal insuff - tolerating tapering dose of HC NSTEMI, TpI peak 17.40 4/12 P:  Cardiology following Goal MAP 50 mmHg (note MAP on NIBP approx 10 mmHg less than on art line) Midodrine increased 4/16 by Renal Continue ASA Statin contraindicated - elevated transaminases   RENAL  A:  ESRD Metabolic acidosis, resolved Hypervolemia, improving P:  Renal following CRRT to continue through day 4/17 If tolerates IHD 4/19, would DC femoral HD cath Monitor BMET intermittently Monitor I/Os Correct electrolytes as indicated  GASTROINTESTINAL  A:  Elevated transaminases Pseudomembranous colitis P:  SUP: N/I Tolerating diet PO  HEMATOLOGIC  A:  Anemia without acute blood loss  P:  DVT Px: SCDs Trend CBC  INFECTIOUS  A:  Severe sepsis, resolving C. diff colitis P:  Micro and abx as above  ENDOCRINE  A:  Presumed adrenal insufficiency P:  DC HC. Prednisone 20 mg/day starting 4/18. Taper to off as tolerated by BP Consider formal eval for adrenal insuff if hypotension remains problematic  NEUROLOGIC  A:  No active issues P:  Fentanyl PRN    Merton Border, MD;  PCCM service; Mobile [phone]

## 2013-04-28 NOTE — Progress Notes (Signed)
CVVHD management.  Remains metabolically stable.  Off pressors but BP still lowish.  CXR shows improved pulm edema but still with some fluid.  My plan remains to cont CVVHD to cont to pull fluid thru tomorrow then will DC and try IHD on Monday.  Some bleeding from cath site, will DC SQ heparin.

## 2013-04-28 NOTE — Progress Notes (Signed)
Hypoglycemic Event  CBG: 42  Treatment: 15 GM carbohydrate snack  Symptoms: None  Follow-up CBG: Time:0900 CBG Result:82  Possible Reasons for Event: Unknown     Lenox Ahr  Remember to initiate Hypoglycemia Order Set & complete

## 2013-04-29 DIAGNOSIS — R1032 Left lower quadrant pain: Secondary | ICD-10-CM

## 2013-04-29 LAB — GLUCOSE, CAPILLARY
GLUCOSE-CAPILLARY: 86 mg/dL (ref 70–99)
GLUCOSE-CAPILLARY: 73 mg/dL (ref 70–99)
GLUCOSE-CAPILLARY: 45 mg/dL — AB (ref 70–99)
GLUCOSE-CAPILLARY: 72 mg/dL (ref 70–99)
GLUCOSE-CAPILLARY: 107 mg/dL — AB (ref 70–99)
GLUCOSE-CAPILLARY: 98 mg/dL (ref 70–99)
Glucose-Capillary: 50 mg/dL — ABNORMAL LOW (ref 70–99)
Glucose-Capillary: 58 mg/dL — ABNORMAL LOW (ref 70–99)
Glucose-Capillary: 59 mg/dL — ABNORMAL LOW (ref 70–99)
Glucose-Capillary: 61 mg/dL — ABNORMAL LOW (ref 70–99)
Glucose-Capillary: 76 mg/dL (ref 70–99)
Glucose-Capillary: 66 mg/dL — ABNORMAL LOW (ref 70–99)
Glucose-Capillary: 93 mg/dL (ref 70–99)
Glucose-Capillary: 99 mg/dL (ref 70–99)

## 2013-04-29 LAB — RENAL FUNCTION PANEL
ALBUMIN: 2.5 g/dL — AB (ref 3.5–5.2)
Albumin: 2.5 g/dL — ABNORMAL LOW (ref 3.5–5.2)
BUN: 36 mg/dL — AB (ref 6–23)
BUN: 31 mg/dL — ABNORMAL HIGH (ref 6–23)
CHLORIDE: 103 meq/L (ref 96–112)
CO2: 24 meq/L (ref 19–32)
CO2: 23 mEq/L (ref 19–32)
CREATININE: 2.94 mg/dL — AB (ref 0.50–1.35)
CREATININE: 2.92 mg/dL — AB (ref 0.50–1.35)
Calcium: 8.5 mg/dL (ref 8.4–10.5)
Calcium: 8.9 mg/dL (ref 8.4–10.5)
Chloride: 99 mEq/L (ref 96–112)
GFR calc Af Amer: 25 mL/min — ABNORMAL LOW (ref >90)
GFR calc Af Amer: 25 mL/min — ABNORMAL LOW (ref >90)
GFR calc non Af Amer: 21 mL/min — ABNORMAL LOW (ref >90)
GFR calc non Af Amer: 21 mL/min — ABNORMAL LOW (ref >90)
GLUCOSE: 85 mg/dL (ref 70–99)
GLUCOSE: 111 mg/dL — AB (ref 70–99)
PHOSPHORUS: 3.2 mg/dL (ref 2.3–4.6)
POTASSIUM: 4.2 meq/L (ref 3.7–5.3)
POTASSIUM: 5.2 meq/L (ref 3.7–5.3)
Phosphorus: 3.8 mg/dL (ref 2.3–4.6)
Sodium: 139 mEq/L (ref 137–147)
Sodium: 133 mEq/L — ABNORMAL LOW (ref 137–147)

## 2013-04-29 LAB — APTT
aPTT: >200 seconds (ref 24–37)
aPTT: 32 seconds (ref 24–37)

## 2013-04-29 LAB — CBC
HEMATOCRIT: 29.1 % — AB (ref 39.0–52.0)
HEMOGLOBIN: 9.1 g/dL — AB (ref 13.0–17.0)
MCH: 30 pg (ref 26.0–34.0)
MCHC: 31.3 g/dL (ref 30.0–36.0)
MCV: 96 fL (ref 78.0–100.0)
Platelets: 144 10*3/uL — ABNORMAL LOW (ref 150–400)
RBC: 3.03 MIL/uL — AB (ref 4.22–5.81)
RDW: 17.5 % — ABNORMAL HIGH (ref 11.5–15.5)
WBC: 27.6 10*3/uL — ABNORMAL HIGH (ref 4.0–10.5)

## 2013-04-29 LAB — MAGNESIUM: MAGNESIUM: 2.4 mg/dL (ref 1.5–2.5)

## 2013-04-29 LAB — POCT ACTIVATED CLOTTING TIME
ACTIVATED CLOTTING TIME: 188 s
ACTIVATED CLOTTING TIME: 193 s
Activated Clotting Time: 188 seconds
Activated Clotting Time: 199 seconds

## 2013-04-29 LAB — CLOSTRIDIUM DIFFICILE BY PCR: CDIFFPCR: NEGATIVE

## 2013-04-29 MED ORDER — DEXTROSE 50 % IV SOLN
INTRAVENOUS | Status: AC
  Filled 2013-04-29: qty 50

## 2013-04-29 MED ORDER — DEXTROSE 50 % IV SOLN
50.0000 mL | Freq: Once | INTRAVENOUS | Status: AC | PRN
  Administered 2013-04-29: 50 mL via INTRAVENOUS

## 2013-04-29 MED ORDER — DEXTROSE 50 % IV SOLN
INTRAVENOUS | Status: AC
  Administered 2013-04-29: 50 mL
  Filled 2013-04-29: qty 50

## 2013-04-29 MED ORDER — HYDROCORTISONE NA SUCCINATE PF 100 MG IJ SOLR
50.0000 mg | Freq: Four times a day (QID) | INTRAMUSCULAR | Status: DC
  Administered 2013-04-29 – 2013-04-30 (×6): 50 mg via INTRAVENOUS
  Administered 2013-04-30: 19:00:00 via INTRAVENOUS
  Administered 2013-05-01 – 2013-05-03 (×8): 50 mg via INTRAVENOUS
  Filled 2013-04-29 (×21): qty 1

## 2013-04-29 NOTE — Progress Notes (Signed)
CRRT finished, IV team at bedside to remove HD cath per MD orders. Cath removed without complications, no bleeding and pt not complaining of pain. VS WNL, GCS 15.

## 2013-04-29 NOTE — Progress Notes (Signed)
CVVHD management.  Metabolically stable.  Off pressors with BP 90's.  Bleeding from HD cath.  Will DC heparin.  DC CVVHD later today and then will pull HD cath so he can get up to chair.

## 2013-04-29 NOTE — Progress Notes (Addendum)
PULMONARY / CRITICAL CARE MEDICINE  Name: John R Finkle Sr. MRN: [id number] DOB: Oct 24, 1950    ADMISSION DATE:  04/22/2013 CONSULTATION DATE:  04/22/2013  REFERRING MD :  EDP PRIMARY SERVICE: PCCM  CHIEF COMPLAINT:  Chest pain  BRIEF PATIENT DESCRIPTION:  63 yo ESRD on HD who presented on 4/11 with chest pain and hypotension.  SIGNIFICANT EVENTS / STUDIES:  4/11  Chest CTA >>> NO dissection, NO embolus, IPF, thyroid nodules 4/11  TTE >>> EF 40% 4/12  TTE >>> LVEF 39-76%; normal diastolic function; NO pericardial effusion 4/12  CT abdomen >>> Bibasilar airspace disease, possible colitis without perforation, possible duodenitis / pancreatitis 4/13  CVVH started 4/15  Off vasopressors 4/16  CRRT to continue through day with plans to discontinue tomorrow and attempt IHD 4/19.  4/18 - continued hypoglcyemia   LINES / TUBES: L Radial A-line 4/12 >> 4/17 R IJ TLC 4/11 >>  R fem HD cath 4/13 >>   CULTURES: 4/11 MRSA PCR >>> neg 4/11 Blood >> NEG 4/13 C.diff >>> POSITIVE  ANTIBIOTICS: Vancomycin IV 4/11>>>4/14 Ceftazidime 4/12 >>> 4/14 Flagyl IV 4/13 >> 4/17 Vancomycin PO 4/13 >>   INTERVAL HISTORY:  Resistance hypoglycemia overnight, required D10 infusion  Has poor intake-discussed importance of eating.-says he will do better.   VITAL SIGNS: Temp:  [97.5 F (36.4 C)-98.3 F (36.8 C)] 98.3 F (36.8 C) (04/18 0739) Pulse Rate:  [49-123] 53 (04/18 1100) Resp:  [15-26] 15 (04/18 1100) BP: (79-102)/(34-52) 92/50 mmHg (04/18 1100) SpO2:  [91 %-100 %] 93 % (04/18 1100) Arterial Line BP: (100-140)/(39-58) 139/58 mmHg (04/17 1800) Weight:  [113 kg (249 lb 1.9 oz)] 113 kg (249 lb 1.9 oz) (04/18 0800)  HEMODYNAMICS: CVP:  [2 mmHg-35 mmHg] 18 mmHg  VENTILATOR SETTINGS:   INTAKE / OUTPUT: Intake/Output     04/17 0701 - 04/18 0700 04/18 0701 - 04/19 0700   P.O. 790 100   I.V. (mL/kg) 750 (6.5) 277.9 (2.5)   IV Piggyback 100    Total Intake(mL/kg) 1640 (14.3) 377.9 (3.3)   Urine (mL/kg/hr) 695 (0.3) 175 (0.3)   Other 1725 (0.6) 547 (1)   Stool 501 (0.2)    Total Output 2921 722   Net -1281 -344.1        Stool Occurrence  1 x    PHYSICAL EXAMINATION: General:  NAD Neuro: Nonfocal HEENT: WNL Cardiovascular:  RRR s M Lungs: clear Abdomen:  Soft, NT, NABS Ext: warm without edema  LABS: I have reviewed all of today's lab results. Relevant abnormalities are discussed in the A/P section  CXR: CM, improving edema pattern 4/17    ASSESSMENT / PLAN:  PULMONARY  A:  Acute resp failure -resolved. Sats adequate on RA  Pulmonary edema, improving P: Supplemental oxygen to maintain SpO2 > 92%  CARDIOVASCULAR  A:  Shock, resolved Concern for adrenal insuff - tolerating tapering dose of HC NSTEMI, TpI peak 17.40 4/12 P:  Cardiology following Goal MAP 50 mmHg (note MAP on NIBP approx 10 mmHg less than on art line) Midodrine increased 4/16 by Renal Continue ASA Statin contraindicated - elevated transaminases, see endocrine Continued hypoglycemia, increasing d10 rate, back to stress IV roids No florinef as no renal fxn to use this med  RENAL  A:  ESRD Metabolic acidosis, resolved Hypervolemia, improving P:  Renal following CRRT to continue through day 4/17 If tolerates IHD 4/19, would DC femoral HD cath Monitor BMET intermittently Monitor I/Os Correct electrolytes as indicated  GASTROINTESTINAL  A:  Elevated transaminases -  continued hypoglycemia Pseudomembranous colitis P:  SUP: N/I Cont diet  Repeat lft now  HEMATOLOGIC  A:  Anemia without acute blood loss Leukocytosis from cdiff P:  DVT Px: SCDs Trend CBC for wbc trend  INFECTIOUS  A:  Severe sepsis, resolving C. diff colitis P:  continued oral vanc,. Will need to establish a stop date  ENDOCRINE  A:  Presumed adrenal insufficiency Hypoglycemia ? Poor intake prior lT contribution?  P:  DC HC 4/17  Prednisone 20 dc , add back stress IV steroids May need imaging of  adrenal glands for a primary  IV D10 increased rate LFT now May need acth, may be tough to interpret on roids tsh No florinef  NEUROLOGIC  A:  No active issues P:  Fentanyl PRN  Tammy Parrett NP-C  [facility]  [phone]  I have fully examined this patient and agree with above findings.      Lavon Paganini. Titus Mould, MD, [place] Pgr: [facility]

## 2013-04-29 NOTE — Progress Notes (Signed)
Hypoglycemic Event  CBG: 66  Treatment: D50 IV 25 mL  Symptoms: None  Follow-up CBG: Time:0810 CBG Result:99  Possible Reasons for Event: Unknown  Comments/MD notified:[place], CCM - instructed to increase D10 to 75cc/hr    Irven [place]  Remember to initiate Hypoglycemia Order Set & complete

## 2013-04-29 NOTE — Progress Notes (Signed)
eLink Physician-Brief Progress Note Patient Name: John SUMMERVILLE Sr. DOB: [birth year] MRN: [id number]  Date of Service  04/29/2013   HPI/Events of Note   Mild Hypotension. MAP ~ 50. Awake and interactive.  eICU Interventions   Monitor.    Intervention Category Major Interventions: Hypotension - evaluation and management  Mariea Clonts 04/29/2013, 9:14 PM

## 2013-04-30 LAB — GLUCOSE, CAPILLARY
GLUCOSE-CAPILLARY: 102 mg/dL — AB (ref 70–99)
Glucose-Capillary: 118 mg/dL — ABNORMAL HIGH (ref 70–99)
Glucose-Capillary: 123 mg/dL — ABNORMAL HIGH (ref 70–99)
Glucose-Capillary: 106 mg/dL — ABNORMAL HIGH (ref 70–99)

## 2013-04-30 LAB — COMPREHENSIVE METABOLIC PANEL
ALBUMIN: 2.6 g/dL — AB (ref 3.5–5.2)
ALT: 18 U/L (ref 0–53)
AST: 15 U/L (ref 0–37)
Alkaline Phosphatase: 90 U/L (ref 39–117)
BUN: 46 mg/dL — ABNORMAL HIGH (ref 6–23)
CALCIUM: 9.3 mg/dL (ref 8.4–10.5)
CO2: 23 meq/L (ref 19–32)
Chloride: 101 mEq/L (ref 96–112)
Creatinine, Ser: 4.28 mg/dL — ABNORMAL HIGH (ref 0.50–1.35)
GFR calc Af Amer: 16 mL/min — ABNORMAL LOW (ref >90)
GFR, EST NON AFRICAN AMERICAN: 13 mL/min — AB (ref >90)
Glucose, Bld: 124 mg/dL — ABNORMAL HIGH (ref 70–99)
Potassium: 4.9 mEq/L (ref 3.7–5.3)
SODIUM: 133 meq/L — AB (ref 137–147)
TOTAL PROTEIN: 5.9 g/dL — AB (ref 6.0–8.3)
Total Bilirubin: 0.3 mg/dL (ref 0.3–1.2)

## 2013-04-30 LAB — POCT ACTIVATED CLOTTING TIME: Activated Clotting Time: 199 seconds

## 2013-04-30 LAB — MAGNESIUM: Magnesium: 2.6 mg/dL — ABNORMAL HIGH (ref 1.5–2.5)

## 2013-04-30 LAB — CBC
HEMATOCRIT: 28.7 % — AB (ref 39.0–52.0)
HEMOGLOBIN: 9 g/dL — AB (ref 13.0–17.0)
MCH: 29.8 pg (ref 26.0–34.0)
MCHC: 31.4 g/dL (ref 30.0–36.0)
MCV: 95 fL (ref 78.0–100.0)
Platelets: 191 10*3/uL (ref 150–400)
RBC: 3.02 MIL/uL — ABNORMAL LOW (ref 4.22–5.81)
RDW: 17.6 % — AB (ref 11.5–15.5)
WBC: 32.2 10*3/uL — ABNORMAL HIGH (ref 4.0–10.5)

## 2013-04-30 LAB — PHOSPHORUS: PHOSPHORUS: 4.4 mg/dL (ref 2.3–4.6)

## 2013-04-30 LAB — APTT: APTT: 32 s (ref 24–37)

## 2013-04-30 LAB — TSH: TSH: 1.53 u[IU]/mL (ref 0.350–4.500)

## 2013-04-30 MED ORDER — ASPIRIN 81 MG PO CHEW
81.0000 mg | CHEWABLE_TABLET | ORAL | Status: AC
  Administered 2013-05-01: 81 mg via ORAL
  Filled 2013-04-30: qty 1

## 2013-04-30 MED ORDER — WHITE PETROLATUM GEL
Status: AC
  Administered 2013-04-30: 0.2
  Filled 2013-04-30: qty 5

## 2013-04-30 NOTE — Progress Notes (Signed)
S: feels well.  Having formed stools O:BP 99/58  Pulse 61  Temp(Src) 98.4 F (36.9 C) (Oral)  Resp 15  Ht 6' (1.829 m)  Wt 114.1 kg (251 lb 8.7 oz)  BMI 34.11 kg/m2  SpO2 92%  Intake/Output Summary (Last 24 hours) at 04/30/13 0831 Last data filed at 04/30/13 0800  Gross per 24 hour  Intake   2250 ml  Output   2600 ml  Net   -350 ml   Weight change:  PPI:RJJOA and alert CVS:RRR Resp:clear Abd:+ BS NTND Ext:No edema. RUA AVF + bruit NEURO:CNI Ox3 no asterixis   . aspirin  325 mg Oral Daily  . darbepoetin (ARANESP) injection - DIALYSIS  100 mcg Intravenous Q Wed-HD  . hydrocortisone sodium succinate  50 mg Intravenous 4 times per day  . midodrine  10 mg Oral TID WC  . multivitamin  1 tablet Oral QHS  . rOPINIRole  1 mg Oral QHS  . sodium chloride  10-40 mL Intracatheter Q12H  . vancomycin  500 mg Oral 4 times per day   No results found. BMET    Component Value Date/Time   NA 133* 04/30/2013 0415   K 4.9 04/30/2013 0415   CL 101 04/30/2013 0415   CO2 23 04/30/2013 0415   GLUCOSE 124* 04/30/2013 0415   BUN 46* 04/30/2013 0415   CREATININE 4.28* 04/30/2013 0415   CALCIUM 9.3 04/30/2013 0415   CALCIUM 9.2 04/23/2010 0500   GFRNONAA 13* 04/30/2013 0415   GFRAA 16* 04/30/2013 0415   CBC    Component Value Date/Time   WBC 32.2* 04/30/2013 0415   RBC 3.02* 04/30/2013 0415   HGB 9.0* 04/30/2013 0415   HCT 28.7* 04/30/2013 0415   PLT 191 04/30/2013 0415   MCV 95.0 04/30/2013 0415   MCH 29.8 04/30/2013 0415   MCHC 31.4 04/30/2013 0415   RDW 17.6* 04/30/2013 0415   LYMPHSABS 0.3* 04/22/2013 1540   MONOABS 0.3 04/22/2013 1540   EOSABS 0.0 04/22/2013 1540   BASOSABS 0.0 04/22/2013 1540     Assessment: 1. Pulm edema, improved 2. C Diff colitis 3. Hypotension, improved 4. NSTEMI, ? For cath in AM 5. Anemia 6. ESRD  Plan: 1. HD in AM 2. Wean steroids   Windy Kalata

## 2013-04-30 NOTE — Progress Notes (Signed)
Pt. oriented to room, family at the bedside. No c/o pain. BP running  soft pt asymptomatic. Continue with contact precautions.

## 2013-04-30 NOTE — Progress Notes (Signed)
PULMONARY / CRITICAL CARE MEDICINE  Name: John R Nerio Sr. MRN: [id number] DOB: [birth year]    ADMISSION DATE:  04/22/2013 CONSULTATION DATE:  04/22/2013  REFERRING MD :  EDP PRIMARY SERVICE: PCCM  CHIEF COMPLAINT:  Chest pain  BRIEF PATIENT DESCRIPTION:  63 yo ESRD on HD who presented on 4/11 with chest pain and hypotension.  SIGNIFICANT EVENTS / STUDIES:  4/11  Chest CTA >>> NO dissection, NO embolus, IPF, thyroid nodules 4/11  TTE >>> EF 40% 4/12  TTE >>> LVEF 55-73%; normal diastolic function; NO pericardial effusion 4/12  CT abdomen >>> Bibasilar airspace disease, possible colitis without perforation, possible duodenitis / pancreatitis 4/13  CVVH started 4/15  Off vasopressors 4/16  CRRT to continue through day with plans to discontinue tomorrow and attempt IHD 4/19.  4/18  continued hypoglcyemia 4/19  Transfer to telemetry   LINES / TUBES: L Radial A-line 4/12 >> 4/17 R IJ TLC 4/11 >>  R fem HD cath 4/13 >>   CULTURES: 4/11 MRSA PCR >>> neg 4/11 Blood >> NEG 4/13 C.diff >>> POSITIVE  ANTIBIOTICS: Vancomycin IV 4/11>>>4/14 Ceftazidime 4/12 >>> 4/14 Flagyl IV 4/13 >> 4/17 Vancomycin PO 4/13 >>   INTERVAL HISTORY:  Feels better.  Blood sugars improved.  Denies chest/abd pain.  Tolerating diet.  VITAL SIGNS: Temp:  [97.6 F (36.4 C)-98.7 F (37.1 C)] 98.4 F (36.9 C) (04/19 0722) Pulse Rate:  [53-73] 66 (04/19 1000) Resp:  [8-21] 16 (04/19 1000) BP: (75-112)/(21-58) 89/48 mmHg (04/19 1000) SpO2:  [87 %-96 %] 96 % (04/19 1000) Weight:  [251 lb 8.7 oz (114.1 kg)] 251 lb 8.7 oz (114.1 kg) (04/19 0600)  HEMODYNAMICS: CVP:  [6 mmHg-17 mmHg] 10 mmHg  INTAKE / OUTPUT: Intake/Output     04/18 0701 - 04/19 0700 04/19 0701 - 04/20 0700   P.O. 600    I.V. (mL/kg) 1627.9 (14.3) 75 (0.7)   IV Piggyback     Total Intake(mL/kg) 2227.9 (19.5) 75 (0.7)   Urine (mL/kg/hr) 475 (0.2)    Other 1228 (0.4)    Stool 1000 (0.4)    Total Output 2703     Net -475.1 +75     Urine Occurrence 3 x 1 x   Stool Occurrence 1 x 1 x    PHYSICAL EXAMINATION: General: no distress, sitting in chair Neuro: pleasant, calm, normal strength HEENT: Lt IJ CVL site clean Cardiovascular: regular Lungs: no wheeze Abdomen: soft, non tender Ext: no edema  LABS: CBC Recent Labs     04/28/13  0351  04/29/13  0430  04/30/13  0415  WBC  33.7*  27.6*  32.2*  HGB  9.2*  9.1*  9.0*  HCT  28.6*  29.1*  28.7*  PLT  120*  144*  191    Coag's Recent Labs     04/29/13  0430  04/29/13  1600  04/30/13  0415  APTT  >200*  32  32    BMET Recent Labs     04/29/13  0500  04/29/13  1600  04/30/13  0415  NA  139  133*  133*  K  4.2  5.2  4.9  CL  103  99  101  CO2  24  23  23   BUN  36*  31*  46*  CREATININE  2.94*  2.92*  4.28*  GLUCOSE  85  111*  124*    Electrolytes Recent Labs     04/28/13  0351   04/29/13  0430  04/29/13  0500  04/29/13  1600  04/30/13  0415  CALCIUM  8.8   < >   --   8.5  8.9  9.3  MG  2.6*   --   2.4   --    --   2.6*  PHOS  3.5   < >   --   3.8  3.2  4.4   < > = values in this interval not displayed.   Liver Enzymes Recent Labs     04/29/13  0500  04/29/13  1600  04/30/13  0415  AST   --    --   15  ALT   --    --   18  ALKPHOS   --    --   90  BILITOT   --    --   0.3  ALBUMIN  2.5*  2.5*  2.6*   Glucose Recent Labs     04/29/13  1208  04/29/13  1621  04/29/13  1954  04/30/13  0007  04/30/13  0408  04/30/13  0727  GLUCAP  93  107*  98  102*  118*  123*    Imaging No results found.  ASSESSMENT / PLAN:  PULMONARY  A:  Acute resp failure 2nd to acute pulmonary edema -resolved. P: PRN oxygen to keep SpO2 > 92%  CARDIOVASCULAR  A:  Septic shock 2nd to C diff. Chronic hypotension. NSTEMI. P:  Transfer to telemetry 4/19 Continue ASA Tentative plan for cardiac cath 4/20 Keep CVL in until after cardiac cath Goal MAP > 50  Continue midodrine, solu cortef for now  RENAL  A:  ESRD. Metabolic acidosis  2nd to shock >> resolved. P:  HD per renal >> plan for next round on 4/20 Monitor renal fx, electrolytes  GASTROINTESTINAL  A:  Elevated LFT's 2nd to shock >> resolved. Nutrition. P:  Renal diet  HEMATOLOGIC  A:  Anemia of critical illness, and chronic disease. P:  SCD's for DVT prevention F/u CBC Transfuse for Hb < 7  INFECTIOUS  A:  Septic shock 2nd to C diff colitis. P:  Day 7 of oral vancomycin  ENDOCRINE  A:  Presumed adrenal insufficiency. Hypoglycemia >> resolved 4/19. P:  Continue solu cortef for now D/c D10 in IV fluids Continue to monitor CBG's q6h  NEUROLOGIC  A:  No active issues P:  Fentanyl PRN  Transfer to telemetry 4/19.  Keep on PCCM service.  Tentative plan for cardiac cath 4/20 >> if negative, then will likely be ready for d/c home soon.  Chesley Mires, MD [facility] 04/30/2013, 11:11 AM Pager:  [phone] After 3pm call: [phone]

## 2013-05-01 ENCOUNTER — Encounter (HOSPITAL_COMMUNITY)
Admission: EM | Disposition: A | Discharge status: Home / Self Care | Payer: Medicare Other | Source: Home / Self Care | Attending: Pulmonary Disease

## 2013-05-01 DIAGNOSIS — R570 Cardiogenic shock: Secondary | ICD-10-CM

## 2013-05-01 DIAGNOSIS — I214 Non-ST elevation (NSTEMI) myocardial infarction: Secondary | ICD-10-CM

## 2013-05-01 HISTORY — PX: PERCUTANEOUS CORONARY STENT INTERVENTION (PCI-S): SHX5485

## 2013-05-01 HISTORY — PX: LEFT HEART CATHETERIZATION WITH CORONARY ANGIOGRAM: SHX5451

## 2013-05-01 LAB — CREATININE, SERUM
CREATININE: 6.53 mg/dL — AB (ref 0.50–1.35)
GFR calc Af Amer: 9 mL/min — ABNORMAL LOW (ref >90)
GFR, EST NON AFRICAN AMERICAN: 8 mL/min — AB (ref >90)

## 2013-05-01 LAB — CBC
HCT: 27.2 % — ABNORMAL LOW (ref 39.0–52.0)
HEMATOCRIT: 27.6 % — AB (ref 39.0–52.0)
HEMOGLOBIN: 8.8 g/dL — AB (ref 13.0–17.0)
Hemoglobin: 9 g/dL — ABNORMAL LOW (ref 13.0–17.0)
MCH: 31 pg (ref 26.0–34.0)
MCH: 30.7 pg (ref 26.0–34.0)
MCHC: 32.6 g/dL (ref 30.0–36.0)
MCHC: 32.4 g/dL (ref 30.0–36.0)
MCV: 95.2 fL (ref 78.0–100.0)
MCV: 94.8 fL (ref 78.0–100.0)
PLATELETS: 195 10*3/uL (ref 150–400)
Platelets: 215 10*3/uL (ref 150–400)
RBC: 2.9 MIL/uL — AB (ref 4.22–5.81)
RBC: 2.87 MIL/uL — AB (ref 4.22–5.81)
RDW: 17.6 % — ABNORMAL HIGH (ref 11.5–15.5)
RDW: 17.8 % — ABNORMAL HIGH (ref 11.5–15.5)
WBC: 25.2 10*3/uL — ABNORMAL HIGH (ref 4.0–10.5)
WBC: 21.1 10*3/uL — AB (ref 4.0–10.5)

## 2013-05-01 LAB — RENAL FUNCTION PANEL
Albumin: 2.7 g/dL — ABNORMAL LOW (ref 3.5–5.2)
BUN: 73 mg/dL — AB (ref 6–23)
CHLORIDE: 101 meq/L (ref 96–112)
CO2: 20 mEq/L (ref 19–32)
Calcium: 9.3 mg/dL (ref 8.4–10.5)
Creatinine, Ser: 6.41 mg/dL — ABNORMAL HIGH (ref 0.50–1.35)
GFR calc non Af Amer: 8 mL/min — ABNORMAL LOW (ref >90)
GFR, EST AFRICAN AMERICAN: 10 mL/min — AB (ref >90)
Glucose, Bld: 96 mg/dL (ref 70–99)
Phosphorus: 7.6 mg/dL — ABNORMAL HIGH (ref 2.3–4.6)
Potassium: 5 mEq/L (ref 3.7–5.3)
SODIUM: 137 meq/L (ref 137–147)

## 2013-05-01 LAB — GLUCOSE, CAPILLARY
GLUCOSE-CAPILLARY: 110 mg/dL — AB (ref 70–99)
GLUCOSE-CAPILLARY: 80 mg/dL (ref 70–99)
GLUCOSE-CAPILLARY: 91 mg/dL (ref 70–99)
Glucose-Capillary: 102 mg/dL — ABNORMAL HIGH (ref 70–99)

## 2013-05-01 LAB — POCT ACTIVATED CLOTTING TIME
ACTIVATED CLOTTING TIME: 193 s
ACTIVATED CLOTTING TIME: 155 s
ACTIVATED CLOTTING TIME: 105 s
Activated Clotting Time: 227 seconds
Activated Clotting Time: 171 seconds
Activated Clotting Time: 337 seconds
Activated Clotting Time: 420 seconds
Activated Clotting Time: 348 seconds

## 2013-05-01 LAB — APTT: aPTT: 29 seconds (ref 24–37)

## 2013-05-01 SURGERY — LEFT HEART CATHETERIZATION WITH CORONARY ANGIOGRAM
Anesthesia: LOCAL

## 2013-05-01 MED ORDER — CINACALCET HCL 30 MG PO TABS
60.0000 mg | ORAL_TABLET | Freq: Every day | ORAL | Status: DC
  Administered 2013-05-01 – 2013-05-03 (×2): 60 mg via ORAL
  Filled 2013-05-01 (×4): qty 2

## 2013-05-01 MED ORDER — SODIUM CHLORIDE 0.9 % IV SOLN
INTRAVENOUS | Status: DC
  Administered 2013-05-01: 10 mL/h via INTRAVENOUS

## 2013-05-01 MED ORDER — SEVELAMER CARBONATE 800 MG PO TABS
2400.0000 mg | ORAL_TABLET | Freq: Three times a day (TID) | ORAL | Status: DC
  Administered 2013-05-01 – 2013-05-03 (×4): 2400 mg via ORAL
  Filled 2013-05-01 (×8): qty 3

## 2013-05-01 MED ORDER — HEPARIN SODIUM (PORCINE) 1000 UNIT/ML IJ SOLN
INTRAMUSCULAR | Status: AC
  Filled 2013-05-01: qty 1

## 2013-05-01 MED ORDER — HEPARIN (PORCINE) IN NACL 2-0.9 UNIT/ML-% IJ SOLN
INTRAMUSCULAR | Status: AC
  Filled 2013-05-01: qty 500

## 2013-05-01 MED ORDER — MIDAZOLAM HCL 2 MG/2ML IJ SOLN
INTRAMUSCULAR | Status: AC
  Filled 2013-05-01: qty 2

## 2013-05-01 MED ORDER — CLOPIDOGREL BISULFATE 75 MG PO TABS
75.0000 mg | ORAL_TABLET | Freq: Every day | ORAL | Status: DC
  Administered 2013-05-02 – 2013-05-03 (×2): 75 mg via ORAL
  Filled 2013-05-01 (×3): qty 1

## 2013-05-01 MED ORDER — TIROFIBAN HCL IV 5 MG/100ML
0.0750 ug/kg/min | INTRAVENOUS | Status: AC
  Administered 2013-05-02: 0.075 ug/kg/min via INTRAVENOUS
  Filled 2013-05-01 (×3): qty 100

## 2013-05-01 MED ORDER — CLOPIDOGREL BISULFATE 300 MG PO TABS
ORAL_TABLET | ORAL | Status: AC
  Filled 2013-05-01: qty 1

## 2013-05-01 MED ORDER — TIROFIBAN HCL IV 12.5 MG/250 ML
INTRAVENOUS | Status: AC
  Filled 2013-05-01: qty 250

## 2013-05-01 MED ORDER — ATROPINE SULFATE 0.1 MG/ML IJ SOLN
INTRAMUSCULAR | Status: AC
  Filled 2013-05-01: qty 10

## 2013-05-01 MED ORDER — HEPARIN SODIUM (PORCINE) 5000 UNIT/ML IJ SOLN
5000.0000 [IU] | Freq: Three times a day (TID) | INTRAMUSCULAR | Status: DC
  Administered 2013-05-01 – 2013-05-03 (×5): 5000 [IU] via SUBCUTANEOUS
  Filled 2013-05-01 (×6): qty 1

## 2013-05-01 MED ORDER — HYDROMORPHONE HCL PF 1 MG/ML IJ SOLN
INTRAMUSCULAR | Status: AC
  Filled 2013-05-01: qty 1

## 2013-05-01 MED ORDER — NITROGLYCERIN 0.2 MG/ML ON CALL CATH LAB
INTRAVENOUS | Status: AC
  Filled 2013-05-01: qty 1

## 2013-05-01 MED ORDER — FENTANYL CITRATE 0.05 MG/ML IJ SOLN
INTRAMUSCULAR | Status: AC
  Filled 2013-05-01: qty 2

## 2013-05-01 MED ORDER — SODIUM CHLORIDE 0.9 % IJ SOLN: 3.0000 mL | Freq: Two times a day (BID) | INTRAMUSCULAR | Status: DC

## 2013-05-01 MED ORDER — SODIUM CHLORIDE 0.9 % IJ SOLN: 3.0000 mL | INTRAMUSCULAR | Status: DC | PRN

## 2013-05-01 MED ORDER — HEPARIN (PORCINE) IN NACL 2-0.9 UNIT/ML-% IJ SOLN
INTRAMUSCULAR | Status: AC
  Filled 2013-05-01: qty 1000

## 2013-05-01 MED ORDER — MIDAZOLAM HCL 2 MG/2ML IJ SOLN
INTRAMUSCULAR | Status: AC
Start: 2013-05-01 — End: 2013-05-01
  Filled 2013-05-01: qty 2

## 2013-05-01 MED ORDER — MORPHINE SULFATE 2 MG/ML IJ SOLN
2.0000 mg | INTRAMUSCULAR | Status: DC | PRN
  Administered 2013-05-01: 2 mg via INTRAVENOUS
  Filled 2013-05-01: qty 1

## 2013-05-01 MED ORDER — SODIUM CHLORIDE 0.9 % IV SOLN: 1.0000 mL/kg/h | INTRAVENOUS | Status: AC

## 2013-05-01 MED ORDER — BIVALIRUDIN 250 MG IV SOLR
INTRAVENOUS | Status: AC
  Filled 2013-05-01: qty 250

## 2013-05-01 MED ORDER — SODIUM CHLORIDE 0.9 % IV SOLN: 250.0000 mL | INTRAVENOUS | Status: DC | PRN

## 2013-05-01 MED ORDER — LIDOCAINE HCL (PF) 1 % IJ SOLN
INTRAMUSCULAR | Status: AC
  Filled 2013-05-01: qty 30

## 2013-05-01 NOTE — Interval H&P Note (Signed)
History and Physical Interval Note:  05/01/2013 9:33 AM  John R Shane Sr.  has presented today for surgery, with the diagnosis of NSTEMI.  The various methods of treatment have been discussed with the patient and family. After consideration of risks, benefits and other options for treatment, the patient has consented to  Procedure(s): LEFT HEART CATHETERIZATION WITH CORONARY ANGIOGRAM (N/A) +/- PCI as a surgical intervention .    The patient's history has been reviewed, patient examined, notable improvement in clinical status since presentation with C-diff colitis related Sepsis-Septic Shock.  Now stable off of pressors --  stable for cardiac cath.  I have reviewed the patient's chart and labs.  Questions were answered to the patient's satisfaction.    Leonie Man  Cath Lab Visit (complete for each Cath Lab visit)  Clinical Evaluation Leading to the Procedure:   ACS: yes  Non-ACS:    Anginal Classification: CCS II  Anti-ischemic medical therapy: Minimal Therapy (1 class of medications)  Non-Invasive Test Results: No non-invasive testing performed  Prior CABG: No previous CABG

## 2013-05-01 NOTE — CV Procedure (Signed)
CARDIAC CATHETERIZATION and PERCUTANEOUS CORONARY INTERVENTION REPORT  NAME:  John R Enderle Sr.   MRN: [id number] DOB:  09-27-1950   ADMIT DATE: 04/22/2013 Procedure Date: 05/01/2013  INTERVENTIONAL CARDIOLOGIST: Leonie Man, M.D., MS PRIMARY CARE PROVIDER: Crisoforo Oxford, PA-C PRIMARY CARDIOLOGIST:  PATIENT:  [name] is a 63 y.o. male with a h/o CKD / ESRD (on HD following an accident with kidney injury) who presented on 04/22/2013 with CP & found to be in shock.  Bedside Echo initially revealed mild-moderately reduced EF, but no sign of cardiogenic shock.  He ruled in for NSTEMI with Troponin levels as high as 56.  Was diagnosed with C-Diff Colitis related Septic Shock.  Stabilized in the MICU & treated medically for his ~NSTEMI.  He is now hemodynamically stable off of pressors & stable for cardiac cath to delineate his coronary anatomy.  PRE-OPERATIVE DIAGNOSIS:    Non-STEMI  PROCEDURES PERFORMED:    Left Heart Catheterization with Coronary Angiography  Bifurcation PCI on circumflex-OM1-OM 2 -- with only successful PCI on the mid AV groove circumflex-OM 2 and loss of OM1  Aspiration thrombectomy  Rheolytic thrombectomy  PROCEDURE:Consent:  Risks of procedure as well as the alternatives and risks of each were explained to the (patient/caregiver).  Consent for procedure obtained. Consent for signed by MD and patient with RN witness -- placed on chart.   PROCEDURE: The patient was brought to the [facility] in the fasting state and prepped and draped in the usual sterile fashion for Right groin or radial access. A modified Allen's test with plethysmography was performed, revealing excellent Ulnar artery collateral flow.  Sterile technique was used including antiseptics, cap, gloves, gown, hand hygiene, mask and sheet.  Skin prep: Chlorhexidine.  Time Out: Verified patient identification, verified procedure, site/side was  marked, verified correct patient position, special equipment/implants available, medications/allergies/relevent history reviewed, required imaging and test results available.  Performed  Access: Right Common Femoral Artery; 5 Fr Sheath -- fluoroscopically guided modified Seldinger technique.  Diagnostic Left Heart Catheterization with Coronar  Angiography:  5 Fr  JL4, JR 4, Angled Pigtail., And EBU 3.5 catheters advanced and exchanged over standard J-wire into  ascending aorta and used for selective coronary artery engagement.  Left Coronary Artery Angiography: JL4, and XB 3.5  Right Coronary Artery Angiography: JR 4  LV Hemodynamics: Angled pigtail  Sheath:  Sutured in place, to be removed with manual pressure  Hemodynamics:  Central Aortic / Mean Pressures: 107/60 mmHg; 81 mmHg  Left Ventricular Pressures / EDP: 104/7 mmHg; 21 mmHg  Left Ventriculography: Not performed   Coronary Anatomy: Right dominant .  Left Main: Large-caliber vessel bifurcates into the LAD, and circumflex. Mild distal stenosis of roughly 30%. LAD: Large-caliber vessel reaching the apex. His rise to a least 3 diagonal branches. D1 has an ostial 90% stenosis but is too small for PCI and has flush ostial and 90 angle.  Left Circumflex: Large-caliber vessel that bifurcates in the mid AV groove into OM 1 and OM 2. OM1 is a smaller branch yet significant. The follow on AV groove circumflex continues on to the OM 2 which is a larger branch vessel that has 2 major branches. There is a true bifurcation 1, 1, 1 lesion noted at this stenosis was roughly 90%. The remainder the is free of significant sdisease.   RCA: Large-caliber vessel, dominant. It bifurcates distally into the RPDA and the  Right Posterior AV Groove Branch (RPAV); minimal  disease throughout. The RPDA the branches and 3 posterolateral branches; the PDA tapers and a small-caliber vessel at the apex.  Percutaneous Coronary Intervention:  Sheath exchanged  for 6 Fr  After reviewing the initial angiography, the culprit lesion was thought to be the bifurcation lesion in the circumflex.  Preparation were made to proceed with PCI on this lesion. Based on the appearance of lesion the thought would be that this would likely be a true bifurcation stenting procedure. The plan was to do a mini crush technique. Angiomax bolus was administered with drip continued, 600 mg Plavix was administered.  Initially both branches were wired and predilated with a 2.5 mm balloon. The ostium of the OM1 was then treated with AngioSculpt alone would unsatisfactory results, therefore the decision was made to proceed with the Mini Crush. A Xience Alpine 2.5 mm 50 mm DES stent was advanced into the OM1 with a segment into the mid circumflex, a tandem 3.5 mm by 15 mm [place] trek balloon was advanced beyond the bifurcation into the OM 2.   The stent was then deployed in the ostium of OM1, and postdilated with the stent balloon.  Next the 3.5 mm x 15 mm [place] trek balloon is pulled back to the bifurcation stenting to the OM 2-AV groove and proximal to the proximal edge of the stent. Unfortunately with inflation of this balloon it removed proximal to the stent, it could not be advanced forward. The stent was then postdilated with a 3.5 mm balloon, and the plan was then to switch to a stent within the stent technique, however the distal AV groove circumflex into OM 2 could not be successfully wired to the stent. In the process of performing this procedure, the patient started developing significant anginal pain, and angiography revealed extensive thrombus in the entire circumflex. An ACT was checked and was shown to be subtherapeutic at initial was therapeutic. ACT was 171 sec.  At this point, Aggrastat bolus was administered and 8000 units of heparin was administered to achieve an ACT of greater than 300 sec.  A proximal thrombectomy catheter was then advanced and 2 passes were made. The stent was  then postdilated to 4 mm with a 4 mm x 12 mm [place] trek balloon.  Unfortunately in the process the wire down the stent was pulled proximal to the stent.  Dr. Burt Knack scrubbed in to assist.  At this point, wires were passed down the main circumflex into the OM 2, with a loop in place to what appeared to be within the stent struts. We then passed a 2.25 mm x 12 mm followed by 3.5 mm balloon into the AV groove Circumflex-OM 2 segment to provide track through the stent. A 4-0 black balloon was then advanced with the intention of postdilating the thrombosed stent and the ostium of the follow on AV groove Circumflex-OM 2. At this point, there was significant residual thrombus still present.  Passes were made with an AngioJet RheolyticThrombectomy catheter - second run was cut short due to bradycardia. At this point it became apparent that are inflations were not within the stent struts but instead behind. At that point there appeared to be a possible dissection behind the stent into the follow on AV groove circumflex-OM 2. At this point incision was made to stent that went after a 3.0 mm x 20 mm balloon was inflated to tack up the stent dissection. A Xience Alpine 2.5 mm 20 mm stent was then advanced covering the proximal edge of  the previously placed stent and well into the follow on AV groove circumflex into OM 2.   The stent was then postdilated to 4.5 mm, despite this excellent stent deployment, the ostium to the OM 1 was now lost and with multiple wires were on able to be wire that vessel.    This point, after multiple inflations, prolonged case of multiple wires, the decision was made to abort the rest of the procedure and except for loss of OM1.  An addendum report will be added with balloon inflations.  Post deployment angiography in multiple views, with and without guidewire in place revealed excellent stent deployment and lesion coverage of the mid AV groove circumflex into OM 2, but with flush  occlusion of the stented OM1 segment.    MEDICATIONS:  Anesthesia:  Local Lidocaine 16 ml  Sedation:  6 mg IV Versed, 250 mcg IV fentanyl ; Dilaudid 1 mg  Omnipaque Contrast: 430 ml  Anticoagulation: Angiomax Bolus & drip  Converted to IV heparin an initial bolus of 8000+ additional 3000 units to achieve an ACT of greater than 300 sec  Anti-Platelet Agent:  Plavix 600 mg;   Aggrastat bolus and drip plan in-stent thrombosis noted  PATIENT DISPOSITION:    The patient was transferred to the PACU holding area in a hemodynamicaly stable, chest pain free condition.  The patient tolerated the procedure well, and there were no complications.  EBL:   < 40 ml  The patient was stable before, during, and after the procedure.  POST-OPERATIVE DIAGNOSIS:    Severe 2 site CAD with ostial D1 99% lesion as well as a true bifurcation OM1/AV groove circumflex (1, 1, 1) lesion.  Aggrastat nonresponder with severe in-stent thrombosis  Suboptimal yet successful resolution of severe in-stent thrombosis with both catheter aspiration thrombectomy as well as rheolytic thrombectomy.  Successful DES stent placement in the mid Circumflex into the AV groove-OM 2 Circumflex, with unfortunate jailing of the originally stented OM1 resulting in the complete occlusion of OM1.  Unable to rewire through stent struts.  Xience Alpine 3.5 mm x 23 mm: Postdilated to 4.5 mm  PLAN OF CARE:  The patient was transferred to the CCU for ongoing care. He will be treated medically for his ongoing infarct of the OM 1.  He will need another echocardiogram done prior to discharge  Will continue Aggrastat for 18 hours, and continue dual therapy for minimal in year  Continue other cardiac risk factor modification.   Leonie Man, M.D., M.S. [facility] [address] [place], [place]  [address]  [phone]  05/01/2013 9:39 AM

## 2013-05-01 NOTE — Progress Notes (Signed)
Subjective: +CP, had heart cath today.  Per then notes pt developed acute thrombosis during the procedure, not amenable to Rx, and one of the vessels (OM1) was lost to acute MI. The other lesion was successfully stented.   Filed Vitals:   04/30/13 2047 05/01/13 0523 05/01/13 0923 05/01/13 1500  BP: 111/54 108/51  117/59  Pulse: 65 69 57 65  Temp: 98.7 F (37.1 C) 98.5 F (36.9 C)  98.2 F (36.8 C)  TempSrc: Oral Oral  Oral  Resp: 22 20  16   Height:      Weight:  114.443 kg (252 lb 4.8 oz)    SpO2: 98% 100%  98%   Exam: Alert, no distress Chest clear bilat RRR no MRG Abd soft, NTND Trace bilat LE edema Neuro is nf, ox3 RUA AVF patent   Dialysis: Home hemodialysis 5d per week, average 3-4 hrs depending on fluid gain.  RUA AVF. 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  HD tomorrow, low dose heparin    Kelly Splinter MD  pager [phone]    cell [phone]  05/01/2013, 6:08 PM     Recent Labs Lab 04/29/13 1600 04/30/13 0415 05/01/13 0500 05/01/13 1618  NA 133* 133* 137  --   K 5.2 4.9 5.0  --   CL 99 101 101  --   CO2 23 23 20   --   GLUCOSE 111* 124* 96  --   BUN 31* 46* 73*  --   CREATININE 2.92* 4.28* 6.41* 6.53*  CALCIUM 8.9 9.3 9.3  --   PHOS 3.2 4.4 7.6*  --     Recent Labs Lab 04/29/13 1600 04/30/13 0415 05/01/13 0500  AST  --  15  --   ALT  --  18  --   ALKPHOS  --  90  --   BILITOT  --  0.3  --   PROT  --  5.9*  --   ALBUMIN 2.5* 2.6* 2.7*    Recent Labs Lab 04/30/13 0415 05/01/13 0500 05/01/13 1618  WBC 32.2* 25.2* 21.1*  HGB 9.0* 9.0* 8.8*  HCT 28.7* 27.6* 27.2*  MCV 95.0 95.2 94.8  PLT 191 195 215   . aspirin  325 mg Oral Daily  . [START ON 05/02/2013] clopidogrel  75  mg Oral Q breakfast  . darbepoetin (ARANESP) injection - DIALYSIS  100 mcg Intravenous Q Wed-HD  . heparin  5,000 Units Subcutaneous 3 times per day  . hydrocortisone sodium succinate  50 mg Intravenous 4 times per day  . midodrine  10 mg Oral TID WC  . multivitamin  1 tablet Oral QHS  . rOPINIRole  1 mg Oral QHS  . sodium chloride  10-40 mL Intracatheter Q12H  . vancomycin  500 mg Oral 4 times per day   . heparin 10,000 units/ 20 mL infusion syringe Stopped (04/29/13 0816)  . tirofiban 0.075 mcg/kg/min (05/01/13 1500)   acetaminophen, fentaNYL, heparin, heparin, morphine injection, ondansetron (ZOFRAN) IV, promethazine, sodium chloride, sodium chloride

## 2013-05-01 NOTE — Progress Notes (Signed)
ACT = 105, Pt informed of sheath removal, pt verbalized understanding. Manual pressure applied to right groin for 20 min without any problems. Site soft, no hematoma. VSS during sheath pull. Post cath instructions given to pt, pt verbalized understanding. Will monitor closely

## 2013-05-01 NOTE — Progress Notes (Signed)
TELEMETRY: Reviewed telemetry pt in NSR: Filed Vitals:   04/30/13 1500 04/30/13 1611 04/30/13 2047 05/01/13 0523  BP:  89/49 111/54 108/51  Pulse: 67  65 69  Temp:  98.6 F (37 C) 98.7 F (37.1 C) 98.5 F (36.9 C)  TempSrc:   Oral Oral  Resp: 21 20 22 20   Height:      Weight:    252 lb 4.8 oz (114.443 kg)  SpO2: 93% 95% 98% 100%    Intake/Output Summary (Last 24 hours) at 05/01/13 0721 Last data filed at 04/30/13 2310  Gross per 24 hour  Intake    930 ml  Output    100 ml  Net    830 ml    SUBJECTIVE Feels well today. Denies any SOB or chest pain. Bowels formed. No edema.  LABS: Basic Metabolic Panel:  Recent Labs  04/29/13 0430  04/29/13 1600 04/30/13 0415  NA  --   < > 133* 133*  K  --   < > 5.2 4.9  CL  --   < > 99 101  CO2  --   < > 23 23  GLUCOSE  --   < > 111* 124*  BUN  --   < > 31* 46*  CREATININE  --   < > 2.92* 4.28*  CALCIUM  --   < > 8.9 9.3  MG 2.4  --   --  2.6*  PHOS  --   < > 3.2 4.4  < > = values in this interval not displayed. Liver Function Tests:  Recent Labs  04/29/13 1600 04/30/13 0415  AST  --  15  ALT  --  18  ALKPHOS  --  90  BILITOT  --  0.3  PROT  --  5.9*  ALBUMIN 2.5* 2.6*   No results found for this basename: LIPASE, AMYLASE,  in the last 72 hours CBC:  Recent Labs  04/29/13 0430 04/30/13 0415  WBC 27.6* 32.2*  HGB 9.1* 9.0*  HCT 29.1* 28.7*  MCV 96.0 95.0  PLT 144* 191   Cardiac Enzymes: No results found for this basename: CKTOTAL, CKMB, CKMBINDEX, TROPONINI,  in the last 72 hours Thyroid Function Tests:  Recent Labs  04/30/13 0410  TSH 1.530   Radiology/Studies:  Ct Abdomen Pelvis W Contrast  04/23/2013   CLINICAL DATA:  Worsening hypovolemic shock and history of end-stage renal disease.  EXAM: CT ABDOMEN AND PELVIS WITH CONTRAST  TECHNIQUE: Multidetector CT imaging of the abdomen and pelvis was performed using the standard protocol following bolus administration of intravenous contrast.  CONTRAST:   150mL OMNIPAQUE IOHEXOL 300 MG/ML  SOLN  COMPARISON:  DG CHEST 1V PORT dated 04/23/2013; DG CHEST 1V PORT dated 04/23/2013  FINDINGS: The visualized lung bases show dense consolidation of both lower lobes, right greater than left. There is a trace amount of right pleural fluid. Air bronchograms are present in areas of consolidation and findings are consistent with pneumonia and potentially aspiration.  The liver demonstrates cirrhotic changes without mass or biliary dilatation. There is a small rim of fluid anterior to the liver. The spleen, pancreas, adrenal glands and kidneys are unremarkable.  The colon is decompressed but demonstrates evidence of probable wall thickening and he see inflammatory change, especially at the level of the ascending colon and proximal transverse colon. Findings are suggestive of colitis. There is no evidence of free intraperitoneal air or focal abscess. Some mild edema and stranding is noted trauma to the retroperitoneum.  There also is some thickening and inflammatory change involving the transverse duodenum that abuts the head of the pancreas. Correlation suggested with any possibility of pancreatitis. No peripancreatic fluid collections are identified.  No vascular abnormalities seen. No focal masses or enlarged lymph nodes are seen. The bladder is decompressed. Bony structures are unremarkable.  IMPRESSION: 1. Pulmonary consolidation with air bronchograms in both lower lobes, right greater than left. Findings are consistent with pneumonia and potentially aspiration. 2. Decompressed but thickened appearance of the colon may be consistent with colitis. There is no evidence of bowel perforation. 3. Diffuse inflammatory stranding throughout the retroperitoneum with some thickening inflammatory change involving the transverse duodenum that abuts the head of the pancreas. This may represent changes related to peptic ulcer disease/duodenitis or pancreatitis. 4. Cirrhotic changes of the  liver.   Electronically Signed   By: Aletta Edouard M.D.   On: 04/23/2013 22:38   Dg Chest Port 1 View  04/28/2013   CLINICAL DATA:  Reassess effusions  EXAM: PORTABLE CHEST - 1 VIEW  COMPARISON:  DG CHEST 1V PORT dated 04/26/2013  FINDINGS: The lungs are adequately inflated. Small bilateral pleural effusions blunt the costophrenic angles. The cardiopericardial silhouette is enlarged. The pulmonary vascularity is engorged. The pulmonary interstitial markings are increased but have improved since yesterday's study. The left internal jugular venous catheter tip lies in region of the proximal SVC.  IMPRESSION: There has been slight interval improvement in the appearance of the pulmonary interstitium since the earlier study. CHF persists. There small bilateral pleural effusions.   Electronically Signed   By: David  Martin[place]   On: 04/28/2013 07:53   Dg Chest Port 1 View  04/26/2013   CLINICAL DATA:  Respiratory failure  EXAM: PORTABLE CHEST - 1 VIEW  COMPARISON:  April 25, 2013  FINDINGS: Central catheter tip is in the superior vena cava. No pneumothorax. There is mild interstitial edema with small pleural effusions bilaterally. There is moderate cardiomegaly with pulmonary venous hypertension. There is patchy atelectasis in the left base. No adenopathy.  IMPRESSION: Evidence of a degree of congestive heart failure. Patchy atelectasis left base. No pneumothorax.   Electronically Signed   By: Lowella Grip M.D.   On: 04/26/2013 07:05   Dg Chest Port 1 View  04/25/2013   CLINICAL DATA:  Suspect acute pulmonary edema  EXAM: PORTABLE CHEST - 1 VIEW  COMPARISON:  DG CHEST 1V PORT dated 04/24/2013  FINDINGS: The lungs are adequately inflated. The interstitial markings remain increased. The pulmonary vascularity is prominent and has increased slightly in conspicuity on the left since yesterday's study. The cardiopericardial silhouette is enlarged. The right internal jugular venous catheter tip lies in the region of  the junction of the right and left brachiocephalic veins. Small amounts of pleural fluid blunting the costophrenic angles.  IMPRESSION: The appearance of the thorax is consistent with the clinically suspected pulmonary edema. There has been slight interval deterioration since yesterday's study. Small bilateral pleural effusions are present. These results were called by telephone at the time of interpretation on 04/25/2013 at 9:58 AM to Audie Pinto, RN,, who verbally acknowledged these results.   Electronically Signed   By: David  [place]   On: 04/25/2013 09:59   Dg Chest Port 1 View  04/24/2013   CLINICAL DATA:  Hypoxia  EXAM: PORTABLE CHEST - 1 VIEW  COMPARISON:  DG CHEST 1V PORT dated 04/23/2013  FINDINGS: There is increased bilateral interstitial thickening and prominence of the central pulmonary vasculature. There are bilateral small  pleural effusions. There is no pneumothorax. Stable cardiomegaly. Left jugular central venous catheter in unchanged position. The osseous structures are unremarkable.  IMPRESSION: Overall findings are concerning for worsening pulmonary edema.   Electronically Signed   By: Kathreen Devoid   On: 04/24/2013 04:09   Dg Chest Port 1 View  04/23/2013   CLINICAL DATA:  Pulmonary edema  EXAM: PORTABLE CHEST - 1 VIEW  COMPARISON:  DG CHEST 1V PORT dated 04/23/2013; DG CHEST 1V PORT dated 04/22/2013; CT ANGIO CHEST AORTA W/CM & WO/CM dated 04/22/2013; DG CHEST 1V PORT dated 04/22/2013; DG CHEST 2 VIEW dated 12/23/2010  FINDINGS: Grossly unchanged enlarged cardiac silhouette and mediastinal contours. Stable positioning of support apparatus. The pulmonary vasculature remains indistinct with cephalization of flow. There is chronic blunting of the left costophrenic angle without definite pleural effusion. No pneumothorax. A vascular stent overlies right lung apex. Unchanged bones.  IMPRESSION: Grossly unchanged findings of cardiomegaly and mild pulmonary edema.   Electronically Signed   By: Sandi Mariscal M.D.   On: 04/23/2013 08:08   Dg Chest Port 1 View  04/23/2013   CLINICAL DATA:  Central line placement.  EXAM: PORTABLE CHEST - 1 VIEW  COMPARISON:  04/22/2013  FINDINGS: Interval placement of a left central venous catheter. The tip is localized over the mid SVC region and directed somewhat laterally, likely at or near the origin of the brachiocephalic vein. No pneumothorax. Cardiac enlargement with small left pleural effusion. Pulmonary vascularity is normal. Vascular stent projected over the right subclavian region.  IMPRESSION: Left central venous catheter tip localizes over the mid SVC, directed somewhat laterally. Persistent cardiac enlargement and small pleural effusions.   Electronically Signed   By: Lucienne Capers M.D.   On: 04/23/2013 00:51   Dg Chest Port 1 View  04/22/2013   CLINICAL DATA:  post line placement post line placement  EXAM: PORTABLE CHEST - 1 VIEW  COMPARISON:  None.  FINDINGS: Low lung volumes. The cardiac silhouette is enlarged. Central venous catheter tip appreciated in the region of distal right internal jugular vein. Repositioning and advancement is recommended. There is no evidence of pneumothorax. The lungs are clear. The osseous structures demonstrate no acute abnormalities. Mild degenerative changes of the right shoulder.  IMPRESSION: Repositioning of the patient's right internal jugular catheter is recommended. These findings were discussed with Dr. Stark Jock of the emergency department who has informed the patient's critical care attending of these findings.  No acute cardiopulmonary disease.   Electronically Signed   By: Margaree Mackintosh M.D.   On: 04/22/2013 20:16   Dg Chest Portable 1 View  04/22/2013   CLINICAL DATA:  Generalized body aches and emesis  EXAM: PORTABLE CHEST - 1 VIEW  COMPARISON:  Chest radiograph 04/22/2013 and 12/23/2010  FINDINGS: Right subclavian vascular stent noted. Numerous cardiac leads project over the chest. Cardiopericardial silhouette  appears enlarged, similar compared to today's earlier chest radiograph. Please note the heart size has increased since the prior study of 2012, for which pericardial effusion cannot be excluded. There is pulmonary vascular congestion. No definite pulmonary edema. Negative for consolidation.  IMPRESSION: No significant change compared to the radiograph performed earlier today. Assistant cardiomegaly and mild prominence of the pulmonary vascularity.   Electronically Signed   By: Curlene Dolphin M.D.   On: 04/22/2013 19:31   Dg Chest Port 1 View  04/22/2013   CLINICAL DATA:  fever, weakness, hypotension  EXAM: PORTABLE CHEST - 1 VIEW  COMPARISON:  DG CHEST 2 VIEW  dated 12/23/2010  FINDINGS: The film was taken in a lordotic projection. The lung volumes are mildly decreased. The cardiopericardial silhouette is markedly enlarged as compared to the previous study. There is no definite pleural effusion but basilar atelectasis is suspected especially on the left. The pulmonary vascularity is minimally prominent centrally.  IMPRESSION: There is bilateral pulmonary hyperinflation with new enlargement of the cardiac silhouette. The findings may reflect congestive heart failure. A pericardial effusion is not excluded.   Electronically Signed   By: David  [place]   On: 04/22/2013 17:07   Ct Angio Chest Aorta W/cm &/or Wo/cm  04/22/2013   CLINICAL DATA:  Rule out dissection  EXAM: CT ANGIOGRAPHY CHEST WITH CONTRAST  TECHNIQUE: Multidetector CT imaging of the chest was performed using the standard protocol during bolus administration of intravenous contrast. Multiplanar CT image reconstructions and MIPs were obtained to evaluate the vascular anatomy.  CONTRAST:  193mL OMNIPAQUE IOHEXOL 350 MG/ML SOLN  COMPARISON:  DG CHEST 1V PORT dated 04/22/2013  FINDINGS: A 3 cm left thyroid nodule is appreciated. Further evaluation with nonemergent thyroid ultrasound recommended. A right subclavian vein stent is appreciated. The thoracic  inlet is otherwise unremarkable.  No mediastinal or hilar masses or adenopathy is appreciated. Atherosclerotic calcifications identified within the coronary arteries. The heart is enlarged.  There is no evidence of a thoracic aortic aneurysm nor dissection. There are no filling defects within the main, lobar, or segmental pulmonary arteries.  There areas of diffuse interlobular and subpleural septal thickening in the posterior upper lobes and lung bases. There also linear densities within the lung bases. More focal confluent areas of increased density are appreciated within the posterior dependent portions of the lung bases.  Partial visualization of the liver demonstrates subcapsular oval-shaped areas of low attenuation along the right lobe of the liver. Remaining visualized upper abdominal viscera are unremarkable.  No aggressive appearing osseous lesions are appreciated.  Review of the MIP images confirms the above findings.  IMPRESSION: 1. No CT evidence of a thoracic aortic aneurysm nor dissection. There is no evidence of pulmonary arterial embolic disease 2. Findings consistent with interstitial fibrosis. There regions of scarring versus atelectasis within the lung bases. Mild infiltrates within the posterior dependent portions of the lungs left greater than right cannot be excluded. 3. Indeterminate subcapsular low attenuating areas along the right lobe of the liver. The patient has a history of prior intra-abdominal surgery and these findings may represent postoperative changes. Clinical correlation recommended. 4. Left thyroid nodules further evaluation with nonemergent thyroid ultrasound recommended.   Electronically Signed   By: Margaree Mackintosh M.D.   On: 04/22/2013 21:16   Ecg 04/23/13: NSR, NML.  Echo: Study Conclusions  - Left ventricle: The cavity size was normal. Wall thickness was normal. Systolic function was normal. The estimated ejection fraction was in the range of 55% to 60%.  Left ventricular diastolic function parameters were normal. - Left atrium: The atrium was mildly dilated.    PHYSICAL EXAM General: Well developed, obese, in no acute distress. Head: Normocephalic, atraumatic, sclera non-icteric, no xanthomas, nares are without discharge. Neck: Negative for carotid bruits. JVD not elevated. Lungs: Clear bilaterally to auscultation without wheezes, rales, or rhonchi. Breathing is unlabored. Heart: RRR S1 S2 without murmurs, rubs, or gallops.  Abdomen: Soft, non-tender, non-distended with normoactive bowel sounds. No hepatomegaly. No rebound/guarding. No obvious abdominal masses. Msk:  Strength and tone appears normal for age. Extremities: No clubbing, cyanosis or edema.  Distal pedal pulses are 2+ and equal  bilaterally. AV fistula RUE. Neuro: Alert and oriented X 3. Moves all extremities spontaneously. Psych:  Responds to questions appropriately with a normal affect.  ASSESSMENT AND PLAN: 1. NSTEMI- peak troponin 11.84. ? Primary event versus related to severe sepsis and hypotension. Will proceed with cardiac cath today to assess coronary anatomy, risk, and treatment course. The procedure and risks were reviewed including but not limited to death, myocardial infarction, stroke, arrythmias, bleeding, transfusion, emergency surgery, dye allergy, or renal dysfunction. The patient voices understanding and is agreeable to proceed. 2. Septic shock with hypotension. WBC still elevated but otherwise improved.  3. Acute respiratory failure with pulmonary edema. Normal LV function by last Echo.  4. Hypotension. On midodrine. 5. ESRD on HD. For dialysis later today after cath. 6. C. Diff colitis. 7. Chronic anemia 8. Possible adrenal insufficiency. On steroids.    Principal Problem:   C. difficile colitis Active Problems:   CKD (chronic kidney disease) stage V requiring chronic dialysis   Septic shock(785.52)   Severe sepsis(995.92)    Signed, Thomasa Heidler M [place]  MD,FACC 05/01/2013 7:28 AM

## 2013-05-01 NOTE — Progress Notes (Signed)
PULMONARY / CRITICAL CARE MEDICINE  Name: John R Flaim Sr. MRN: [id number] DOB: [birth year]    ADMISSION DATE:  04/22/2013 CONSULTATION DATE:  04/22/2013  REFERRING MD :  EDP PRIMARY SERVICE: PCCM  CHIEF COMPLAINT:  Chest pain  BRIEF PATIENT DESCRIPTION:  63 yo ESRD on HD who presented on 4/11 with chest pain and hypotension.  SIGNIFICANT EVENTS / STUDIES:  4/11  Chest CTA >>> NO dissection, NO embolus, IPF, thyroid nodules 4/11  TTE >>> EF 40% 4/12  TTE >>> LVEF 89-38%; normal diastolic function; NO pericardial effusion 4/12  CT abdomen >>> Bibasilar airspace disease, possible colitis without perforation, possible duodenitis / pancreatitis 4/13  CVVH started 4/15  Off vasopressors 4/16  CRRT to continue through day with plans to discontinue tomorrow and attempt IHD 4/19.  4/18  continued hypoglcyemia 4/19  Transfer to telemetry 4/20:   LINES / TUBES: L Radial A-line 4/12 >> 4/17 R IJ TLC 4/11 >>  R fem HD cath 4/13 >>   CULTURES: 4/11 MRSA PCR >>> neg 4/11 Blood >> NEG 4/13 C.diff >>> POSITIVE  ANTIBIOTICS: Vancomycin IV 4/11>>>4/14 Ceftazidime 4/12 >>> 4/14 Flagyl IV 4/13 >> 4/17 Vancomycin PO 4/13 >>   INTERVAL HISTORY:  Seen post cath, complains of CP.  VITAL SIGNS: Temp:  [98.5 F (36.9 C)-98.7 F (37.1 C)] 98.5 F (36.9 C) (04/20 0523) Pulse Rate:  [65-69] 69 (04/20 0523) Resp:  [20-22] 20 (04/20 0523) BP: (89-111)/(49-54) 108/51 mmHg (04/20 0523) SpO2:  [93 %-100 %] 100 % (04/20 0523) Weight:  [114.443 kg (252 lb 4.8 oz)] 114.443 kg (252 lb 4.8 oz) (04/20 0523)  HEMODYNAMICS:    INTAKE / OUTPUT: Intake/Output     04/19 0701 - 04/20 0700 04/20 0701 - 04/21 0700   P.O. 630 0   I.V. (mL/kg) 300 (2.6)    Total Intake(mL/kg) 930 (8.1)    Urine (mL/kg/hr)     Other     Stool 100 (0)    Total Output 100     Net +830 0        Urine Occurrence 4 x    Stool Occurrence 1 x     PHYSICAL EXAMINATION: General: in bed, mild painful distress from chest  pain. Neuro: Lethargic but arousable, moves all ext to command. HEENT: Lt IJ CVL site clean, line in place. Cardiovascular: RRR, Nl S1/S2, -M/R/G. Lungs: bibasilar crackles. Abdomen: soft, non tender, ND and +BS. Ext: no edema  LABS: CBC Recent Labs     04/29/13  0430  04/30/13  0415  05/01/13  0500  WBC  27.6*  32.2*  25.2*  HGB  9.1*  9.0*  9.0*  HCT  29.1*  28.7*  27.6*  PLT  144*  191  195    Coag's Recent Labs     04/29/13  1600  04/30/13  0415  05/01/13  0500  APTT  32  32  29    BMET Recent Labs     04/29/13  1600  04/30/13  0415  05/01/13  0500  NA  133*  133*  137  K  5.2  4.9  5.0  CL  99  101  101  CO2  23  23  20   BUN  31*  46*  73*  CREATININE  2.92*  4.28*  6.41*  GLUCOSE  111*  124*  96    Electrolytes Recent Labs     04/29/13  0430   04/29/13  1600  04/30/13  0415  05/01/13  0500  CALCIUM   --    < >  8.9  9.3  9.3  MG  2.4   --    --   2.6*   --   PHOS   --    < >  3.2  4.4  7.6*   < > = values in this interval not displayed.   Liver Enzymes Recent Labs     04/29/13  1600  04/30/13  0415  05/01/13  0500  AST   --   15   --   ALT   --   18   --   ALKPHOS   --   90   --   BILITOT   --   0.3   --   ALBUMIN  2.5*  2.6*  2.7*   Glucose Recent Labs     04/30/13  0408  04/30/13  0727  04/30/13  1141  04/30/13  1738  04/30/13  2349  05/01/13  0607  GLUCAP  118*  123*  106*  102*  110*  91    Imaging No results found.  ASSESSMENT / PLAN:  Septic shock 2nd to C diff, superimposed on Chronic hypotension. NSTEMI w/ severe CAD involving Circ S/p left heart cath and DES placement 4/20, but ongoing complete occlusion of OM1   P:  - Transfer back to the ICU given cardiac cath results noted in cardiology procedure note and hold overnight per their recommendations. - Avoid pressors if able.  - If becomes  Hypotensive then will need assessment of CVP (TLC in place) and cardiology to be called. - Continue ASA. - Cont  aggrastat w/ plan for dual antiplatelet therapy for 1 yr s/p discharge. - Needs echo prior to d/c per cards recommendations. - Continue midodrine and stress dose steroids (chronically hypotensive).  Once this acute event is over will consider lowering stress dose steroids. - Heparin per cardiology.  ESRD. P:  - HD per renal. - Replace electrolytes as indicated. - BMET in AM.  Anemia of critical illness, and chronic disease. P:  - SCD's for DVT prevention - F/u CBC - Transfuse for Hb < 8 given CAD. - Heparin and anti-plat per cards recommendations.  Septic shock 2nd to C diff colitis. P:  Day 9/14 of oral vancomycin, continue.  Presumed adrenal insufficiency. Hypoglycemia >> resolved 4/19. P:  - Taper steroids. - Continue to monitor CBG's q6h.  Hold ambulation for now given CAD.  Recommendations for anti-coag per cardiology.  Echo once CP is improves.  Will avoid nitro for now given low BP.  If CP worsens then cards would like to be notified.  Hold in ICU for observation for now.  CC time 35 min.  Rush Farmer, M.D. [facility] Pulmonary/Critical Care Medicine. Pager: [phone]. After hours pager: [phone].

## 2013-05-01 NOTE — H&P (View-Only) (Signed)
TELEMETRY: Reviewed telemetry pt in NSR: Filed Vitals:   04/30/13 1500 04/30/13 1611 04/30/13 2047 05/01/13 0523  BP:  89/49 111/54 108/51  Pulse: 67  65 69  Temp:  98.6 F (37 C) 98.7 F (37.1 C) 98.5 F (36.9 C)  TempSrc:   Oral Oral  Resp: 21 20 22 20   Height:      Weight:    252 lb 4.8 oz (114.443 kg)  SpO2: 93% 95% 98% 100%    Intake/Output Summary (Last 24 hours) at 05/01/13 0721 Last data filed at 04/30/13 2310  Gross per 24 hour  Intake    930 ml  Output    100 ml  Net    830 ml    SUBJECTIVE Feels well today. Denies any SOB or chest pain. Bowels formed. No edema.  LABS: Basic Metabolic Panel:  Recent Labs  04/29/13 0430  04/29/13 1600 04/30/13 0415  NA  --   < > 133* 133*  K  --   < > 5.2 4.9  CL  --   < > 99 101  CO2  --   < > 23 23  GLUCOSE  --   < > 111* 124*  BUN  --   < > 31* 46*  CREATININE  --   < > 2.92* 4.28*  CALCIUM  --   < > 8.9 9.3  MG 2.4  --   --  2.6*  PHOS  --   < > 3.2 4.4  < > = values in this interval not displayed. Liver Function Tests:  Recent Labs  04/29/13 1600 04/30/13 0415  AST  --  15  ALT  --  18  ALKPHOS  --  90  BILITOT  --  0.3  PROT  --  5.9*  ALBUMIN 2.5* 2.6*   No results found for this basename: LIPASE, AMYLASE,  in the last 72 hours CBC:  Recent Labs  04/29/13 0430 04/30/13 0415  WBC 27.6* 32.2*  HGB 9.1* 9.0*  HCT 29.1* 28.7*  MCV 96.0 95.0  PLT 144* 191   Cardiac Enzymes: No results found for this basename: CKTOTAL, CKMB, CKMBINDEX, TROPONINI,  in the last 72 hours Thyroid Function Tests:  Recent Labs  04/30/13 0410  TSH 1.530   Radiology/Studies:  Ct Abdomen Pelvis W Contrast  04/23/2013   CLINICAL DATA:  Worsening hypovolemic shock and history of end-stage renal disease.  EXAM: CT ABDOMEN AND PELVIS WITH CONTRAST  TECHNIQUE: Multidetector CT imaging of the abdomen and pelvis was performed using the standard protocol following bolus administration of intravenous contrast.  CONTRAST:   162mL OMNIPAQUE IOHEXOL 300 MG/ML  SOLN  COMPARISON:  DG CHEST 1V PORT dated 04/23/2013; DG CHEST 1V PORT dated 04/23/2013  FINDINGS: The visualized lung bases show dense consolidation of both lower lobes, right greater than left. There is a trace amount of right pleural fluid. Air bronchograms are present in areas of consolidation and findings are consistent with pneumonia and potentially aspiration.  The liver demonstrates cirrhotic changes without mass or biliary dilatation. There is a small rim of fluid anterior to the liver. The spleen, pancreas, adrenal glands and kidneys are unremarkable.  The colon is decompressed but demonstrates evidence of probable wall thickening and he see inflammatory change, especially at the level of the ascending colon and proximal transverse colon. Findings are suggestive of colitis. There is no evidence of free intraperitoneal air or focal abscess. Some mild edema and stranding is noted trauma to the retroperitoneum.  There also is some thickening and inflammatory change involving the transverse duodenum that abuts the head of the pancreas. Correlation suggested with any possibility of pancreatitis. No peripancreatic fluid collections are identified.  No vascular abnormalities seen. No focal masses or enlarged lymph nodes are seen. The bladder is decompressed. Bony structures are unremarkable.  IMPRESSION: 1. Pulmonary consolidation with air bronchograms in both lower lobes, right greater than left. Findings are consistent with pneumonia and potentially aspiration. 2. Decompressed but thickened appearance of the colon may be consistent with colitis. There is no evidence of bowel perforation. 3. Diffuse inflammatory stranding throughout the retroperitoneum with some thickening inflammatory change involving the transverse duodenum that abuts the head of the pancreas. This may represent changes related to peptic ulcer disease/duodenitis or pancreatitis. 4. Cirrhotic changes of the  liver.   Electronically Signed   By: Aletta Edouard M.D.   On: 04/23/2013 22:38   Dg Chest Port 1 View  04/28/2013   CLINICAL DATA:  Reassess effusions  EXAM: PORTABLE CHEST - 1 VIEW  COMPARISON:  DG CHEST 1V PORT dated 04/26/2013  FINDINGS: The lungs are adequately inflated. Small bilateral pleural effusions blunt the costophrenic angles. The cardiopericardial silhouette is enlarged. The pulmonary vascularity is engorged. The pulmonary interstitial markings are increased but have improved since yesterday's study. The left internal jugular venous catheter tip lies in region of the proximal SVC.  IMPRESSION: There has been slight interval improvement in the appearance of the pulmonary interstitium since the earlier study. CHF persists. There small bilateral pleural effusions.   Electronically Signed   By: David  Martinique   On: 04/28/2013 07:53   Dg Chest Port 1 View  04/26/2013   CLINICAL DATA:  Respiratory failure  EXAM: PORTABLE CHEST - 1 VIEW  COMPARISON:  April 25, 2013  FINDINGS: Central catheter tip is in the superior vena cava. No pneumothorax. There is mild interstitial edema with small pleural effusions bilaterally. There is moderate cardiomegaly with pulmonary venous hypertension. There is patchy atelectasis in the left base. No adenopathy.  IMPRESSION: Evidence of a degree of congestive heart failure. Patchy atelectasis left base. No pneumothorax.   Electronically Signed   By: Lowella Grip M.D.   On: 04/26/2013 07:05   Dg Chest Port 1 View  04/25/2013   CLINICAL DATA:  Suspect acute pulmonary edema  EXAM: PORTABLE CHEST - 1 VIEW  COMPARISON:  DG CHEST 1V PORT dated 04/24/2013  FINDINGS: The lungs are adequately inflated. The interstitial markings remain increased. The pulmonary vascularity is prominent and has increased slightly in conspicuity on the left since yesterday's study. The cardiopericardial silhouette is enlarged. The right internal jugular venous catheter tip lies in the region of  the junction of the right and left brachiocephalic veins. Small amounts of pleural fluid blunting the costophrenic angles.  IMPRESSION: The appearance of the thorax is consistent with the clinically suspected pulmonary edema. There has been slight interval deterioration since yesterday's study. Small bilateral pleural effusions are present. These results were called by telephone at the time of interpretation on 04/25/2013 at 9:58 AM to Audie Pinto, RN,, who verbally acknowledged these results.   Electronically Signed   By: David  Martin[place]   On: 04/25/2013 09:59   Dg Chest Port 1 View  04/24/2013   CLINICAL DATA:  Hypoxia  EXAM: PORTABLE CHEST - 1 VIEW  COMPARISON:  DG CHEST 1V PORT dated 04/23/2013  FINDINGS: There is increased bilateral interstitial thickening and prominence of the central pulmonary vasculature. There are bilateral small  pleural effusions. There is no pneumothorax. Stable cardiomegaly. Left jugular central venous catheter in unchanged position. The osseous structures are unremarkable.  IMPRESSION: Overall findings are concerning for worsening pulmonary edema.   Electronically Signed   By: Kathreen Devoid   On: 04/24/2013 04:09   Dg Chest Port 1 View  04/23/2013   CLINICAL DATA:  Pulmonary edema  EXAM: PORTABLE CHEST - 1 VIEW  COMPARISON:  DG CHEST 1V PORT dated 04/23/2013; DG CHEST 1V PORT dated 04/22/2013; CT ANGIO CHEST AORTA W/CM & WO/CM dated 04/22/2013; DG CHEST 1V PORT dated 04/22/2013; DG CHEST 2 VIEW dated 12/23/2010  FINDINGS: Grossly unchanged enlarged cardiac silhouette and mediastinal contours. Stable positioning of support apparatus. The pulmonary vasculature remains indistinct with cephalization of flow. There is chronic blunting of the left costophrenic angle without definite pleural effusion. No pneumothorax. A vascular stent overlies right lung apex. Unchanged bones.  IMPRESSION: Grossly unchanged findings of cardiomegaly and mild pulmonary edema.   Electronically Signed   By: Sandi Mariscal M.D.   On: 04/23/2013 08:08   Dg Chest Port 1 View  04/23/2013   CLINICAL DATA:  Central line placement.  EXAM: PORTABLE CHEST - 1 VIEW  COMPARISON:  04/22/2013  FINDINGS: Interval placement of a left central venous catheter. The tip is localized over the mid SVC region and directed somewhat laterally, likely at or near the origin of the brachiocephalic vein. No pneumothorax. Cardiac enlargement with small left pleural effusion. Pulmonary vascularity is normal. Vascular stent projected over the right subclavian region.  IMPRESSION: Left central venous catheter tip localizes over the mid SVC, directed somewhat laterally. Persistent cardiac enlargement and small pleural effusions.   Electronically Signed   By: Lucienne Capers M.D.   On: 04/23/2013 00:51   Dg Chest Port 1 View  04/22/2013   CLINICAL DATA:  post line placement post line placement  EXAM: PORTABLE CHEST - 1 VIEW  COMPARISON:  None.  FINDINGS: Low lung volumes. The cardiac silhouette is enlarged. Central venous catheter tip appreciated in the region of distal right internal jugular vein. Repositioning and advancement is recommended. There is no evidence of pneumothorax. The lungs are clear. The osseous structures demonstrate no acute abnormalities. Mild degenerative changes of the right shoulder.  IMPRESSION: Repositioning of the patient's right internal jugular catheter is recommended. These findings were discussed with Dr. Stark Jock of the emergency department who has informed the patient's critical care attending of these findings.  No acute cardiopulmonary disease.   Electronically Signed   By: Margaree Mackintosh M.D.   On: 04/22/2013 20:16   Dg Chest Portable 1 View  04/22/2013   CLINICAL DATA:  Generalized body aches and emesis  EXAM: PORTABLE CHEST - 1 VIEW  COMPARISON:  Chest radiograph 04/22/2013 and 12/23/2010  FINDINGS: Right subclavian vascular stent noted. Numerous cardiac leads project over the chest. Cardiopericardial silhouette  appears enlarged, similar compared to today's earlier chest radiograph. Please note the heart size has increased since the prior study of 2012, for which pericardial effusion cannot be excluded. There is pulmonary vascular congestion. No definite pulmonary edema. Negative for consolidation.  IMPRESSION: No significant change compared to the radiograph performed earlier today. Assistant cardiomegaly and mild prominence of the pulmonary vascularity.   Electronically Signed   By: Curlene Dolphin M.D.   On: 04/22/2013 19:31   Dg Chest Port 1 View  04/22/2013   CLINICAL DATA:  fever, weakness, hypotension  EXAM: PORTABLE CHEST - 1 VIEW  COMPARISON:  DG CHEST 2 VIEW  dated 12/23/2010  FINDINGS: The film was taken in a lordotic projection. The lung volumes are mildly decreased. The cardiopericardial silhouette is markedly enlarged as compared to the previous study. There is no definite pleural effusion but basilar atelectasis is suspected especially on the left. The pulmonary vascularity is minimally prominent centrally.  IMPRESSION: There is bilateral pulmonary hyperinflation with new enlargement of the cardiac silhouette. The findings may reflect congestive heart failure. A pericardial effusion is not excluded.   Electronically Signed   By: David  Martinique   On: 04/22/2013 17:07   Ct Angio Chest Aorta W/cm &/or Wo/cm  04/22/2013   CLINICAL DATA:  Rule out dissection  EXAM: CT ANGIOGRAPHY CHEST WITH CONTRAST  TECHNIQUE: Multidetector CT imaging of the chest was performed using the standard protocol during bolus administration of intravenous contrast. Multiplanar CT image reconstructions and MIPs were obtained to evaluate the vascular anatomy.  CONTRAST:  132mL OMNIPAQUE IOHEXOL 350 MG/ML SOLN  COMPARISON:  DG CHEST 1V PORT dated 04/22/2013  FINDINGS: A 3 cm left thyroid nodule is appreciated. Further evaluation with nonemergent thyroid ultrasound recommended. A right subclavian vein stent is appreciated. The thoracic  inlet is otherwise unremarkable.  No mediastinal or hilar masses or adenopathy is appreciated. Atherosclerotic calcifications identified within the coronary arteries. The heart is enlarged.  There is no evidence of a thoracic aortic aneurysm nor dissection. There are no filling defects within the main, lobar, or segmental pulmonary arteries.  There areas of diffuse interlobular and subpleural septal thickening in the posterior upper lobes and lung bases. There also linear densities within the lung bases. More focal confluent areas of increased density are appreciated within the posterior dependent portions of the lung bases.  Partial visualization of the liver demonstrates subcapsular oval-shaped areas of low attenuation along the right lobe of the liver. Remaining visualized upper abdominal viscera are unremarkable.  No aggressive appearing osseous lesions are appreciated.  Review of the MIP images confirms the above findings.  IMPRESSION: 1. No CT evidence of a thoracic aortic aneurysm nor dissection. There is no evidence of pulmonary arterial embolic disease 2. Findings consistent with interstitial fibrosis. There regions of scarring versus atelectasis within the lung bases. Mild infiltrates within the posterior dependent portions of the lungs left greater than right cannot be excluded. 3. Indeterminate subcapsular low attenuating areas along the right lobe of the liver. The patient has a history of prior intra-abdominal surgery and these findings may represent postoperative changes. Clinical correlation recommended. 4. Left thyroid nodules further evaluation with nonemergent thyroid ultrasound recommended.   Electronically Signed   By: Margaree Mackintosh M.D.   On: 04/22/2013 21:16   Ecg 04/23/13: NSR, NML.  Echo: Study Conclusions  - Left ventricle: The cavity size was normal. Wall thickness was normal. Systolic function was normal. The estimated ejection fraction was in the range of 55% to 60%.  Left ventricular diastolic function parameters were normal. - Left atrium: The atrium was mildly dilated.    PHYSICAL EXAM General: Well developed, obese, in no acute distress. Head: Normocephalic, atraumatic, sclera non-icteric, no xanthomas, nares are without discharge. Neck: Negative for carotid bruits. JVD not elevated. Lungs: Clear bilaterally to auscultation without wheezes, rales, or rhonchi. Breathing is unlabored. Heart: RRR S1 S2 without murmurs, rubs, or gallops.  Abdomen: Soft, non-tender, non-distended with normoactive bowel sounds. No hepatomegaly. No rebound/guarding. No obvious abdominal masses. Msk:  Strength and tone appears normal for age. Extremities: No clubbing, cyanosis or edema.  Distal pedal pulses are 2+ and equal  bilaterally. AV fistula RUE. Neuro: Alert and oriented X 3. Moves all extremities spontaneously. Psych:  Responds to questions appropriately with a normal affect.  ASSESSMENT AND PLAN: 1. NSTEMI- peak troponin 11.84. ? Primary event versus related to severe sepsis and hypotension. Will proceed with cardiac cath today to assess coronary anatomy, risk, and treatment course. The procedure and risks were reviewed including but not limited to death, myocardial infarction, stroke, arrythmias, bleeding, transfusion, emergency surgery, dye allergy, or renal dysfunction. The patient voices understanding and is agreeable to proceed. 2. Septic shock with hypotension. WBC still elevated but otherwise improved.  3. Acute respiratory failure with pulmonary edema. Normal LV function by last Echo.  4. Hypotension. On midodrine. 5. ESRD on HD. For dialysis later today after cath. 6. C. Diff colitis. 7. Chronic anemia 8. Possible adrenal insufficiency. On steroids.    Principal Problem:   C. difficile colitis Active Problems:   CKD (chronic kidney disease) stage V requiring chronic dialysis   Septic shock(785.52)   Severe sepsis(995.92)    Signed, Binyomin Brann M [place]  MD,FACC 05/01/2013 7:28 AM

## 2013-05-02 DIAGNOSIS — I2510 Atherosclerotic heart disease of native coronary artery without angina pectoris: Secondary | ICD-10-CM

## 2013-05-02 DIAGNOSIS — N189 Chronic kidney disease, unspecified: Secondary | ICD-10-CM

## 2013-05-02 DIAGNOSIS — N039 Chronic nephritic syndrome with unspecified morphologic changes: Secondary | ICD-10-CM

## 2013-05-02 DIAGNOSIS — D631 Anemia in chronic kidney disease: Secondary | ICD-10-CM

## 2013-05-02 LAB — BASIC METABOLIC PANEL
BUN: 93 mg/dL — ABNORMAL HIGH (ref 6–23)
CALCIUM: 8.9 mg/dL (ref 8.4–10.5)
CO2: 18 mEq/L — ABNORMAL LOW (ref 19–32)
Chloride: 99 mEq/L (ref 96–112)
Creatinine, Ser: 7 mg/dL — ABNORMAL HIGH (ref 0.50–1.35)
GFR calc non Af Amer: 7 mL/min — ABNORMAL LOW (ref >90)
GFR, EST AFRICAN AMERICAN: 9 mL/min — AB (ref >90)
Glucose, Bld: 106 mg/dL — ABNORMAL HIGH (ref 70–99)
POTASSIUM: 5.7 meq/L — AB (ref 3.7–5.3)
SODIUM: 134 meq/L — AB (ref 137–147)

## 2013-05-02 LAB — CBC
HCT: 25.5 % — ABNORMAL LOW (ref 39.0–52.0)
HCT: 23.3 % — ABNORMAL LOW (ref 39.0–52.0)
HCT: 30.4 % — ABNORMAL LOW (ref 39.0–52.0)
Hemoglobin: 8.2 g/dL — ABNORMAL LOW (ref 13.0–17.0)
Hemoglobin: 7.6 g/dL — ABNORMAL LOW (ref 13.0–17.0)
Hemoglobin: 10.2 g/dL — ABNORMAL LOW (ref 13.0–17.0)
MCH: 30 pg (ref 26.0–34.0)
MCH: 30.3 pg (ref 26.0–34.0)
MCH: 30.9 pg (ref 26.0–34.0)
MCHC: 32.2 g/dL (ref 30.0–36.0)
MCHC: 32.6 g/dL (ref 30.0–36.0)
MCHC: 33.6 g/dL (ref 30.0–36.0)
MCV: 93.4 fL (ref 78.0–100.0)
MCV: 92.8 fL (ref 78.0–100.0)
MCV: 92.1 fL (ref 78.0–100.0)
PLATELETS: 236 10*3/uL (ref 150–400)
PLATELETS: 249 10*3/uL (ref 150–400)
Platelets: 257 10*3/uL (ref 150–400)
RBC: 2.73 MIL/uL — ABNORMAL LOW (ref 4.22–5.81)
RBC: 2.51 MIL/uL — ABNORMAL LOW (ref 4.22–5.81)
RBC: 3.3 MIL/uL — AB (ref 4.22–5.81)
RDW: 17.8 % — ABNORMAL HIGH (ref 11.5–15.5)
RDW: 17.8 % — AB (ref 11.5–15.5)
RDW: 17.4 % — ABNORMAL HIGH (ref 11.5–15.5)
WBC: 18.2 10*3/uL — ABNORMAL HIGH (ref 4.0–10.5)
WBC: 17.8 10*3/uL — ABNORMAL HIGH (ref 4.0–10.5)
WBC: 16.7 10*3/uL — AB (ref 4.0–10.5)

## 2013-05-02 LAB — HEPATITIS B SURFACE ANTIGEN: HEP B S AG: NEGATIVE

## 2013-05-02 LAB — GLUCOSE, CAPILLARY
Glucose-Capillary: 88 mg/dL (ref 70–99)
Glucose-Capillary: 107 mg/dL — ABNORMAL HIGH (ref 70–99)

## 2013-05-02 LAB — MAGNESIUM: Magnesium: 2.8 mg/dL — ABNORMAL HIGH (ref 1.5–2.5)

## 2013-05-02 LAB — APTT: aPTT: 35 seconds (ref 24–37)

## 2013-05-02 LAB — PHOSPHORUS: PHOSPHORUS: 8.8 mg/dL — AB (ref 2.3–4.6)

## 2013-05-02 LAB — PREPARE RBC (CROSSMATCH)

## 2013-05-02 MED ORDER — MIDODRINE HCL 5 MG PO TABS
5.0000 mg | ORAL_TABLET | Freq: Two times a day (BID) | ORAL | Status: DC
  Administered 2013-05-02 – 2013-05-03 (×2): 5 mg via ORAL
  Filled 2013-05-02 (×4): qty 1

## 2013-05-02 MED ORDER — ATORVASTATIN CALCIUM 20 MG PO TABS
20.0000 mg | ORAL_TABLET | Freq: Every day | ORAL | Status: DC
  Administered 2013-05-02: 20 mg via ORAL
  Filled 2013-05-02 (×2): qty 1

## 2013-05-02 MED ORDER — ASPIRIN 81 MG PO CHEW: 81.0000 mg | CHEWABLE_TABLET | Freq: Every day | ORAL | Status: DC

## 2013-05-02 MED FILL — Sodium Chloride IV Soln 0.9%: INTRAVENOUS | Qty: 50 | Status: AC

## 2013-05-02 NOTE — Progress Notes (Signed)
Subjective: still CP but receding. Had nosebleed "all night", aggrastat stopped  Filed Vitals:   05/02/13 1145 05/02/13 1200 05/02/13 1215 05/02/13 1230  BP: 100/52 97/53 100/58 102/58  Pulse: 75 88 83 73  Temp: 97.6 F (36.4 C)  96.7 F (35.9 C)   TempSrc: Oral  Oral   Resp: 19 17 17 16   Height:      Weight:      SpO2: 100% 100%  100%   Exam: Alert, no distress Chest clear bilat RRR no MRG Abd soft, NTND Trace bilat LE edema Neuro is nf, ox3 RUA AVF patent  Dialysis: Home hemodialysis 5d per week, average 3-4 hrs depending on fluid gain.  RUA AVF. 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  HD tomorrow, low dose heparin    Kelly Splinter MD  pager [phone]    cell [phone]  05/02/2013, 12:34 PM     Recent Labs Lab 04/30/13 0415 05/01/13 0500 05/01/13 1618 05/02/13 0415  NA 133* 137  --  134*  K 4.9 5.0  --  5.7*  CL 101 101  --  99  CO2 23 20  --  18*  GLUCOSE 124* 96  --  106*  BUN 46* 73*  --  93*  CREATININE 4.28* 6.41* 6.53* 7.00*  CALCIUM 9.3 9.3  --  8.9  PHOS 4.4 7.6*  --  8.8*    Recent Labs Lab 04/29/13 1600 04/30/13 0415 05/01/13 0500  AST  --  15  --   ALT  --  18  --   ALKPHOS  --  90  --   BILITOT  --  0.3  --   PROT  --  5.9*  --   ALBUMIN 2.5* 2.6* 2.7*    Recent Labs Lab 05/01/13 1618 05/02/13 0415 05/02/13 0750  WBC 21.1* 18.2* 17.8*  HGB 8.8* 8.2* 7.6*  HCT 27.2* 25.5* 23.3*  MCV 94.8 93.4 92.8  PLT 215 257 236   . aspirin  325 mg Oral Daily  . cinacalcet  60 mg Oral Q breakfast  . clopidogrel  75 mg Oral Q breakfast  . darbepoetin (ARANESP) injection -  DIALYSIS  100 mcg Intravenous Q Wed-HD  . heparin  5,000 Units Subcutaneous 3 times per day  . hydrocortisone sodium succinate  50 mg Intravenous 4 times per day  . midodrine  10 mg Oral TID WC  . multivitamin  1 tablet Oral QHS  . rOPINIRole  1 mg Oral QHS  . sevelamer carbonate  2,400 mg Oral TID WC  . sodium chloride  10-40 mL Intracatheter Q12H  . vancomycin  500 mg Oral 4 times per day   . sodium chloride 10 mL/hr (05/01/13 2215)  . heparin 10,000 units/ 20 mL infusion syringe Stopped (04/29/13 0816)   acetaminophen, fentaNYL, heparin, heparin, morphine injection, ondansetron (ZOFRAN) IV, promethazine, sodium chloride, sodium chloride

## 2013-05-02 NOTE — Procedures (Signed)
I was present at this dialysis session, have reviewed the session itself and made  appropriate changes  Rob Abella Shugart MD (pgr) [phone]    (c) [phone] 05/02/2013, 12:33 PM   

## 2013-05-02 NOTE — Progress Notes (Signed)
Pt admitted to the unit. Pt is alert and oriented. Pt oriented to room, staff, and call bell. Educated on fall safety plan. Bed in lowest position. Full assessment to Epic. Call bell with in reach. Educated to call for assists. Will continue to monitor. Zineb Glade, RN 
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
## 2013-05-02 NOTE — Progress Notes (Signed)
PULMONARY / CRITICAL CARE MEDICINE  Name: John R Odonell Sr. MRN: [id number] DOB: 16-Nov-1950    ADMISSION DATE:  04/22/2013 CONSULTATION DATE:  04/22/2013  REFERRING MD :  EDP PRIMARY SERVICE: PCCM  CHIEF COMPLAINT:  Chest pain  BRIEF PATIENT DESCRIPTION:  63 yo ESRD on HD who presented on 4/11 with chest pain and hypotension.  SIGNIFICANT EVENTS / STUDIES:  4/11  Chest CTA >>> NO dissection, NO embolus, IPF, thyroid nodules 4/11  TTE >>> EF 40% 4/12  TTE >>> LVEF 47-42%; normal diastolic function; NO pericardial effusion 4/12  CT abdomen >>> Bibasilar airspace disease, possible colitis without perforation, possible duodenitis / pancreatitis 4/13  CVVH started 4/15  Off vasopressors 4/16  CRRT to continue through day with plans to discontinue tomorrow and attempt IHD 4/19.  4/18  continued hypoglcyemia 4/19  Transfer to telemetry 4/20: failed cath result to icu for observation 4/21- nose bleed, on agrastat  LINES / TUBES: L Radial A-line 4/12 >> 4/17 R IJ TLC 4/11 >>  R fem HD cath 4/13 >>   CULTURES: 4/11 MRSA PCR >>> neg 4/11 Blood >> NEG 4/13 C.diff >>> POSITIVE  ANTIBIOTICS: Vancomycin IV 4/11>>>4/14 Ceftazidime 4/12 >>> 4/14 Flagyl IV 4/13 >> 4/17 Vancomycin PO 4/13 >>   INTERVAL HISTORY:  Nose bleed.  VITAL SIGNS: Temp:  [97.7 F (36.5 C)-98.5 F (36.9 C)] 98.5 F (36.9 C) (04/21 0700) Pulse Rate:  [57-108] 80 (04/21 0700) Resp:  [11-22] 11 (04/21 0700) BP: (98-132)/(42-66) 132/62 mmHg (04/21 0700) SpO2:  [89 %-100 %] 100 % (04/21 0700) Weight:  [114.8 kg (253 lb 1.4 oz)] 114.8 kg (253 lb 1.4 oz) (04/21 0500)  HEMODYNAMICS:    INTAKE / OUTPUT: Intake/Output     04/20 0701 - 04/21 0700 04/21 0701 - 04/22 0700   P.O. 100    I.V. (mL/kg) 221.1 (1.9)    Total Intake(mL/kg) 321.1 (2.8)    Urine (mL/kg/hr) 775 (0.3)    Stool     Total Output 775     Net -453.9           PHYSICAL EXAMINATION: General: in bed, no distress Neuro: alert, moves all  ext to command. HEENT: Lt IJ CVL site clean, line in place. Cardiovascular: RRR, Nl S1/S2, -M/R/G. Lungs: coarse Abdomen: soft, non tender, ND and +BS. Ext: no edema  LABS: CBC Recent Labs     05/01/13  0500  05/01/13  1618  05/02/13  0415  WBC  25.2*  21.1*  18.2*  HGB  9.0*  8.8*  8.2*  HCT  27.6*  27.2*  25.5*  PLT  195  215  257    Coag's Recent Labs     04/30/13  0415  05/01/13  0500  05/02/13  0415  APTT  32  29  35    BMET Recent Labs     04/30/13  0415  05/01/13  0500  05/01/13  1618  05/02/13  0415  NA  133*  137   --   134*  K  4.9  5.0   --   5.7*  CL  101  101   --   99  CO2  23  20   --   18*  BUN  46*  73*   --   93*  CREATININE  4.28*  6.41*  6.53*  7.00*  GLUCOSE  124*  96   --   106*    Electrolytes Recent Labs     04/30/13  8850  05/01/13  0500  05/02/13  0415  CALCIUM  9.3  9.3  8.9  MG  2.6*   --   2.8*  PHOS  4.4  7.6*  8.8*   Liver Enzymes Recent Labs     04/29/13  1600  04/30/13  0415  05/01/13  0500  AST   --   15   --   ALT   --   18   --   ALKPHOS   --   90   --   BILITOT   --   0.3   --   ALBUMIN  2.5*  2.6*  2.7*   Glucose Recent Labs     04/30/13  1141  04/30/13  1738  04/30/13  2349  05/01/13  0607  05/01/13  2335  05/02/13  0535  GLUCAP  106*  102*  110*  91  80  88    Imaging No results found.  ASSESSMENT / PLAN:  Septic shock 2nd to C diff, superimposed on Chronic hypotension. NSTEMI w/ severe CAD involving Circ S/p left heart cath and DES placement 4/20, but ongoing complete occlusion of OM1   P:  - tele - Continue ASA. - Needs echo prior to d/c per cards recommendations. - Continue midodrine and stress dose steroids (chronically hypotensive) - agrastat per cards, to be stopped this am -cbc follow up  ESRD, mild hyperkalemia P:  - HD per renal. - Replace electrolytes as indicated. - BMET in AM. -k noted, for HD today  Anemia of critical illness, and chronic disease, epistaxis  mild P:  - SCD's for DVT prevention - F/u CBC in pm and am  - Transfuse for Hb < 8 given CAD. - Heparin and anti-plat per cards recommendations -no need ent at this stage.  Septic shock 2nd to C diff colitis. P:  Day 9/14 of oral vancomycin, continue.  Presumed adrenal insufficiency. Hypoglycemia >> resolved 4/19. P:  - keep steroids, likely if BP wnl, reduce in am  - Continue to monitor CBG's to q12h  If ok with cards would move to [place]. Titus Mould, MD, [place] Pgr: [facility]

## 2013-05-02 NOTE — Progress Notes (Signed)
CARDIAC REHAB PHASE I   PRE:  Rate/Rhythm: 78 SR    BP: sitting 111/44    SaO2: 98 RA  MODE:  Ambulation: 700 ft   POST:  Rate/Rhythm: 98 SR    BP: sitting 116/61     SaO2: 96 RA  Pt easily got out of bed and walked steady at quick pace, 2 laps. Felt good. Some SOB noted, pt denies. Was walking quite quickly. To recliner. Tolerated well. Anxious to get packing out of nose. Will f/u. [place], ACSM 05/02/2013 3:09 PM

## 2013-05-03 ENCOUNTER — Inpatient Hospital Stay (HOSPITAL_COMMUNITY): Payer: Medicare Other

## 2013-05-03 DIAGNOSIS — N186 End stage renal disease: Secondary | ICD-10-CM

## 2013-05-03 LAB — TYPE AND SCREEN
ABO/RH(D): A NEG
ANTIBODY SCREEN: NEGATIVE
Donor AG Type: NEGATIVE
Donor AG Type: NEGATIVE
UNIT DIVISION: 0
Unit division: 0

## 2013-05-03 LAB — COMPREHENSIVE METABOLIC PANEL
ALT: 16 U/L (ref 0–53)
AST: 32 U/L (ref 0–37)
Albumin: 2.5 g/dL — ABNORMAL LOW (ref 3.5–5.2)
Alkaline Phosphatase: 58 U/L (ref 39–117)
BILIRUBIN TOTAL: 0.3 mg/dL (ref 0.3–1.2)
BUN: 60 mg/dL — ABNORMAL HIGH (ref 6–23)
CHLORIDE: 98 meq/L (ref 96–112)
CO2: 25 meq/L (ref 19–32)
Calcium: 8.6 mg/dL (ref 8.4–10.5)
Creatinine, Ser: 5.73 mg/dL — ABNORMAL HIGH (ref 0.50–1.35)
GFR calc Af Amer: 11 mL/min — ABNORMAL LOW (ref >90)
GFR calc non Af Amer: 9 mL/min — ABNORMAL LOW (ref >90)
Glucose, Bld: 98 mg/dL (ref 70–99)
Potassium: 4.7 mEq/L (ref 3.7–5.3)
Sodium: 138 mEq/L (ref 137–147)
Total Protein: 5.2 g/dL — ABNORMAL LOW (ref 6.0–8.3)

## 2013-05-03 LAB — ACTH: C206 ACTH: 15 pg/mL (ref 6–50)

## 2013-05-03 LAB — GLUCOSE, CAPILLARY
Glucose-Capillary: 74 mg/dL (ref 70–99)
Glucose-Capillary: 84 mg/dL (ref 70–99)
Glucose-Capillary: 95 mg/dL (ref 70–99)

## 2013-05-03 MED ORDER — MIDODRINE HCL 5 MG PO TABS: 5.0000 mg | ORAL_TABLET | Freq: Two times a day (BID) | ORAL | Status: DC

## 2013-05-03 MED ORDER — PREDNISONE 10 MG PO TABS: ORAL_TABLET | ORAL | Status: DC

## 2013-05-03 MED ORDER — VANCOMYCIN 50 MG/ML ORAL SOLUTION: 500.0000 mg | Freq: Four times a day (QID) | ORAL | Status: AC

## 2013-05-03 MED ORDER — ATORVASTATIN CALCIUM 20 MG PO TABS: 20.0000 mg | ORAL_TABLET | Freq: Every day | ORAL | Status: AC

## 2013-05-03 MED ORDER — CLOPIDOGREL BISULFATE 75 MG PO TABS: 75.0000 mg | ORAL_TABLET | Freq: Every day | ORAL | Status: DC

## 2013-05-03 NOTE — Progress Notes (Signed)
CARDIAC REHAB PHASE I   PRE:  Rate/Rhythm: 84 SR    BP: sitting 90/35    SaO2:   MODE:  Ambulation: 500 ft   POST:  Rate/Rhythm: 101 ST    BP: sitting 107/51     SaO2:   Tolerated well, independent, no c/o. Discussed MI, stent, ntg, ex and CRPII. Interested in [facility] and will send referral to [place]. Pts stent card is missing. Can walk independently. [place], ACSM 05/03/2013 10:20 AM

## 2013-05-03 NOTE — Discharge Summary (Signed)
Physician Discharge Summary  Patient ID: John R Suleiman Sr. MRN: [id number] DOB/AGE: 09/16/1950 63 y.o.  Admit date: 04/22/2013 Discharge date: 05/03/2013   Discharge Diagnoses:  Principal Problem:   C. difficile colitis Active Problems:   CKD (chronic kidney disease) stage V requiring chronic dialysis   Septic shock(785.52)   Severe sepsis(995.92)   Cardiogenic shock         D/c plan / TO DO for outpt   Shock - septic r/t CDiff colitis +/- cardiogenic, superimposed on chronic hypotension  NSTEMI - s/p cath and DES placement (but ongoing occlusion OM1 with jailing)  D/c plan --  Close outpt cards f/u  Statin, asa, plavix per cards  Change stress steroids to prednisone with taper and f/u as outpt - midodrine stopped per renal  outpt cardiac rehab   ESRD -  Presumed adrenal insufficiency  D/c plan --  Back to daily home HD as prior  F/u chem  D/c stress steroids, prednisone 85m daily with taper to 521mthen defer to renal as outpt  Unfortunately, NSTEMI/new DES/plavix will prevent kidney transplant this year but should not exclude him long term.    Anemia - s/p transfusion x 1  Mild epistaxis  D/c plan --  F/u cbc    CDiff colitis  D/c plan --  Cont po vanc to complete 14 days     Brief Summary: John R JoFischelr. is a [age].o. y/o male with a PMH of ESRD on home HD who presented 4/11 with chest pain, shock.  Troponins were elevated and stat bedside 2D echo showed no pericardial effusion/tamponade but did reveal EF35% with diffuse hypokinesis.  Shock thought cardiogenic +/- sepsis with CDiff colitis.  CT abd/pelvis results as below.  Troponins continued to trend upward, heparin gtt started but held off on cath r/t presumed concomitant septic shock and pressor dependence.  Ultimately underwent cardiac cath 4/20 with  Successful DES placement x1 but unfortunate jailing of orifinally stented OM1 causing complete occlusion of OM1.  He also developed acute hypoxic respiratory  failure in the setting of volume overload/pulmonary edema, resolved with volume removal.  Was treated empirically for HCAP.  Pt cont to have persistent hypotension, likely chronic to some degree.  He was weaned off of pressors and started on midodrine and stress steroids for presumed relative adrenal insufficiency. Other course complications included epistaxis on aggrastat, now resolved.  He also has hx of ESRD (following accident with kidney injury) on daily home HD and was followed closely throughout admission by nephrology.  Pt now much improved, ambulating independently, BP improved.  Denies chest pain, SOB.  Anxious to go home.    SIGNIFICANT EVENTS / STUDIES:  4/11 Chest CTA >>> NO dissection, NO embolus, IPF, thyroid nodules  4/11 TTE >>> EF 40%  4/12 TTE >>> LVEF 5570-48%normal diastolic function; NO pericardial effusion  4/12 CT abdomen >>> Bibasilar airspace disease, possible colitis without perforation, possible duodenitis / pancreatitis  4/13 CVVH started  4/15 Off vasopressors  4/16 CRRT to continue through day with plans to discontinue tomorrow and attempt IHD 4/19.  4/18 continued hypoglcyemia  4/19 Transfer to telemetry  4/20: failed cath result to icu for observation  4/21- nose bleed, on agrastat   LINES / TUBES:  L Radial A-line 4/12 >> 4/17  R IJ TLC 4/11 >>  R fem HD cath 4/13 >>   CULTURES:  4/11 MRSA PCR >>> neg  4/11 Blood >> NEG  4/13 C.diff >>> POSITIVE  ANTIBIOTICS:  Vancomycin IV 4/11>>>4/14  Ceftazidime 4/12 >>> 4/14  Flagyl IV 4/13 >> 4/17  Vancomycin PO 4/13 >>                                                               Filed Vitals:   05/02/13 1255 05/02/13 1600 05/02/13 2044 05/03/13 0511  BP:   104/50 91/61  Pulse:  76 80 84  Temp: 97.2 F (36.2 C) 97.6 F (36.4 C) 99.2 F (37.3 C) 99.4 F (37.4 C)  TempSrc: Oral Oral Oral Oral  Resp:   16 17  Height:      Weight:   248 lb 9.6 oz (112.764 kg)   SpO2:  95% 98% 98%     Discharge  Labs  BMET  Recent Labs Lab 04/27/13 0410  04/28/13 0351  04/29/13 0430 04/29/13 0500 04/29/13 1600 04/30/13 0415 05/01/13 0500 05/01/13 1618 05/02/13 0415 05/03/13 0655  NA 137  < > 138  < >  --  139 133* 133* 137  --  134* 138  K 4.4  < > 4.1  < >  --  4.2 5.2 4.9 5.0  --  5.7* 4.7  CL 102  < > 103  < >  --  103 99 101 101  --  99 98  CO2 22  < > 23  < >  --  24 23 23 20   --  18* 25  GLUCOSE 89  < > 58*  < >  --  85 111* 124* 96  --  106* 98  BUN 53*  < > 44*  < >  --  36* 31* 46* 73*  --  93* 60*  CREATININE 3.60*  < > 3.05*  < >  --  2.94* 2.92* 4.28* 6.41* 6.53* 7.00* 5.73*  CALCIUM 9.0  < > 8.8  < >  --  8.5 8.9 9.3 9.3  --  8.9 8.6  MG 2.6*  --  2.6*  --  2.4  --   --  2.6*  --   --  2.8*  --   PHOS 3.5  < > 3.5  < >  --  3.8 3.2 4.4 7.6*  --  8.8*  --   < > = values in this interval not displayed.   CBC   Recent Labs Lab 05/02/13 0415 05/02/13 0750 05/02/13 1900  HGB 8.2* 7.6* 10.2*  HCT 25.5* 23.3* 30.4*  WBC 18.2* 17.8* 16.7*  PLT 257 236 249   Anti-Coagulation No results found for this basename: INR,  in the last 168 hours    Discharge Orders   Future Appointments Provider Department Dept Phone   05/19/2013 10:00 AM Brittainy Ladoris [facility] [phone]   Future Orders Complete By Expires   Amb Referral to Cardiac Rehabilitation  As directed    Call MD for:  difficulty breathing, headache or visual disturbances  As directed    Call MD for:  extreme fatigue  As directed    Call MD for:  persistant dizziness or light-headedness  As directed    Diet - low sodium heart healthy  As directed    Increase activity slowly  As directed            Follow-up Information  Follow up with Crisoforo Oxford, PA-C. Schedule an appointment as soon as possible for a visit in 2 weeks.   Specialty:  Family Medicine   Contact information:   [place] [address] [phone]       Follow up with  Donetta Potts, MD On 05/10/2013. (as previously scheduled )    Specialty:  Nephrology   Contact information:   [place] [address] [phone]       Follow up with Lyda Jester, PA-C On 05/19/2013. (10:00am - Dr. Allison Quarry PA )    Specialty:  Cardiology   Contact information:   [address] [place] [address] [phone]          Medication List         aspirin EC 81 MG tablet  Take 81 mg by mouth daily.     atorvastatin 20 MG tablet  Commonly known as:  LIPITOR  Take 1 tablet (20 mg total) by mouth daily at 6 PM.     b complex-vitamin c-folic acid 0.8 MG Tabs tablet  Take 1 tablet by mouth at bedtime.     cinacalcet 60 MG tablet  Commonly known as:  SENSIPAR  Take 60 mg by mouth daily.     clopidogrel 75 MG tablet  Commonly known as:  PLAVIX  Take 1 tablet (75 mg total) by mouth daily with breakfast.     FISH OIL PO  Take 1 capsule by mouth daily as needed (Dietary supplementation).     furosemide 40 MG tablet  Commonly known as:  LASIX  Take 40 mg by mouth daily as needed for edema.     predniSONE 10 MG tablet  Commonly known as:  DELTASONE  2 tabs PO daily x 4 days, then 1 tab PO daily x 4 days then 1/2 tab po daily x 4 days then as directed by MD.     rOPINIRole 0.25 MG tablet  Commonly known as:  REQUIP  Take 1 mg by mouth at bedtime.     sevelamer carbonate 800 MG tablet  Commonly known as:  RENVELA  Take 2,400 mg by mouth 3 (three) times daily with meals.     vancomycin 50 mg/mL oral solution  Commonly known as:  VANCOCIN  Take 10 mLs (500 mg total) by mouth every 6 (six) hours.          Disposition: 01-Home or Self Care  Discharged Condition: John R Zayed Sr. has met maximum benefit of inpatient care and is medically stable and cleared for discharge.  Patient is pending follow up as above.      Time spent on disposition:  Greater than 35 minutes.    Signed: Marijean Heath, NP 05/03/2013  1:18 PM Pager: [phone] or [phone]  *Care during the described time interval was provided by me and/or other providers on the critical care team. I have reviewed this patient's available data, including medical history, events of note, physical examination and test results as part of my evaluation.     Supervised discharge. No bleeding on day of discharge  Dr. Brand Males, M.D., [facility].C.P Pulmonary and Critical Care Medicine Staff Physician [facility] Pager: [phone], If no answer or between  15:00h - 7:00h: call [phone]  05/11/2013 4:29 PM

## 2013-05-03 NOTE — Progress Notes (Addendum)
[facility] Progress Note  Subjective:   No emerging c/o's. Anxious for d/c today  Objective Filed Vitals:   05/02/13 1255 05/02/13 1600 05/02/13 2044 05/03/13 0511  BP:   104/50 91/61  Pulse:  76 80 84  Temp: 97.2 F (36.2 C) 97.6 F (36.4 C) 99.2 F (37.3 C) 99.4 F (37.4 C)  TempSrc: Oral Oral Oral Oral  Resp:   16 17  Height:      Weight:   112.764 kg (248 lb 9.6 oz)   SpO2:  95% 98% 98%   Physical Exam General: Alert, cooperative, NAD Heart: RRR Lungs: Diminished at bases. No wheezes or rales Abdomen: soft, NT, + BS Extremities: Trace LE edema Dialysis Access: R AVF + bruit  Dialysis: Home hemodialysis  5d per week, average 3-4 hrs depending on fluid gain. RUA AVF. EDW 112 kg  Assessment/Plan: 1. NSTEMI - s/p cath on 4/20 with PCI x 1, one other vessel lost to MI  2. Cdif colitis - po vanc, better 3. ESRD - TTS here, Resume home HD at discharge  4. Anemia  - Hgb 10.2 s/p transfusion. Aranesp 100 here. Will follow labs op for further ESA or Fe needs 5. BP/Volume - SBPs 90s - weaning midodrine. Net UF 1.4L yesterday. Mild volume excess on exam. Counseled fluid restr.  Should resolve with his home hemo schedule. 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 Epistaxis - Resolved 8. Dispo - Likely home today per primary   Santiago Glad E. Cletus Gash, PA-C [facility] Pager [phone] 05/03/2013,11:41 AM  LOS: 11 days   Pt seen, examined and agree w A/P as above. He was not on midodrine prior to admission, I have stopped it for now and we will reassess in OP setting if it needs to be restarted.  Kelly Splinter MD pager [phone]    cell [phone] 05/03/2013, 12:37 PM    Additional Objective Labs: Basic Metabolic Panel:  Recent Labs Lab 04/30/13 0415 05/01/13 0500 05/01/13 1618 05/02/13 0415 05/03/13 0655  NA 133* 137  --  134* 138  K 4.9 5.0  --  5.7* 4.7  CL 101 101  --  99 98  CO2 23 20  --  18* 25  GLUCOSE 124* 96  --  106* 98  BUN 46* 73*   --  93* 60*  CREATININE 4.28* 6.41* 6.53* 7.00* 5.73*  CALCIUM 9.3 9.3  --  8.9 8.6  PHOS 4.4 7.6*  --  8.8*  --    Liver Function Tests:  Recent Labs Lab 04/30/13 0415 05/01/13 0500 05/03/13 0655  AST 15  --  32  ALT 18  --  16  ALKPHOS 90  --  58  BILITOT 0.3  --  0.3  PROT 5.9*  --  5.2*  ALBUMIN 2.6* 2.7* 2.5*   CBC:  Recent Labs Lab 05/01/13 0500 05/01/13 1618 05/02/13 0415 05/02/13 0750 05/02/13 1900  WBC 25.2* 21.1* 18.2* 17.8* 16.7*  HGB 9.0* 8.8* 8.2* 7.6* 10.2*  HCT 27.6* 27.2* 25.5* 23.3* 30.4*  MCV 95.2 94.8 93.4 92.8 92.1  PLT 195 215 257 236 249   Blood Culture    Component Value Date/Time   SDES BLOOD LEFT HAND 04/22/2013 1532   SPECREQUEST BOTTLES DRAWN AEROBIC ONLY 10CC 04/22/2013 1532   CULT  Value: NO GROWTH 5 DAYS Performed at [facility] 04/22/2013 1532   REPTSTATUS 04/28/2013 FINAL 04/22/2013 1532    CBG:  Recent Labs Lab 05/01/13 2335 05/02/13 0535 05/02/13 1756 05/03/13  0029 05/03/13 0613  GLUCAP 80 88 107* 95 84   Studies/Results: Dg Chest Port 1 View  05/03/2013   CLINICAL DATA:  Shortness of breath, atelectasis  EXAM: PORTABLE CHEST - 1 VIEW  COMPARISON:  Portable exam 0706 hr compared to 04/28/2013  FINDINGS: Left jugular central venous catheter with tip projecting over SVC.  Vascular stent identified at right subclavian vein.  Enlargement of cardiac silhouette with slight pulmonary vascular congestion.  Mediastinal contours normal.  Bibasilar atelectasis.  Improved pulmonary edema.  Probable tiny bilateral pleural effusions.  No segmental consolidation or pneumothorax.  IMPRESSION: Enlargement of cardiac silhouette with pulmonary vascular congestion.  Improved pulmonary edema with suspect tiny bilateral pleural effusions.   Electronically Signed   By: Lavonia Dana M.D.   On: 05/03/2013 08:18   Medications: . sodium chloride 10 mL/hr (05/01/13 2215)   . aspirin  81 mg Oral Daily  . atorvastatin  20 mg Oral q1800  .  cinacalcet  60 mg Oral Q breakfast  . clopidogrel  75 mg Oral Q breakfast  . darbepoetin (ARANESP) injection - DIALYSIS  100 mcg Intravenous Q Wed-HD  . heparin  5,000 Units Subcutaneous 3 times per day  . hydrocortisone sodium succinate  50 mg Intravenous 4 times per day  . midodrine  5 mg Oral BID WC  . multivitamin  1 tablet Oral QHS  . rOPINIRole  1 mg Oral QHS  . sevelamer carbonate  2,400 mg Oral TID WC  . vancomycin  500 mg Oral 4 times per day

## 2013-05-03 NOTE — Progress Notes (Signed)
Patient Name: John R Cardell Sr. Date of Encounter: 05/03/2013  Principal Problem:   C. difficile colitis Active Problems:   CKD (chronic kidney disease) stage V requiring chronic dialysis   Septic shock(785.52)   Severe sepsis(995.92)   Cardiogenic shock   Length of Stay: 11  SUBJECTIVE  Feels well. Walked hallway without difficulty. No dyspnea, angina or dizziness.  CURRENT MEDS . aspirin  81 mg Oral Daily  . atorvastatin  20 mg Oral q1800  . cinacalcet  60 mg Oral Q breakfast  . clopidogrel  75 mg Oral Q breakfast  . darbepoetin (ARANESP) injection - DIALYSIS  100 mcg Intravenous Q Wed-HD  . heparin  5,000 Units Subcutaneous 3 times per day  . hydrocortisone sodium succinate  50 mg Intravenous 4 times per day  . midodrine  5 mg Oral BID WC  . multivitamin  1 tablet Oral QHS  . rOPINIRole  1 mg Oral QHS  . sevelamer carbonate  2,400 mg Oral TID WC  . vancomycin  500 mg Oral 4 times per day    OBJECTIVE   Intake/Output Summary (Last 24 hours) at 05/03/13 1043 Last data filed at 05/02/13 2109  Gross per 24 hour  Intake    940 ml  Output   1400 ml  Net   -460 ml   Filed Weights   05/02/13 0831 05/02/13 1240 05/02/13 2044  Weight: 256 lb 2.8 oz (116.2 kg) 253 lb 12 oz (115.1 kg) 248 lb 9.6 oz (112.764 kg)    PHYSICAL EXAM Filed Vitals:   05/02/13 1255 05/02/13 1600 05/02/13 2044 05/03/13 0511  BP:   104/50 91/61  Pulse:  76 80 84  Temp: 97.2 F (36.2 C) 97.6 F (36.4 C) 99.2 F (37.3 C) 99.4 F (37.4 C)  TempSrc: Oral Oral Oral Oral  Resp:   16 17  Height:      Weight:   248 lb 9.6 oz (112.764 kg)   SpO2:  95% 98% 98%   General: Alert, oriented x3, no distress Head: no evidence of trauma, PERRL, EOMI, no exophtalmos or lid lag, no myxedema, no xanthelasma; normal ears, nose and oropharynx Neck: normal jugular venous pulsations and no hepatojugular reflux; brisk carotid pulses without delay and no carotid bruits Chest: clear to auscultation, no signs of  consolidation by percussion or palpation, normal fremitus, symmetrical and full respiratory excursions Cardiovascular: normal position and quality of the apical impulse, regular rhythm, normal first and second heart sounds, no rubs or gallops, no murmur Abdomen: no tenderness or distention, no masses by palpation, no abnormal pulsatility or arterial bruits, normal bowel sounds, no hepatosplenomegaly Extremities: no clubbing, cyanosis or edema; 2+ radial, ulnar and brachial pulses bilaterally; 2+ right femoral, posterior tibial and dorsalis pedis pulses; 2+ left femoral, posterior tibial and dorsalis pedis pulses; no subclavian or femoral bruits Neurological: grossly nonfocal  LABS  CBC  Recent Labs  05/02/13 0750 05/02/13 1900  WBC 17.8* 16.7*  HGB 7.6* 10.2*  HCT 23.3* 30.4*  MCV 92.8 92.1  PLT 236 254   Basic Metabolic Panel  Recent Labs  05/01/13 0500  05/02/13 0415 05/03/13 0655  NA 137  --  134* 138  K 5.0  --  5.7* 4.7  CL 101  --  99 98  CO2 20  --  18* 25  GLUCOSE 96  --  106* 98  BUN 73*  --  93* 60*  CREATININE 6.41*  < > 7.00* 5.73*  CALCIUM 9.3  --  8.9  8.6  MG  --   --  2.8*  --   PHOS 7.6*  --  8.8*  --   < > = values in this interval not displayed. Liver Function Tests  Recent Labs  05/01/13 0500 05/03/13 0655  AST  --  32  ALT  --  16  ALKPHOS  --  58  BILITOT  --  0.3  PROT  --  5.2*  ALBUMIN 2.7* 2.5*   Radiology Studies Imaging results have been reviewed and Dg Chest Port 1 View  05/03/2013   CLINICAL DATA:  Shortness of breath, atelectasis  EXAM: PORTABLE CHEST - 1 VIEW  COMPARISON:  Portable exam 0706 hr compared to 04/28/2013  FINDINGS: Left jugular central venous catheter with tip projecting over SVC.  Vascular stent identified at right subclavian vein.  Enlargement of cardiac silhouette with slight pulmonary vascular congestion.  Mediastinal contours normal.  Bibasilar atelectasis.  Improved pulmonary edema.  Probable tiny bilateral pleural  effusions.  No segmental consolidation or pneumothorax.  IMPRESSION: Enlargement of cardiac silhouette with pulmonary vascular congestion.  Improved pulmonary edema with suspect tiny bilateral pleural effusions.   Electronically Signed   By: Lavonia Dana M.D.   On: 05/03/2013 08:18    TELE No arrhythmia  ECG NSR, remarkably normal  ASSESSMENT AND PLAN From a cardiology standpoint I think he is ready for DC. He will go to OP cardiac rehab. Discussed critical role of antiplatelet therapy in next 12 months. Unfortunately, this will prevent him from getting a kidney transplant this year. It does not exclude him long term.   Sanda Klein, MD, [facility] [phone] office [phone] pager 05/03/2013 10:43 AM

## 2013-05-04 DIAGNOSIS — N186 End stage renal disease: Secondary | ICD-10-CM | POA: Diagnosis not present

## 2013-05-12 DIAGNOSIS — R7309 Other abnormal glucose: Secondary | ICD-10-CM | POA: Diagnosis not present

## 2013-05-12 DIAGNOSIS — D509 Iron deficiency anemia, unspecified: Secondary | ICD-10-CM | POA: Diagnosis not present

## 2013-05-12 DIAGNOSIS — D631 Anemia in chronic kidney disease: Secondary | ICD-10-CM | POA: Diagnosis not present

## 2013-05-12 DIAGNOSIS — E878 Other disorders of electrolyte and fluid balance, not elsewhere classified: Secondary | ICD-10-CM | POA: Diagnosis not present

## 2013-05-12 DIAGNOSIS — N039 Chronic nephritic syndrome with unspecified morphologic changes: Secondary | ICD-10-CM | POA: Diagnosis not present

## 2013-05-12 DIAGNOSIS — I9589 Other hypotension: Secondary | ICD-10-CM | POA: Diagnosis not present

## 2013-05-12 DIAGNOSIS — N186 End stage renal disease: Secondary | ICD-10-CM | POA: Diagnosis not present

## 2013-05-14 DIAGNOSIS — N186 End stage renal disease: Secondary | ICD-10-CM | POA: Diagnosis not present

## 2013-05-14 DIAGNOSIS — D509 Iron deficiency anemia, unspecified: Secondary | ICD-10-CM | POA: Diagnosis not present

## 2013-05-14 DIAGNOSIS — E878 Other disorders of electrolyte and fluid balance, not elsewhere classified: Secondary | ICD-10-CM | POA: Diagnosis not present

## 2013-05-14 DIAGNOSIS — R7309 Other abnormal glucose: Secondary | ICD-10-CM | POA: Diagnosis not present

## 2013-05-14 DIAGNOSIS — D631 Anemia in chronic kidney disease: Secondary | ICD-10-CM | POA: Diagnosis not present

## 2013-05-15 DIAGNOSIS — R7309 Other abnormal glucose: Secondary | ICD-10-CM | POA: Diagnosis not present

## 2013-05-15 DIAGNOSIS — D631 Anemia in chronic kidney disease: Secondary | ICD-10-CM | POA: Diagnosis not present

## 2013-05-15 DIAGNOSIS — E878 Other disorders of electrolyte and fluid balance, not elsewhere classified: Secondary | ICD-10-CM | POA: Diagnosis not present

## 2013-05-15 DIAGNOSIS — N186 End stage renal disease: Secondary | ICD-10-CM | POA: Diagnosis not present

## 2013-05-15 DIAGNOSIS — D509 Iron deficiency anemia, unspecified: Secondary | ICD-10-CM | POA: Diagnosis not present

## 2013-05-16 ENCOUNTER — Encounter: Payer: Self-pay | Admitting: *Deleted

## 2013-05-17 ENCOUNTER — Encounter: Payer: Self-pay | Admitting: Cardiology

## 2013-05-17 DIAGNOSIS — D631 Anemia in chronic kidney disease: Secondary | ICD-10-CM | POA: Diagnosis not present

## 2013-05-17 DIAGNOSIS — E785 Hyperlipidemia, unspecified: Secondary | ICD-10-CM | POA: Diagnosis not present

## 2013-05-17 DIAGNOSIS — R7309 Other abnormal glucose: Secondary | ICD-10-CM | POA: Diagnosis not present

## 2013-05-17 DIAGNOSIS — N186 End stage renal disease: Secondary | ICD-10-CM | POA: Diagnosis not present

## 2013-05-17 DIAGNOSIS — D509 Iron deficiency anemia, unspecified: Secondary | ICD-10-CM | POA: Diagnosis not present

## 2013-05-17 DIAGNOSIS — E878 Other disorders of electrolyte and fluid balance, not elsewhere classified: Secondary | ICD-10-CM | POA: Diagnosis not present

## 2013-05-19 ENCOUNTER — Ambulatory Visit (INDEPENDENT_AMBULATORY_CARE_PROVIDER_SITE_OTHER): Payer: Medicare Other | Admitting: Cardiology

## 2013-05-19 ENCOUNTER — Other Ambulatory Visit: Payer: Self-pay | Admitting: Cardiology

## 2013-05-19 VITALS — BP 110/66 | HR 81 | Ht 72.0 in | Wt 249.0 lb

## 2013-05-19 DIAGNOSIS — Z7902 Long term (current) use of antithrombotics/antiplatelets: Secondary | ICD-10-CM

## 2013-05-19 DIAGNOSIS — D6959 Other secondary thrombocytopenia: Secondary | ICD-10-CM

## 2013-05-19 DIAGNOSIS — T45515A Adverse effect of anticoagulants, initial encounter: Secondary | ICD-10-CM | POA: Diagnosis not present

## 2013-05-19 DIAGNOSIS — I214 Non-ST elevation (NSTEMI) myocardial infarction: Secondary | ICD-10-CM | POA: Diagnosis not present

## 2013-05-19 DIAGNOSIS — I2510 Atherosclerotic heart disease of native coronary artery without angina pectoris: Secondary | ICD-10-CM

## 2013-05-19 DIAGNOSIS — IMO0002 Reserved for concepts with insufficient information to code with codable children: Secondary | ICD-10-CM

## 2013-05-19 DIAGNOSIS — R079 Chest pain, unspecified: Secondary | ICD-10-CM

## 2013-05-19 DIAGNOSIS — E878 Other disorders of electrolyte and fluid balance, not elsewhere classified: Secondary | ICD-10-CM | POA: Diagnosis not present

## 2013-05-19 DIAGNOSIS — N186 End stage renal disease: Secondary | ICD-10-CM | POA: Diagnosis not present

## 2013-05-19 DIAGNOSIS — Z992 Dependence on renal dialysis: Secondary | ICD-10-CM

## 2013-05-19 DIAGNOSIS — D509 Iron deficiency anemia, unspecified: Secondary | ICD-10-CM | POA: Diagnosis not present

## 2013-05-19 DIAGNOSIS — D631 Anemia in chronic kidney disease: Secondary | ICD-10-CM | POA: Diagnosis not present

## 2013-05-19 DIAGNOSIS — R7309 Other abnormal glucose: Secondary | ICD-10-CM | POA: Diagnosis not present

## 2013-05-19 MED ORDER — ISOSORBIDE MONONITRATE ER 30 MG PO TB24: 30.0000 mg | ORAL_TABLET | Freq: Every day | ORAL | Status: DC

## 2013-05-19 MED ORDER — NITROGLYCERIN 0.4 MG SL SUBL: 0.4000 mg | SUBLINGUAL_TABLET | SUBLINGUAL | Status: AC | PRN
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
## 2013-05-19 NOTE — Patient Instructions (Signed)
1. Take your imdur as instructed   2.Have your bloodwork done at [facility]  3.Take your Nitro as directed  4. See Dr. Martinique in 4 to 6 weeks

## 2013-05-22 ENCOUNTER — Telehealth: Payer: Self-pay | Admitting: *Deleted

## 2013-05-22 ENCOUNTER — Telehealth: Payer: Self-pay | Admitting: Cardiology

## 2013-05-22 DIAGNOSIS — E878 Other disorders of electrolyte and fluid balance, not elsewhere classified: Secondary | ICD-10-CM | POA: Diagnosis not present

## 2013-05-22 DIAGNOSIS — N186 End stage renal disease: Secondary | ICD-10-CM | POA: Diagnosis not present

## 2013-05-22 DIAGNOSIS — D509 Iron deficiency anemia, unspecified: Secondary | ICD-10-CM | POA: Diagnosis not present

## 2013-05-22 DIAGNOSIS — R7309 Other abnormal glucose: Secondary | ICD-10-CM | POA: Diagnosis not present

## 2013-05-22 DIAGNOSIS — Z79899 Other long term (current) drug therapy: Secondary | ICD-10-CM

## 2013-05-22 DIAGNOSIS — N039 Chronic nephritic syndrome with unspecified morphologic changes: Secondary | ICD-10-CM | POA: Diagnosis not present

## 2013-05-22 DIAGNOSIS — D631 Anemia in chronic kidney disease: Secondary | ICD-10-CM | POA: Diagnosis not present

## 2013-05-22 NOTE — Telephone Encounter (Signed)
Spoke with patient informing him per [employer] that he will need to come back for blood draw. Specimen they received on Friday was clotted and cannot be used. I telephoned the lab at [facility] and spoke with Luanna Salk and she informs me that i will need to put in another order and assures me the patient will only be charged once.

## 2013-05-22 NOTE — Telephone Encounter (Signed)
Lab called to inform me that the blood they received on Friday could not be used due to being "clotted". Patient will need to come back for a redraw.

## 2013-05-23 ENCOUNTER — Other Ambulatory Visit: Payer: Self-pay | Admitting: *Deleted

## 2013-05-23 ENCOUNTER — Telehealth: Payer: Self-pay | Admitting: *Deleted

## 2013-05-23 DIAGNOSIS — N186 End stage renal disease: Secondary | ICD-10-CM | POA: Diagnosis not present

## 2013-05-23 DIAGNOSIS — R7309 Other abnormal glucose: Secondary | ICD-10-CM | POA: Diagnosis not present

## 2013-05-23 DIAGNOSIS — E878 Other disorders of electrolyte and fluid balance, not elsewhere classified: Secondary | ICD-10-CM | POA: Diagnosis not present

## 2013-05-23 DIAGNOSIS — D509 Iron deficiency anemia, unspecified: Secondary | ICD-10-CM | POA: Diagnosis not present

## 2013-05-23 DIAGNOSIS — D631 Anemia in chronic kidney disease: Secondary | ICD-10-CM | POA: Diagnosis not present

## 2013-05-23 LAB — PLATELET INHIBITION P2Y12: PLATELET FUNCTION P2Y12: 226 [PRU] (ref 194–418)

## 2013-05-23 NOTE — Telephone Encounter (Signed)
Needing hospital order for P2Y12 to be drawn - was clotted when drawn on Friday 5/8. Order placed in hospital orders and faxed to [phone]

## 2013-05-25 DIAGNOSIS — E878 Other disorders of electrolyte and fluid balance, not elsewhere classified: Secondary | ICD-10-CM | POA: Diagnosis not present

## 2013-05-25 DIAGNOSIS — R7309 Other abnormal glucose: Secondary | ICD-10-CM | POA: Diagnosis not present

## 2013-05-25 DIAGNOSIS — D631 Anemia in chronic kidney disease: Secondary | ICD-10-CM | POA: Diagnosis not present

## 2013-05-25 DIAGNOSIS — D509 Iron deficiency anemia, unspecified: Secondary | ICD-10-CM | POA: Diagnosis not present

## 2013-05-25 DIAGNOSIS — N186 End stage renal disease: Secondary | ICD-10-CM | POA: Diagnosis not present

## 2013-05-26 DIAGNOSIS — R7309 Other abnormal glucose: Secondary | ICD-10-CM | POA: Diagnosis not present

## 2013-05-26 DIAGNOSIS — D631 Anemia in chronic kidney disease: Secondary | ICD-10-CM | POA: Diagnosis not present

## 2013-05-26 DIAGNOSIS — N186 End stage renal disease: Secondary | ICD-10-CM | POA: Diagnosis not present

## 2013-05-26 DIAGNOSIS — E878 Other disorders of electrolyte and fluid balance, not elsewhere classified: Secondary | ICD-10-CM | POA: Diagnosis not present

## 2013-05-26 DIAGNOSIS — D509 Iron deficiency anemia, unspecified: Secondary | ICD-10-CM | POA: Diagnosis not present

## 2013-05-27 DIAGNOSIS — D631 Anemia in chronic kidney disease: Secondary | ICD-10-CM | POA: Diagnosis not present

## 2013-05-27 DIAGNOSIS — E878 Other disorders of electrolyte and fluid balance, not elsewhere classified: Secondary | ICD-10-CM | POA: Diagnosis not present

## 2013-05-27 DIAGNOSIS — N186 End stage renal disease: Secondary | ICD-10-CM | POA: Diagnosis not present

## 2013-05-27 DIAGNOSIS — D509 Iron deficiency anemia, unspecified: Secondary | ICD-10-CM | POA: Diagnosis not present

## 2013-05-27 DIAGNOSIS — R7309 Other abnormal glucose: Secondary | ICD-10-CM | POA: Diagnosis not present

## 2013-05-29 DIAGNOSIS — E878 Other disorders of electrolyte and fluid balance, not elsewhere classified: Secondary | ICD-10-CM | POA: Diagnosis not present

## 2013-05-29 DIAGNOSIS — D509 Iron deficiency anemia, unspecified: Secondary | ICD-10-CM | POA: Diagnosis not present

## 2013-05-29 DIAGNOSIS — N186 End stage renal disease: Secondary | ICD-10-CM | POA: Diagnosis not present

## 2013-05-29 DIAGNOSIS — R7309 Other abnormal glucose: Secondary | ICD-10-CM | POA: Diagnosis not present

## 2013-05-29 DIAGNOSIS — D631 Anemia in chronic kidney disease: Secondary | ICD-10-CM | POA: Diagnosis not present

## 2013-05-30 ENCOUNTER — Encounter: Payer: Self-pay | Admitting: Cardiology

## 2013-05-30 DIAGNOSIS — I2510 Atherosclerotic heart disease of native coronary artery without angina pectoris: Secondary | ICD-10-CM | POA: Insufficient documentation

## 2013-05-30 NOTE — Assessment & Plan Note (Signed)
Status post non-STEMI. Status post PCI plus drug eluting stent placement in the mid circumflex into the AV groove OM2 circumflex. The originally stented OM1 was occluded and not amenable to PCI. Continue dual antiplatelet therapy with aspirin plus Plavix. Since he has had recurrent chest pain with moderate physical activity, will check a P2Y12 to assess platelet inhibition. We'll also add low-dose Imdur. We'll start at 15 mg daily. He has been instructed to monitor his blood pressure, especially on hemodialysis days. He states that he completes hemodialysis at home in the evenings. It was recommended that he take his Imdur in the morning. If he has issues with hypotension, he has been instructed to notify the office and to discontinue his Imdur.

## 2013-05-30 NOTE — Assessment & Plan Note (Signed)
He performs this at home 4 days a week.

## 2013-06-01 DIAGNOSIS — N186 End stage renal disease: Secondary | ICD-10-CM | POA: Diagnosis not present

## 2013-06-01 DIAGNOSIS — D631 Anemia in chronic kidney disease: Secondary | ICD-10-CM | POA: Diagnosis not present

## 2013-06-01 DIAGNOSIS — R7309 Other abnormal glucose: Secondary | ICD-10-CM | POA: Diagnosis not present

## 2013-06-01 DIAGNOSIS — D509 Iron deficiency anemia, unspecified: Secondary | ICD-10-CM | POA: Diagnosis not present

## 2013-06-01 DIAGNOSIS — E878 Other disorders of electrolyte and fluid balance, not elsewhere classified: Secondary | ICD-10-CM | POA: Diagnosis not present

## 2013-06-02 DIAGNOSIS — D631 Anemia in chronic kidney disease: Secondary | ICD-10-CM | POA: Diagnosis not present

## 2013-06-02 DIAGNOSIS — D509 Iron deficiency anemia, unspecified: Secondary | ICD-10-CM | POA: Diagnosis not present

## 2013-06-02 DIAGNOSIS — R7309 Other abnormal glucose: Secondary | ICD-10-CM | POA: Diagnosis not present

## 2013-06-02 DIAGNOSIS — N186 End stage renal disease: Secondary | ICD-10-CM | POA: Diagnosis not present

## 2013-06-02 DIAGNOSIS — E878 Other disorders of electrolyte and fluid balance, not elsewhere classified: Secondary | ICD-10-CM | POA: Diagnosis not present

## 2013-06-03 DIAGNOSIS — E878 Other disorders of electrolyte and fluid balance, not elsewhere classified: Secondary | ICD-10-CM | POA: Diagnosis not present

## 2013-06-03 DIAGNOSIS — N186 End stage renal disease: Secondary | ICD-10-CM | POA: Diagnosis not present

## 2013-06-03 DIAGNOSIS — R7309 Other abnormal glucose: Secondary | ICD-10-CM | POA: Diagnosis not present

## 2013-06-03 DIAGNOSIS — D509 Iron deficiency anemia, unspecified: Secondary | ICD-10-CM | POA: Diagnosis not present

## 2013-06-03 DIAGNOSIS — D631 Anemia in chronic kidney disease: Secondary | ICD-10-CM | POA: Diagnosis not present

## 2013-06-05 DIAGNOSIS — D631 Anemia in chronic kidney disease: Secondary | ICD-10-CM | POA: Diagnosis not present

## 2013-06-05 DIAGNOSIS — E878 Other disorders of electrolyte and fluid balance, not elsewhere classified: Secondary | ICD-10-CM | POA: Diagnosis not present

## 2013-06-05 DIAGNOSIS — N039 Chronic nephritic syndrome with unspecified morphologic changes: Secondary | ICD-10-CM | POA: Diagnosis not present

## 2013-06-05 DIAGNOSIS — D509 Iron deficiency anemia, unspecified: Secondary | ICD-10-CM | POA: Diagnosis not present

## 2013-06-05 DIAGNOSIS — R7309 Other abnormal glucose: Secondary | ICD-10-CM | POA: Diagnosis not present

## 2013-06-05 DIAGNOSIS — N186 End stage renal disease: Secondary | ICD-10-CM | POA: Diagnosis not present

## 2013-06-06 DIAGNOSIS — E878 Other disorders of electrolyte and fluid balance, not elsewhere classified: Secondary | ICD-10-CM | POA: Diagnosis not present

## 2013-06-06 DIAGNOSIS — D509 Iron deficiency anemia, unspecified: Secondary | ICD-10-CM | POA: Diagnosis not present

## 2013-06-06 DIAGNOSIS — N186 End stage renal disease: Secondary | ICD-10-CM | POA: Diagnosis not present

## 2013-06-06 DIAGNOSIS — D631 Anemia in chronic kidney disease: Secondary | ICD-10-CM | POA: Diagnosis not present

## 2013-06-06 DIAGNOSIS — R7309 Other abnormal glucose: Secondary | ICD-10-CM | POA: Diagnosis not present

## 2013-06-08 DIAGNOSIS — D631 Anemia in chronic kidney disease: Secondary | ICD-10-CM | POA: Diagnosis not present

## 2013-06-08 DIAGNOSIS — E878 Other disorders of electrolyte and fluid balance, not elsewhere classified: Secondary | ICD-10-CM | POA: Diagnosis not present

## 2013-06-08 DIAGNOSIS — N039 Chronic nephritic syndrome with unspecified morphologic changes: Secondary | ICD-10-CM | POA: Diagnosis not present

## 2013-06-08 DIAGNOSIS — N186 End stage renal disease: Secondary | ICD-10-CM | POA: Diagnosis not present

## 2013-06-08 DIAGNOSIS — D509 Iron deficiency anemia, unspecified: Secondary | ICD-10-CM | POA: Diagnosis not present

## 2013-06-08 DIAGNOSIS — R7309 Other abnormal glucose: Secondary | ICD-10-CM | POA: Diagnosis not present

## 2013-06-09 DIAGNOSIS — R7309 Other abnormal glucose: Secondary | ICD-10-CM | POA: Diagnosis not present

## 2013-06-09 DIAGNOSIS — D631 Anemia in chronic kidney disease: Secondary | ICD-10-CM | POA: Diagnosis not present

## 2013-06-09 DIAGNOSIS — N186 End stage renal disease: Secondary | ICD-10-CM | POA: Diagnosis not present

## 2013-06-09 DIAGNOSIS — D509 Iron deficiency anemia, unspecified: Secondary | ICD-10-CM | POA: Diagnosis not present

## 2013-06-09 DIAGNOSIS — E878 Other disorders of electrolyte and fluid balance, not elsewhere classified: Secondary | ICD-10-CM | POA: Diagnosis not present

## 2013-06-12 DIAGNOSIS — N186 End stage renal disease: Secondary | ICD-10-CM | POA: Diagnosis not present

## 2013-06-12 DIAGNOSIS — R7309 Other abnormal glucose: Secondary | ICD-10-CM | POA: Diagnosis not present

## 2013-06-12 DIAGNOSIS — D631 Anemia in chronic kidney disease: Secondary | ICD-10-CM | POA: Diagnosis not present

## 2013-06-12 DIAGNOSIS — E878 Other disorders of electrolyte and fluid balance, not elsewhere classified: Secondary | ICD-10-CM | POA: Diagnosis not present

## 2013-06-12 DIAGNOSIS — I9589 Other hypotension: Secondary | ICD-10-CM | POA: Diagnosis not present

## 2013-06-13 ENCOUNTER — Encounter: Payer: Self-pay | Admitting: Neurology

## 2013-06-13 ENCOUNTER — Ambulatory Visit (INDEPENDENT_AMBULATORY_CARE_PROVIDER_SITE_OTHER): Payer: Medicare Other | Admitting: Neurology

## 2013-06-13 VITALS — BP 134/76 | HR 76 | Resp 18 | Ht 72.0 in | Wt 253.0 lb

## 2013-06-13 DIAGNOSIS — N189 Chronic kidney disease, unspecified: Secondary | ICD-10-CM

## 2013-06-13 DIAGNOSIS — N186 End stage renal disease: Secondary | ICD-10-CM

## 2013-06-13 DIAGNOSIS — I9589 Other hypotension: Secondary | ICD-10-CM | POA: Diagnosis not present

## 2013-06-13 DIAGNOSIS — Z992 Dependence on renal dialysis: Secondary | ICD-10-CM

## 2013-06-13 DIAGNOSIS — N039 Chronic nephritic syndrome with unspecified morphologic changes: Secondary | ICD-10-CM

## 2013-06-13 DIAGNOSIS — D631 Anemia in chronic kidney disease: Secondary | ICD-10-CM | POA: Diagnosis not present

## 2013-06-13 DIAGNOSIS — R7309 Other abnormal glucose: Secondary | ICD-10-CM | POA: Diagnosis not present

## 2013-06-13 DIAGNOSIS — I25810 Atherosclerosis of coronary artery bypass graft(s) without angina pectoris: Secondary | ICD-10-CM | POA: Diagnosis not present

## 2013-06-13 DIAGNOSIS — R0902 Hypoxemia: Secondary | ICD-10-CM

## 2013-06-13 DIAGNOSIS — G4733 Obstructive sleep apnea (adult) (pediatric): Secondary | ICD-10-CM | POA: Insufficient documentation

## 2013-06-13 DIAGNOSIS — I2510 Atherosclerotic heart disease of native coronary artery without angina pectoris: Secondary | ICD-10-CM | POA: Diagnosis not present

## 2013-06-13 DIAGNOSIS — E878 Other disorders of electrolyte and fluid balance, not elsewhere classified: Secondary | ICD-10-CM | POA: Diagnosis not present

## 2013-06-13 MED ORDER — ROPINIROLE HCL 0.5 MG PO TABS: 0.5000 mg | ORAL_TABLET | ORAL | Status: DC
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
## 2013-06-13 NOTE — Patient Instructions (Signed)
Restless Legs Syndrome Restless legs syndrome is a movement disorder. It may also be called a sensori-motor disorder.  CAUSES  No one knows what specifically causes restless legs syndrome, but it tends to run in families. It is also more common in people with low iron, in pregnancy, in people who need dialysis, and those with nerve damage (neuropathy).Some medications may make restless legs syndrome worse.Those medications include drugs to treat high blood pressure, some heart conditions, nausea, colds, allergies, and depression. SYMPTOMS Symptoms include uncomfortable sensations in the legs. These leg sensations are worse during periods of inactivity or rest. They are also worse while sitting or lying down. Individuals that have the disorder describe sensations in the legs that feel like:  Pulling.  Drawing.  Crawling.  Worming.  Boring.  Tingling.  Pins and needles.  Prickling.  Pain. The sensations are usually accompanied by an overwhelming urge to move the legs. Sudden muscle jerks may also occur. Movement provides temporary relief from the discomfort. In rare cases, the arms may also be affected. Symptoms may interfere with going to sleep (sleep onset insomnia). Restless legs syndrome may also be related to periodic limb movement disorder (PLMD). PLMD is another more common motor disorder. It also causes interrupted sleep. The symptoms from PLMD usually occur most often when you are awake. TREATMENT  Treatment for restless legs syndrome is symptomatic. This means that the symptoms are treated.   Massage and cold compresses may provide temporary relief.  Walk, stretch, or take a cold or hot bath.  Get regular exercise and a good night's sleep.  Avoid caffeine, alcohol, nicotine, and medications that can make it worse.  Do activities that provide mental stimulation like discussions, needlework, and video games. These may be helpful if you are not able to walk or  stretch. Some medications are effective in relieving the symptoms. However, many of these medications have side effects. Ask your caregiver about medications that may help your symptoms. Correcting iron deficiency may improve symptoms for some patients. Document Released: 12/19/2001 Document Revised: 03/23/2011 Document Reviewed: 03/27/2010 [facility] Patient Information 2014 [place]. Sleep Apnea  Sleep apnea is a sleep disorder characterized by abnormal pauses in breathing while you sleep. When your breathing pauses, the level of oxygen in your blood decreases. This causes you to move out of deep sleep and into light sleep. As a result, your quality of sleep is poor, and the system that carries your blood throughout your body (cardiovascular system) experiences stress. If sleep apnea remains untreated, the following conditions can develop:  High blood pressure (hypertension).  Coronary artery disease.  Inability to achieve or maintain an erection (impotence).  Impairment of your thought process (cognitive dysfunction). There are three types of sleep apnea: 1. Obstructive sleep apnea Pauses in breathing during sleep because of a blocked airway. 2. Central sleep apnea Pauses in breathing during sleep because the area of the brain that controls your breathing does not send the correct signals to the muscles that control breathing. 3. Mixed sleep apnea A combination of both obstructive and central sleep apnea. RISK FACTORS The following risk factors can increase your risk of developing sleep apnea:  Being overweight.  Smoking.  Having narrow passages in your nose and throat.  Being of older age.  Being male.  Alcohol use.  Sedative and tranquilizer use.  Ethnicity. Among individuals younger than 35 years, African Americans are at increased risk of sleep apnea. SYMPTOMS   Difficulty staying asleep.  Daytime sleepiness and fatigue.  Loss of energy.  Irritability.  Loud,  heavy snoring.  Morning headaches.  Trouble concentrating.  Forgetfulness.  Decreased interest in sex. DIAGNOSIS  In order to diagnose sleep apnea, your caregiver will perform a physical examination. Your caregiver may suggest that you take a home sleep test. Your caregiver may also recommend that you spend the night in a sleep lab. In the sleep lab, several monitors record information about your heart, lungs, and brain while you sleep. Your leg and arm movements and blood oxygen level are also recorded. TREATMENT The following actions may help to resolve mild sleep apnea:  Sleeping on your side.   Using a decongestant if you have nasal congestion.   Avoiding the use of depressants, including alcohol, sedatives, and narcotics.   Losing weight and modifying your diet if you are overweight. There also are devices and treatments to help open your airway:  Oral appliances. These are custom-made mouthpieces that shift your lower jaw forward and slightly open your bite. This opens your airway.  Devices that create positive airway pressure. This positive pressure "splints" your airway open to help you breathe better during sleep. The following devices create positive airway pressure:  Continuous positive airway pressure (CPAP) device. The CPAP device creates a continuous level of air pressure with an air pump. The air is delivered to your airway through a mask while you sleep. This continuous pressure keeps your airway open.  Nasal expiratory positive airway pressure (EPAP) device. The EPAP device creates positive air pressure as you exhale. The device consists of single-use valves, which are inserted into each nostril and held in place by adhesive. The valves create very little resistance when you inhale but create much more resistance when you exhale. That increased resistance creates the positive airway pressure. This positive pressure while you exhale keeps your airway open, making it  easier to breath when you inhale again.  Bilevel positive airway pressure (BPAP) device. The BPAP device is used mainly in patients with central sleep apnea. This device is similar to the CPAP device because it also uses an air pump to deliver continuous air pressure through a mask. However, with the BPAP machine, the pressure is set at two different levels. The pressure when you exhale is lower than the pressure when you inhale.  Surgery. Typically, surgery is only done if you cannot comply with less invasive treatments or if the less invasive treatments do not improve your condition. Surgery involves removing excess tissue in your airway to create a wider passage way. Document Released: 12/19/2001 Document Revised: 04/25/2012 Document Reviewed: 05/07/2011 [facility] Patient Information 2014 [place].

## 2013-06-14 ENCOUNTER — Ambulatory Visit (INDEPENDENT_AMBULATORY_CARE_PROVIDER_SITE_OTHER): Payer: Medicare Other | Admitting: Cardiology

## 2013-06-14 ENCOUNTER — Encounter: Payer: Self-pay | Admitting: Cardiology

## 2013-06-14 VITALS — BP 125/68 | HR 90 | Ht 72.0 in | Wt 246.0 lb

## 2013-06-14 DIAGNOSIS — N186 End stage renal disease: Secondary | ICD-10-CM | POA: Diagnosis not present

## 2013-06-14 DIAGNOSIS — Z992 Dependence on renal dialysis: Principal | ICD-10-CM

## 2013-06-14 DIAGNOSIS — E878 Other disorders of electrolyte and fluid balance, not elsewhere classified: Secondary | ICD-10-CM | POA: Diagnosis not present

## 2013-06-14 DIAGNOSIS — I2510 Atherosclerotic heart disease of native coronary artery without angina pectoris: Secondary | ICD-10-CM | POA: Diagnosis not present

## 2013-06-14 DIAGNOSIS — I9589 Other hypotension: Secondary | ICD-10-CM | POA: Diagnosis not present

## 2013-06-14 DIAGNOSIS — E785 Hyperlipidemia, unspecified: Secondary | ICD-10-CM | POA: Diagnosis not present

## 2013-06-14 DIAGNOSIS — D631 Anemia in chronic kidney disease: Secondary | ICD-10-CM | POA: Diagnosis not present

## 2013-06-14 DIAGNOSIS — R7309 Other abnormal glucose: Secondary | ICD-10-CM | POA: Diagnosis not present
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
## 2013-06-14 NOTE — Patient Instructions (Signed)
Continue your current therapy  I will see you in 6 months.   

## 2013-06-15 DIAGNOSIS — N186 End stage renal disease: Secondary | ICD-10-CM | POA: Diagnosis not present

## 2013-06-15 DIAGNOSIS — E878 Other disorders of electrolyte and fluid balance, not elsewhere classified: Secondary | ICD-10-CM | POA: Diagnosis not present

## 2013-06-15 DIAGNOSIS — D631 Anemia in chronic kidney disease: Secondary | ICD-10-CM | POA: Diagnosis not present

## 2013-06-15 DIAGNOSIS — R7309 Other abnormal glucose: Secondary | ICD-10-CM | POA: Diagnosis not present

## 2013-06-15 DIAGNOSIS — I9589 Other hypotension: Secondary | ICD-10-CM | POA: Diagnosis not present

## 2013-06-16 DIAGNOSIS — E878 Other disorders of electrolyte and fluid balance, not elsewhere classified: Secondary | ICD-10-CM | POA: Diagnosis not present

## 2013-06-16 DIAGNOSIS — N039 Chronic nephritic syndrome with unspecified morphologic changes: Secondary | ICD-10-CM | POA: Diagnosis not present

## 2013-06-16 DIAGNOSIS — D631 Anemia in chronic kidney disease: Secondary | ICD-10-CM | POA: Diagnosis not present

## 2013-06-16 DIAGNOSIS — N186 End stage renal disease: Secondary | ICD-10-CM | POA: Diagnosis not present

## 2013-06-16 DIAGNOSIS — R7309 Other abnormal glucose: Secondary | ICD-10-CM | POA: Diagnosis not present

## 2013-06-16 DIAGNOSIS — I9589 Other hypotension: Secondary | ICD-10-CM | POA: Diagnosis not present

## 2013-06-19 DIAGNOSIS — E878 Other disorders of electrolyte and fluid balance, not elsewhere classified: Secondary | ICD-10-CM | POA: Diagnosis not present

## 2013-06-19 DIAGNOSIS — R7309 Other abnormal glucose: Secondary | ICD-10-CM | POA: Diagnosis not present

## 2013-06-19 DIAGNOSIS — D631 Anemia in chronic kidney disease: Secondary | ICD-10-CM | POA: Diagnosis not present

## 2013-06-19 DIAGNOSIS — N186 End stage renal disease: Secondary | ICD-10-CM | POA: Diagnosis not present

## 2013-06-19 DIAGNOSIS — I9589 Other hypotension: Secondary | ICD-10-CM | POA: Diagnosis not present

## 2013-06-22 DIAGNOSIS — E878 Other disorders of electrolyte and fluid balance, not elsewhere classified: Secondary | ICD-10-CM | POA: Diagnosis not present

## 2013-06-22 DIAGNOSIS — I9589 Other hypotension: Secondary | ICD-10-CM | POA: Diagnosis not present

## 2013-06-22 DIAGNOSIS — D631 Anemia in chronic kidney disease: Secondary | ICD-10-CM | POA: Diagnosis not present

## 2013-06-22 DIAGNOSIS — R7309 Other abnormal glucose: Secondary | ICD-10-CM | POA: Diagnosis not present

## 2013-06-22 DIAGNOSIS — N039 Chronic nephritic syndrome with unspecified morphologic changes: Secondary | ICD-10-CM | POA: Diagnosis not present

## 2013-06-22 DIAGNOSIS — N186 End stage renal disease: Secondary | ICD-10-CM | POA: Diagnosis not present

## 2013-06-23 DIAGNOSIS — R7309 Other abnormal glucose: Secondary | ICD-10-CM | POA: Diagnosis not present

## 2013-06-23 DIAGNOSIS — E878 Other disorders of electrolyte and fluid balance, not elsewhere classified: Secondary | ICD-10-CM | POA: Diagnosis not present

## 2013-06-23 DIAGNOSIS — N186 End stage renal disease: Secondary | ICD-10-CM | POA: Diagnosis not present

## 2013-06-23 DIAGNOSIS — I9589 Other hypotension: Secondary | ICD-10-CM | POA: Diagnosis not present

## 2013-06-23 DIAGNOSIS — D631 Anemia in chronic kidney disease: Secondary | ICD-10-CM | POA: Diagnosis not present

## 2013-06-24 ENCOUNTER — Ambulatory Visit (INDEPENDENT_AMBULATORY_CARE_PROVIDER_SITE_OTHER): Payer: Medicare Other | Admitting: Neurology

## 2013-06-24 DIAGNOSIS — G4733 Obstructive sleep apnea (adult) (pediatric): Secondary | ICD-10-CM

## 2013-06-24 DIAGNOSIS — N186 End stage renal disease: Secondary | ICD-10-CM

## 2013-06-24 DIAGNOSIS — R7309 Other abnormal glucose: Secondary | ICD-10-CM | POA: Diagnosis not present

## 2013-06-24 DIAGNOSIS — Z992 Dependence on renal dialysis: Secondary | ICD-10-CM

## 2013-06-24 DIAGNOSIS — D631 Anemia in chronic kidney disease: Secondary | ICD-10-CM | POA: Diagnosis not present

## 2013-06-24 DIAGNOSIS — R0902 Hypoxemia: Secondary | ICD-10-CM

## 2013-06-24 DIAGNOSIS — I9589 Other hypotension: Secondary | ICD-10-CM | POA: Diagnosis not present

## 2013-06-24 DIAGNOSIS — E878 Other disorders of electrolyte and fluid balance, not elsewhere classified: Secondary | ICD-10-CM | POA: Diagnosis not present

## 2013-06-24 DIAGNOSIS — I25810 Atherosclerosis of coronary artery bypass graft(s) without angina pectoris: Secondary | ICD-10-CM

## 2013-06-25 DIAGNOSIS — N186 End stage renal disease: Secondary | ICD-10-CM | POA: Diagnosis not present

## 2013-06-25 DIAGNOSIS — I9589 Other hypotension: Secondary | ICD-10-CM | POA: Diagnosis not present

## 2013-06-25 DIAGNOSIS — N039 Chronic nephritic syndrome with unspecified morphologic changes: Secondary | ICD-10-CM | POA: Diagnosis not present

## 2013-06-25 DIAGNOSIS — E878 Other disorders of electrolyte and fluid balance, not elsewhere classified: Secondary | ICD-10-CM | POA: Diagnosis not present

## 2013-06-25 DIAGNOSIS — R7309 Other abnormal glucose: Secondary | ICD-10-CM | POA: Diagnosis not present

## 2013-06-25 DIAGNOSIS — D631 Anemia in chronic kidney disease: Secondary | ICD-10-CM | POA: Diagnosis not present

## 2013-06-26 DIAGNOSIS — R7309 Other abnormal glucose: Secondary | ICD-10-CM | POA: Diagnosis not present

## 2013-06-26 DIAGNOSIS — N039 Chronic nephritic syndrome with unspecified morphologic changes: Secondary | ICD-10-CM | POA: Diagnosis not present

## 2013-06-26 DIAGNOSIS — I9589 Other hypotension: Secondary | ICD-10-CM | POA: Diagnosis not present

## 2013-06-26 DIAGNOSIS — D631 Anemia in chronic kidney disease: Secondary | ICD-10-CM | POA: Diagnosis not present

## 2013-06-26 DIAGNOSIS — N186 End stage renal disease: Secondary | ICD-10-CM | POA: Diagnosis not present

## 2013-06-26 DIAGNOSIS — E878 Other disorders of electrolyte and fluid balance, not elsewhere classified: Secondary | ICD-10-CM | POA: Diagnosis not present

## 2013-06-28 DIAGNOSIS — E878 Other disorders of electrolyte and fluid balance, not elsewhere classified: Secondary | ICD-10-CM | POA: Diagnosis not present

## 2013-06-28 DIAGNOSIS — D631 Anemia in chronic kidney disease: Secondary | ICD-10-CM | POA: Diagnosis not present

## 2013-06-28 DIAGNOSIS — N186 End stage renal disease: Secondary | ICD-10-CM | POA: Diagnosis not present

## 2013-06-28 DIAGNOSIS — R7309 Other abnormal glucose: Secondary | ICD-10-CM | POA: Diagnosis not present

## 2013-06-28 DIAGNOSIS — I9589 Other hypotension: Secondary | ICD-10-CM | POA: Diagnosis not present

## 2013-06-29 DIAGNOSIS — R7309 Other abnormal glucose: Secondary | ICD-10-CM | POA: Diagnosis not present

## 2013-06-29 DIAGNOSIS — D631 Anemia in chronic kidney disease: Secondary | ICD-10-CM | POA: Diagnosis not present

## 2013-06-29 DIAGNOSIS — N186 End stage renal disease: Secondary | ICD-10-CM | POA: Diagnosis not present

## 2013-06-29 DIAGNOSIS — E878 Other disorders of electrolyte and fluid balance, not elsewhere classified: Secondary | ICD-10-CM | POA: Diagnosis not present

## 2013-06-29 DIAGNOSIS — I9589 Other hypotension: Secondary | ICD-10-CM | POA: Diagnosis not present

## 2013-06-30 ENCOUNTER — Ambulatory Visit: Payer: Medicare Other | Admitting: Cardiology

## 2013-06-30 DIAGNOSIS — R7309 Other abnormal glucose: Secondary | ICD-10-CM | POA: Diagnosis not present

## 2013-06-30 DIAGNOSIS — I9589 Other hypotension: Secondary | ICD-10-CM | POA: Diagnosis not present

## 2013-06-30 DIAGNOSIS — E878 Other disorders of electrolyte and fluid balance, not elsewhere classified: Secondary | ICD-10-CM | POA: Diagnosis not present

## 2013-06-30 DIAGNOSIS — N186 End stage renal disease: Secondary | ICD-10-CM | POA: Diagnosis not present

## 2013-06-30 DIAGNOSIS — N039 Chronic nephritic syndrome with unspecified morphologic changes: Secondary | ICD-10-CM | POA: Diagnosis not present

## 2013-06-30 DIAGNOSIS — D631 Anemia in chronic kidney disease: Secondary | ICD-10-CM | POA: Diagnosis not present

## 2013-07-01 DIAGNOSIS — I9589 Other hypotension: Secondary | ICD-10-CM | POA: Diagnosis not present

## 2013-07-01 DIAGNOSIS — N186 End stage renal disease: Secondary | ICD-10-CM | POA: Diagnosis not present

## 2013-07-01 DIAGNOSIS — D631 Anemia in chronic kidney disease: Secondary | ICD-10-CM | POA: Diagnosis not present

## 2013-07-01 DIAGNOSIS — R7309 Other abnormal glucose: Secondary | ICD-10-CM | POA: Diagnosis not present

## 2013-07-01 DIAGNOSIS — E878 Other disorders of electrolyte and fluid balance, not elsewhere classified: Secondary | ICD-10-CM | POA: Diagnosis not present

## 2013-07-02 DIAGNOSIS — D631 Anemia in chronic kidney disease: Secondary | ICD-10-CM | POA: Diagnosis not present

## 2013-07-02 DIAGNOSIS — N186 End stage renal disease: Secondary | ICD-10-CM | POA: Diagnosis not present

## 2013-07-02 DIAGNOSIS — R7309 Other abnormal glucose: Secondary | ICD-10-CM | POA: Diagnosis not present

## 2013-07-02 DIAGNOSIS — I9589 Other hypotension: Secondary | ICD-10-CM | POA: Diagnosis not present

## 2013-07-02 DIAGNOSIS — N039 Chronic nephritic syndrome with unspecified morphologic changes: Secondary | ICD-10-CM | POA: Diagnosis not present

## 2013-07-02 DIAGNOSIS — E878 Other disorders of electrolyte and fluid balance, not elsewhere classified: Secondary | ICD-10-CM | POA: Diagnosis not present

## 2013-07-03 DIAGNOSIS — N186 End stage renal disease: Secondary | ICD-10-CM | POA: Diagnosis not present

## 2013-07-03 DIAGNOSIS — E878 Other disorders of electrolyte and fluid balance, not elsewhere classified: Secondary | ICD-10-CM | POA: Diagnosis not present

## 2013-07-03 DIAGNOSIS — R7309 Other abnormal glucose: Secondary | ICD-10-CM | POA: Diagnosis not present

## 2013-07-03 DIAGNOSIS — I9589 Other hypotension: Secondary | ICD-10-CM | POA: Diagnosis not present

## 2013-07-03 DIAGNOSIS — N039 Chronic nephritic syndrome with unspecified morphologic changes: Secondary | ICD-10-CM | POA: Diagnosis not present

## 2013-07-03 DIAGNOSIS — D631 Anemia in chronic kidney disease: Secondary | ICD-10-CM | POA: Diagnosis not present

## 2013-07-04 DIAGNOSIS — D631 Anemia in chronic kidney disease: Secondary | ICD-10-CM | POA: Diagnosis not present

## 2013-07-04 DIAGNOSIS — I9589 Other hypotension: Secondary | ICD-10-CM | POA: Diagnosis not present

## 2013-07-04 DIAGNOSIS — N186 End stage renal disease: Secondary | ICD-10-CM | POA: Diagnosis not present

## 2013-07-04 DIAGNOSIS — R7309 Other abnormal glucose: Secondary | ICD-10-CM | POA: Diagnosis not present

## 2013-07-04 DIAGNOSIS — E878 Other disorders of electrolyte and fluid balance, not elsewhere classified: Secondary | ICD-10-CM | POA: Diagnosis not present

## 2013-07-05 DIAGNOSIS — N039 Chronic nephritic syndrome with unspecified morphologic changes: Secondary | ICD-10-CM | POA: Diagnosis not present

## 2013-07-05 DIAGNOSIS — I9589 Other hypotension: Secondary | ICD-10-CM | POA: Diagnosis not present

## 2013-07-05 DIAGNOSIS — D631 Anemia in chronic kidney disease: Secondary | ICD-10-CM | POA: Diagnosis not present

## 2013-07-05 DIAGNOSIS — N186 End stage renal disease: Secondary | ICD-10-CM | POA: Diagnosis not present

## 2013-07-05 DIAGNOSIS — R7309 Other abnormal glucose: Secondary | ICD-10-CM | POA: Diagnosis not present

## 2013-07-05 DIAGNOSIS — E878 Other disorders of electrolyte and fluid balance, not elsewhere classified: Secondary | ICD-10-CM | POA: Diagnosis not present

## 2013-07-06 ENCOUNTER — Telehealth: Payer: Self-pay | Admitting: Neurology

## 2013-07-06 ENCOUNTER — Encounter: Payer: Self-pay | Admitting: *Deleted

## 2013-07-06 DIAGNOSIS — D631 Anemia in chronic kidney disease: Secondary | ICD-10-CM | POA: Diagnosis not present

## 2013-07-06 DIAGNOSIS — E878 Other disorders of electrolyte and fluid balance, not elsewhere classified: Secondary | ICD-10-CM | POA: Diagnosis not present

## 2013-07-06 DIAGNOSIS — R7309 Other abnormal glucose: Secondary | ICD-10-CM | POA: Diagnosis not present

## 2013-07-06 DIAGNOSIS — G4733 Obstructive sleep apnea (adult) (pediatric): Secondary | ICD-10-CM

## 2013-07-06 DIAGNOSIS — I9589 Other hypotension: Secondary | ICD-10-CM | POA: Diagnosis not present

## 2013-07-06 DIAGNOSIS — N186 End stage renal disease: Secondary | ICD-10-CM | POA: Diagnosis not present

## 2013-07-06 NOTE — Telephone Encounter (Signed)
I called and spoke with the patient's spouse about his sleep study results. I informed the patient's spouse that the study revealed severe obstructive sleep apnea. Dr. Brett Fairy recommend starting CPAP therapy at home. I offered the patient's to send the CPAP order to [facility], patient's spouse stated not to send the order to [facility] and wanted a different DME company. I will send the order to [facility] who will contact the patient. I will fax a copy of the report to Dr. Katrine Coho and Dr. Jimmy Footman office and mail a copy to the patient along with a follow up instruction letter.

## 2013-07-07 ENCOUNTER — Telehealth: Payer: Self-pay | Admitting: Neurology

## 2013-07-07 DIAGNOSIS — R7309 Other abnormal glucose: Secondary | ICD-10-CM | POA: Diagnosis not present

## 2013-07-07 DIAGNOSIS — D631 Anemia in chronic kidney disease: Secondary | ICD-10-CM | POA: Diagnosis not present

## 2013-07-07 DIAGNOSIS — I9589 Other hypotension: Secondary | ICD-10-CM | POA: Diagnosis not present

## 2013-07-07 DIAGNOSIS — N186 End stage renal disease: Secondary | ICD-10-CM | POA: Diagnosis not present

## 2013-07-07 DIAGNOSIS — E878 Other disorders of electrolyte and fluid balance, not elsewhere classified: Secondary | ICD-10-CM | POA: Diagnosis not present

## 2013-07-07 NOTE — Telephone Encounter (Signed)
Pt's information was given to [facility], Medical Records. Please advise

## 2013-07-07 NOTE — Telephone Encounter (Signed)
Patient stopped by the office today wanting a copy of his recent sleep study as he has an appointment this evening at the [place] in [facility]). He would like to pick this up to take with him or have it faxed over there. Please call to advise at [phone]

## 2013-07-09 DIAGNOSIS — D631 Anemia in chronic kidney disease: Secondary | ICD-10-CM | POA: Diagnosis not present

## 2013-07-09 DIAGNOSIS — E878 Other disorders of electrolyte and fluid balance, not elsewhere classified: Secondary | ICD-10-CM | POA: Diagnosis not present

## 2013-07-09 DIAGNOSIS — N186 End stage renal disease: Secondary | ICD-10-CM | POA: Diagnosis not present

## 2013-07-09 DIAGNOSIS — N039 Chronic nephritic syndrome with unspecified morphologic changes: Secondary | ICD-10-CM | POA: Diagnosis not present

## 2013-07-09 DIAGNOSIS — R7309 Other abnormal glucose: Secondary | ICD-10-CM | POA: Diagnosis not present

## 2013-07-09 DIAGNOSIS — I9589 Other hypotension: Secondary | ICD-10-CM | POA: Diagnosis not present

## 2013-07-10 DIAGNOSIS — R7309 Other abnormal glucose: Secondary | ICD-10-CM | POA: Diagnosis not present

## 2013-07-10 DIAGNOSIS — D631 Anemia in chronic kidney disease: Secondary | ICD-10-CM | POA: Diagnosis not present

## 2013-07-10 DIAGNOSIS — N186 End stage renal disease: Secondary | ICD-10-CM | POA: Diagnosis not present

## 2013-07-10 DIAGNOSIS — E878 Other disorders of electrolyte and fluid balance, not elsewhere classified: Secondary | ICD-10-CM | POA: Diagnosis not present

## 2013-07-10 DIAGNOSIS — N039 Chronic nephritic syndrome with unspecified morphologic changes: Secondary | ICD-10-CM | POA: Diagnosis not present

## 2013-07-10 DIAGNOSIS — I9589 Other hypotension: Secondary | ICD-10-CM | POA: Diagnosis not present

## 2013-07-11 DIAGNOSIS — N186 End stage renal disease: Secondary | ICD-10-CM | POA: Diagnosis not present

## 2013-07-12 DIAGNOSIS — N2581 Secondary hyperparathyroidism of renal origin: Secondary | ICD-10-CM | POA: Diagnosis not present

## 2013-07-12 DIAGNOSIS — N186 End stage renal disease: Secondary | ICD-10-CM | POA: Diagnosis not present

## 2013-07-12 DIAGNOSIS — E878 Other disorders of electrolyte and fluid balance, not elsewhere classified: Secondary | ICD-10-CM | POA: Diagnosis not present

## 2013-07-12 DIAGNOSIS — Z992 Dependence on renal dialysis: Secondary | ICD-10-CM | POA: Diagnosis not present

## 2013-07-12 DIAGNOSIS — D72819 Decreased white blood cell count, unspecified: Secondary | ICD-10-CM

## 2013-07-12 DIAGNOSIS — R7309 Other abnormal glucose: Secondary | ICD-10-CM | POA: Diagnosis not present

## 2013-07-12 DIAGNOSIS — D696 Thrombocytopenia, unspecified: Secondary | ICD-10-CM

## 2013-07-12 DIAGNOSIS — D631 Anemia in chronic kidney disease: Secondary | ICD-10-CM | POA: Diagnosis not present

## 2013-07-12 DIAGNOSIS — I9589 Other hypotension: Secondary | ICD-10-CM | POA: Diagnosis not present

## 2013-07-12 HISTORY — DX: Thrombocytopenia, unspecified: D69.6

## 2013-07-12 HISTORY — DX: Decreased white blood cell count, unspecified: D72.819

## 2013-07-13 DIAGNOSIS — E785 Hyperlipidemia, unspecified: Secondary | ICD-10-CM | POA: Diagnosis not present

## 2013-07-18 ENCOUNTER — Encounter (HOSPITAL_COMMUNITY): Payer: Medicare Other

## 2013-07-24 ENCOUNTER — Telehealth: Payer: Self-pay | Admitting: Hematology and Oncology

## 2013-07-24 NOTE — Telephone Encounter (Signed)
S/W PATIENT AND GAVE NP APPT FOR 07/20 @ 2 W/DR. [place].  REFERRING DR. Marval Regal DX- LOW WBC/LOW PLTS

## 2013-07-31 ENCOUNTER — Telehealth: Payer: Self-pay | Admitting: Hematology and Oncology

## 2013-07-31 ENCOUNTER — Ambulatory Visit (HOSPITAL_BASED_OUTPATIENT_CLINIC_OR_DEPARTMENT_OTHER): Payer: Medicare Other | Admitting: Hematology and Oncology

## 2013-07-31 ENCOUNTER — Ambulatory Visit: Payer: Medicare Other

## 2013-07-31 ENCOUNTER — Encounter: Payer: Self-pay | Admitting: Hematology and Oncology

## 2013-07-31 VITALS — BP 111/59 | HR 78 | Temp 98.5°F | Resp 18 | Ht 72.0 in | Wt 244.5 lb

## 2013-07-31 DIAGNOSIS — D539 Nutritional anemia, unspecified: Secondary | ICD-10-CM

## 2013-07-31 DIAGNOSIS — I2510 Atherosclerotic heart disease of native coronary artery without angina pectoris: Secondary | ICD-10-CM | POA: Diagnosis not present

## 2013-07-31 DIAGNOSIS — N185 Chronic kidney disease, stage 5: Secondary | ICD-10-CM | POA: Diagnosis not present

## 2013-07-31 DIAGNOSIS — D72829 Elevated white blood cell count, unspecified: Secondary | ICD-10-CM

## 2013-07-31 DIAGNOSIS — N039 Chronic nephritic syndrome with unspecified morphologic changes: Secondary | ICD-10-CM | POA: Diagnosis not present

## 2013-07-31 DIAGNOSIS — D696 Thrombocytopenia, unspecified: Secondary | ICD-10-CM | POA: Diagnosis not present

## 2013-07-31 DIAGNOSIS — D631 Anemia in chronic kidney disease: Secondary | ICD-10-CM | POA: Diagnosis not present

## 2013-07-31 DIAGNOSIS — D72819 Decreased white blood cell count, unspecified: Secondary | ICD-10-CM
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
## 2013-07-31 NOTE — Assessment & Plan Note (Signed)
The causes unknown. It could be due to consumption from chronic dialysis or related to liver cirrhosis. As him as his platelet count stays above 100,000, no further workup is needed.

## 2013-07-31 NOTE — Assessment & Plan Note (Signed)
The cause is unknown. I recommend ordering blood work tomorrow after his dialysis tonight to exclude potential causes of hemodilution. If he is white blood cell count return back to normal, and we counseled the rest of his blood work and return appointment. If repeat CBC showed persistent leukopenia, I will order an additional workup.

## 2013-07-31 NOTE — Progress Notes (Signed)
Wife is very upset about scheduling visit. I advised would advise mgmnt., He saw the dr 1st and them fin advocate. No issues with checking in new patient. [place] been [place] of [place],

## 2013-07-31 NOTE — Assessment & Plan Note (Signed)
He has been receiving erythropoietin stimulating agents under the care of his nephrologist. Continue the same.

## 2013-07-31 NOTE — Telephone Encounter (Signed)
gv adn printed appt sched and avs for pt for July  °

## 2013-08-01 ENCOUNTER — Telehealth: Payer: Self-pay | Admitting: Hematology and Oncology

## 2013-08-01 ENCOUNTER — Other Ambulatory Visit (HOSPITAL_BASED_OUTPATIENT_CLINIC_OR_DEPARTMENT_OTHER): Payer: Medicare Other

## 2013-08-01 DIAGNOSIS — N039 Chronic nephritic syndrome with unspecified morphologic changes: Principal | ICD-10-CM

## 2013-08-01 DIAGNOSIS — N185 Chronic kidney disease, stage 5: Secondary | ICD-10-CM

## 2013-08-01 DIAGNOSIS — D631 Anemia in chronic kidney disease: Secondary | ICD-10-CM | POA: Diagnosis not present

## 2013-08-01 DIAGNOSIS — D72819 Decreased white blood cell count, unspecified: Secondary | ICD-10-CM

## 2013-08-01 DIAGNOSIS — D539 Nutritional anemia, unspecified: Secondary | ICD-10-CM

## 2013-08-01 DIAGNOSIS — D696 Thrombocytopenia, unspecified: Secondary | ICD-10-CM

## 2013-08-01 LAB — CBC & DIFF AND RETIC
BASO%: 0.5 % (ref 0.0–2.0)
BASOS ABS: 0 10*3/uL (ref 0.0–0.1)
EOS ABS: 0.2 10*3/uL (ref 0.0–0.5)
EOS%: 3.2 % (ref 0.0–7.0)
HCT: 30.9 % — ABNORMAL LOW (ref 38.4–49.9)
HEMOGLOBIN: 10.5 g/dL — AB (ref 13.0–17.1)
IMMATURE RETIC FRACT: 32 % — AB (ref 3.00–10.60)
LYMPH#: 1.9 10*3/uL (ref 0.9–3.3)
LYMPH%: 31.4 % (ref 14.0–49.0)
MCH: 30.6 pg (ref 27.2–33.4)
MCHC: 34 g/dL (ref 32.0–36.0)
MCV: 90.1 fL (ref 79.3–98.0)
MONO#: 0.5 10*3/uL (ref 0.1–0.9)
MONO%: 8.3 % (ref 0.0–14.0)
NEUT%: 56.6 % (ref 39.0–75.0)
NEUTROS ABS: 3.4 10*3/uL (ref 1.5–6.5)
NRBC: 0 % (ref 0–0)
Platelets: 168 10*3/uL (ref 140–400)
RBC: 3.43 10*6/uL — AB (ref 4.20–5.82)
RDW: 14.6 % (ref 11.0–14.6)
RETIC %: 3.35 % — AB (ref 0.80–1.80)
RETIC CT ABS: 114.91 10*3/uL — AB (ref 34.80–93.90)
WBC: 5.9 10*3/uL (ref 4.0–10.3)

## 2013-08-01 LAB — CHCC SMEAR

## 2013-08-01 NOTE — Telephone Encounter (Signed)
I reviewed CBC from this morning with his wife. CBC came back normal except for mild anemia, not unexpected for your chronic kidney disease. I will cancel the rest of the tests and his return visit. I suspect the cause of the abnormal CBC recently is due to hemodilution.

## 2013-08-09 ENCOUNTER — Encounter: Payer: Self-pay | Admitting: Medical

## 2013-08-09 ENCOUNTER — Ambulatory Visit
Admission: RE | Admit: 2013-08-09 | Discharge: 2013-08-09 | Disposition: A | Discharge status: Home / Self Care | Payer: Medicare Other | Source: Ambulatory Visit | Attending: Medical | Admitting: Medical

## 2013-08-09 ENCOUNTER — Other Ambulatory Visit: Payer: Self-pay | Admitting: Medical

## 2013-08-09 ENCOUNTER — Ambulatory Visit (INDEPENDENT_AMBULATORY_CARE_PROVIDER_SITE_OTHER): Payer: Medicare Other | Admitting: Medical

## 2013-08-09 VITALS — BP 90/58 | HR 84 | Temp 98.9°F | Resp 14 | Wt 245.0 lb

## 2013-08-09 DIAGNOSIS — R309 Painful micturition, unspecified: Secondary | ICD-10-CM

## 2013-08-09 DIAGNOSIS — R05 Cough: Secondary | ICD-10-CM

## 2013-08-09 DIAGNOSIS — N186 End stage renal disease: Secondary | ICD-10-CM | POA: Diagnosis not present

## 2013-08-09 DIAGNOSIS — I2510 Atherosclerotic heart disease of native coronary artery without angina pectoris: Secondary | ICD-10-CM | POA: Diagnosis not present

## 2013-08-09 DIAGNOSIS — R109 Unspecified abdominal pain: Secondary | ICD-10-CM

## 2013-08-09 DIAGNOSIS — R059 Cough, unspecified: Secondary | ICD-10-CM

## 2013-08-09 DIAGNOSIS — Z992 Dependence on renal dialysis: Secondary | ICD-10-CM

## 2013-08-09 DIAGNOSIS — R300 Dysuria: Secondary | ICD-10-CM | POA: Diagnosis not present

## 2013-08-09 LAB — RENAL FUNCTION PANEL
ALBUMIN: 4.4 g/dL (ref 3.5–5.2)
BUN: 38 mg/dL — ABNORMAL HIGH (ref 6–23)
CO2: 28 mEq/L (ref 19–32)
Calcium: 11.1 mg/dL — ABNORMAL HIGH (ref 8.4–10.5)
Chloride: 89 mEq/L — ABNORMAL LOW (ref 96–112)
Creat: 8.1 mg/dL — ABNORMAL HIGH (ref 0.50–1.35)
Glucose, Bld: 109 mg/dL — ABNORMAL HIGH (ref 70–99)
Phosphorus: 5.8 mg/dL — ABNORMAL HIGH (ref 2.3–4.6)
Potassium: 3.1 mEq/L — ABNORMAL LOW (ref 3.5–5.3)
Sodium: 133 mEq/L — ABNORMAL LOW (ref 135–145)

## 2013-08-09 LAB — POCT URINALYSIS DIPSTICK
Bilirubin, UA: NEGATIVE
Glucose, UA: NEGATIVE
Ketones, UA: NEGATIVE
Nitrite, UA: NEGATIVE
Spec Grav, UA: 1.025
UROBILINOGEN UA: NEGATIVE
pH, UA: 5

## 2013-08-09 LAB — CBC WITH DIFFERENTIAL/PLATELET
Basophils Absolute: 0 10*3/uL (ref 0.0–0.1)
EOS ABS: 0 10*3/uL (ref 0.0–0.7)
HEMATOCRIT: 29.8 % — AB (ref 39.0–52.0)
Hemoglobin: 9.8 g/dL — ABNORMAL LOW (ref 13.0–17.0)
Lymphocytes Relative: 16 % (ref 12–46)
Lymphs Abs: 2.2 10*3/uL (ref 0.7–4.0)
MCH: 31 pg (ref 26.0–34.0)
MCHC: 32.9 g/dL (ref 30.0–36.0)
MCV: 94.1 fL (ref 78.0–100.0)
MONO ABS: 0.3 10*3/uL (ref 0.1–1.0)
Monocytes Relative: 2 % — ABNORMAL LOW (ref 3–12)
NEUTROS ABS: 11.4 10*3/uL — AB (ref 1.7–7.7)
Neutrophils Relative %: 82 % — ABNORMAL HIGH (ref 43–77)
Platelets: 125 10*3/uL — ABNORMAL LOW (ref 150–400)
RBC: 3.16 MIL/uL — ABNORMAL LOW (ref 4.22–5.81)
RDW: 16.3 % — AB (ref 11.5–15.5)
WBC: 13.9 10*3/uL — AB (ref 4.0–10.5)

## 2013-08-09 MED ORDER — AMPICILLIN 250 MG PO CAPS: 250.0000 mg | ORAL_CAPSULE | Freq: Four times a day (QID) | ORAL | Status: DC
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
## 2013-08-10 ENCOUNTER — Ambulatory Visit: Payer: Medicare Other | Admitting: Hematology and Oncology

## 2013-08-10 NOTE — Progress Notes (Signed)
Advised patient to call back tomorrow for update.

## 2013-08-11 DIAGNOSIS — Z992 Dependence on renal dialysis: Secondary | ICD-10-CM | POA: Diagnosis not present

## 2013-08-11 DIAGNOSIS — N186 End stage renal disease: Secondary | ICD-10-CM | POA: Diagnosis not present

## 2013-08-12 DIAGNOSIS — N186 End stage renal disease: Secondary | ICD-10-CM | POA: Diagnosis not present

## 2013-08-12 DIAGNOSIS — E878 Other disorders of electrolyte and fluid balance, not elsewhere classified: Secondary | ICD-10-CM | POA: Diagnosis not present

## 2013-08-12 DIAGNOSIS — I9589 Other hypotension: Secondary | ICD-10-CM | POA: Diagnosis not present

## 2013-08-12 LAB — URINE CULTURE: Colony Count: >=100000

## 2013-08-13 ENCOUNTER — Other Ambulatory Visit: Payer: Self-pay | Admitting: Medical

## 2013-08-13 MED ORDER — CIPROFLOXACIN HCL 500 MG PO TABS: 500.0000 mg | ORAL_TABLET | Freq: Every day | ORAL | Status: DC

## 2013-08-14 DIAGNOSIS — D631 Anemia in chronic kidney disease: Secondary | ICD-10-CM | POA: Diagnosis not present

## 2013-08-14 DIAGNOSIS — N039 Chronic nephritic syndrome with unspecified morphologic changes: Secondary | ICD-10-CM | POA: Diagnosis not present

## 2013-08-14 DIAGNOSIS — N2581 Secondary hyperparathyroidism of renal origin: Secondary | ICD-10-CM | POA: Diagnosis not present

## 2013-08-14 DIAGNOSIS — I9589 Other hypotension: Secondary | ICD-10-CM | POA: Diagnosis not present

## 2013-08-14 DIAGNOSIS — N186 End stage renal disease: Secondary | ICD-10-CM | POA: Diagnosis not present

## 2013-08-14 DIAGNOSIS — E878 Other disorders of electrolyte and fluid balance, not elsewhere classified: Secondary | ICD-10-CM | POA: Diagnosis not present

## 2013-08-15 DIAGNOSIS — I9589 Other hypotension: Secondary | ICD-10-CM | POA: Diagnosis not present

## 2013-08-15 DIAGNOSIS — N2581 Secondary hyperparathyroidism of renal origin: Secondary | ICD-10-CM | POA: Diagnosis not present

## 2013-08-15 DIAGNOSIS — N186 End stage renal disease: Secondary | ICD-10-CM | POA: Diagnosis not present

## 2013-08-15 DIAGNOSIS — E878 Other disorders of electrolyte and fluid balance, not elsewhere classified: Secondary | ICD-10-CM | POA: Diagnosis not present

## 2013-08-15 DIAGNOSIS — D631 Anemia in chronic kidney disease: Secondary | ICD-10-CM | POA: Diagnosis not present

## 2013-08-16 DIAGNOSIS — N186 End stage renal disease: Secondary | ICD-10-CM | POA: Diagnosis not present

## 2013-08-16 DIAGNOSIS — E878 Other disorders of electrolyte and fluid balance, not elsewhere classified: Secondary | ICD-10-CM | POA: Diagnosis not present

## 2013-08-16 DIAGNOSIS — I9589 Other hypotension: Secondary | ICD-10-CM | POA: Diagnosis not present

## 2013-08-16 DIAGNOSIS — N2581 Secondary hyperparathyroidism of renal origin: Secondary | ICD-10-CM | POA: Diagnosis not present

## 2013-08-16 DIAGNOSIS — D631 Anemia in chronic kidney disease: Secondary | ICD-10-CM | POA: Diagnosis not present

## 2013-08-21 ENCOUNTER — Ambulatory Visit (INDEPENDENT_AMBULATORY_CARE_PROVIDER_SITE_OTHER): Payer: Medicare Other | Admitting: Medical

## 2013-08-21 ENCOUNTER — Encounter: Payer: Self-pay | Admitting: Medical

## 2013-08-21 VITALS — BP 100/50 | HR 92 | Temp 97.9°F | Resp 16 | Wt 248.0 lb

## 2013-08-21 DIAGNOSIS — N186 End stage renal disease: Secondary | ICD-10-CM | POA: Diagnosis not present

## 2013-08-21 DIAGNOSIS — R05 Cough: Secondary | ICD-10-CM | POA: Diagnosis not present

## 2013-08-21 DIAGNOSIS — R059 Cough, unspecified: Secondary | ICD-10-CM

## 2013-08-21 DIAGNOSIS — N390 Urinary tract infection, site not specified: Secondary | ICD-10-CM | POA: Diagnosis not present

## 2013-08-21 DIAGNOSIS — Z992 Dependence on renal dialysis: Secondary | ICD-10-CM

## 2013-08-21 DIAGNOSIS — I2510 Atherosclerotic heart disease of native coronary artery without angina pectoris: Secondary | ICD-10-CM | POA: Diagnosis not present

## 2013-08-21 DIAGNOSIS — D631 Anemia in chronic kidney disease: Secondary | ICD-10-CM | POA: Diagnosis not present

## 2013-08-21 DIAGNOSIS — N2581 Secondary hyperparathyroidism of renal origin: Secondary | ICD-10-CM | POA: Diagnosis not present

## 2013-08-21 DIAGNOSIS — IMO0001 Reserved for inherently not codable concepts without codable children: Secondary | ICD-10-CM

## 2013-08-21 DIAGNOSIS — I9589 Other hypotension: Secondary | ICD-10-CM | POA: Diagnosis not present

## 2013-08-21 DIAGNOSIS — N039 Chronic nephritic syndrome with unspecified morphologic changes: Secondary | ICD-10-CM | POA: Diagnosis not present

## 2013-08-21 DIAGNOSIS — E878 Other disorders of electrolyte and fluid balance, not elsewhere classified: Secondary | ICD-10-CM | POA: Diagnosis not present

## 2013-08-21 MED ORDER — FAMOTIDINE 20 MG PO TABS: 20.0000 mg | ORAL_TABLET | Freq: Two times a day (BID) | ORAL | Status: DC

## 2013-08-21 MED ORDER — BENZONATATE 200 MG PO CAPS: 200.0000 mg | ORAL_CAPSULE | Freq: Three times a day (TID) | ORAL | Status: DC | PRN

## 2013-08-21 MED ORDER — FLUTICASONE PROPIONATE 50 MCG/ACT NA SUSP: 2.0000 | Freq: Every day | NASAL | Status: DC

## 2013-08-21 NOTE — Progress Notes (Signed)
   Subjective:   [name] is a 63 y.o. male presenting on 08/21/2013 with Cough  Here for cough. He notes having a cough since April when he was in the hospital for heart attack. He was intubated at one point.  He has a shunt in the right arm and does dialysis 3 times a week.  I saw him recently for urinary tract infection, and he took the ampicillin seem to be doing much better in this regard.  He will be having labs this afternoon after dialysis.  He will get me copy of this.  Regarding his recent abnormal blood counts, he is being followed by hematology for this.  He is here today for ongoing cough.   He has an ongoing dry hacking cough, at times is so bad he vomits or even has to pull over off the road to cough.   He denies congestion, sneezing, sore throat, itchy watery eyes, heartburn, froth in the throat, acid indigestion, stomach pain.  He feels fine other than his ongoing cough. No shortness of breath, no wheezing, no leg edema, no chest pain. No other aggravating or relieving factors.  No other complaint.  Review of Systems ROS as in subjective      Objective:     Filed Vitals:   08/21/13 1106  BP: 100/50  Pulse: 92  Temp: 97.9 F (36.6 C)  Resp: 16    General appearance: alert, no distress, WD/WN HEENT: normocephalic, sclerae anicteric, TMs pearly, nares with turbinates seeming swollen but no discharge or erythema, pharynx normal Oral cavity: MMM, no lesions Neck: supple, no lymphadenopathy, no thyromegaly, no masses Heart: RRR, normal S1, S2, no murmurs Lungs: CTA bilaterally, no wheezes, rhonchi, or rales Abdomen: +bs, soft, non tender, non distended, no masses, no hepatomegaly, no splenomegaly Pulses: 2+ symmetric, upper and lower extremities, normal cap refill Extremities no edema     Assessment: Encounter Diagnoses  Name Primary?  . Cough Yes  . Urinary tract infection, site not specified   . Renal dialysis status      Plan: Cough etiology unclear.   Will least try to target acid reflux or allergies, begin Flonase and famotidine, Tessalon Perles as needed.  If no improvement in 1-1.5 wk, refer to ENT unless other new symptoms.  UTI - seems to be much better after Ampicillin  Renal dialysis - he will get me copy of his labs from today.  [name] was seen today for cough.  Diagnoses and associated orders for this visit:  Cough  Urinary tract infection, site not specified  Renal dialysis status  Other Orders - benzonatate (TESSALON) 200 MG capsule; Take 1 capsule (200 mg total) by mouth 3 (three) times daily as needed for cough. - fluticasone (FLONASE) 50 MCG/ACT nasal spray; Place 2 sprays into both nostrils daily. - famotidine (PEPCID) 20 MG tablet; Take 1 tablet (20 mg total) by mouth 2 (two) times daily.    Return call back in 1wk.

## 2013-08-21 NOTE — Patient Instructions (Signed)
Begin Flonase nasal spray 1-2 times daily for nasal turbinate swelling Begin Famotidine at bedtime for stomach acid/reflux Use the Tessalon Perles, 1 perle up to 3 times daily for cough If not seeing much improvement in 1-1.5 week, let me know and we will refer to ENT Please get me a copy of the labs you have done today

## 2013-08-22 DIAGNOSIS — I9589 Other hypotension: Secondary | ICD-10-CM | POA: Diagnosis not present

## 2013-08-22 DIAGNOSIS — D631 Anemia in chronic kidney disease: Secondary | ICD-10-CM | POA: Diagnosis not present

## 2013-08-22 DIAGNOSIS — N186 End stage renal disease: Secondary | ICD-10-CM | POA: Diagnosis not present

## 2013-08-22 DIAGNOSIS — N2581 Secondary hyperparathyroidism of renal origin: Secondary | ICD-10-CM | POA: Diagnosis not present

## 2013-08-22 DIAGNOSIS — E878 Other disorders of electrolyte and fluid balance, not elsewhere classified: Secondary | ICD-10-CM | POA: Diagnosis not present

## 2013-08-23 DIAGNOSIS — E878 Other disorders of electrolyte and fluid balance, not elsewhere classified: Secondary | ICD-10-CM | POA: Diagnosis not present

## 2013-08-23 DIAGNOSIS — N2581 Secondary hyperparathyroidism of renal origin: Secondary | ICD-10-CM | POA: Diagnosis not present

## 2013-08-23 DIAGNOSIS — D631 Anemia in chronic kidney disease: Secondary | ICD-10-CM | POA: Diagnosis not present

## 2013-08-23 DIAGNOSIS — I9589 Other hypotension: Secondary | ICD-10-CM | POA: Diagnosis not present

## 2013-08-23 DIAGNOSIS — N186 End stage renal disease: Secondary | ICD-10-CM | POA: Diagnosis not present

## 2013-08-26 DIAGNOSIS — N186 End stage renal disease: Secondary | ICD-10-CM | POA: Diagnosis not present

## 2013-08-26 DIAGNOSIS — D631 Anemia in chronic kidney disease: Secondary | ICD-10-CM | POA: Diagnosis not present

## 2013-08-26 DIAGNOSIS — N039 Chronic nephritic syndrome with unspecified morphologic changes: Secondary | ICD-10-CM | POA: Diagnosis not present

## 2013-08-26 DIAGNOSIS — N2581 Secondary hyperparathyroidism of renal origin: Secondary | ICD-10-CM | POA: Diagnosis not present

## 2013-08-26 DIAGNOSIS — I9589 Other hypotension: Secondary | ICD-10-CM | POA: Diagnosis not present

## 2013-08-26 DIAGNOSIS — E878 Other disorders of electrolyte and fluid balance, not elsewhere classified: Secondary | ICD-10-CM | POA: Diagnosis not present

## 2013-08-28 DIAGNOSIS — N2581 Secondary hyperparathyroidism of renal origin: Secondary | ICD-10-CM | POA: Diagnosis not present

## 2013-08-28 DIAGNOSIS — N186 End stage renal disease: Secondary | ICD-10-CM | POA: Diagnosis not present

## 2013-08-28 DIAGNOSIS — D631 Anemia in chronic kidney disease: Secondary | ICD-10-CM | POA: Diagnosis not present

## 2013-08-28 DIAGNOSIS — E878 Other disorders of electrolyte and fluid balance, not elsewhere classified: Secondary | ICD-10-CM | POA: Diagnosis not present

## 2013-08-28 DIAGNOSIS — N039 Chronic nephritic syndrome with unspecified morphologic changes: Secondary | ICD-10-CM | POA: Diagnosis not present

## 2013-08-28 DIAGNOSIS — I9589 Other hypotension: Secondary | ICD-10-CM | POA: Diagnosis not present

## 2013-08-29 DIAGNOSIS — N186 End stage renal disease: Secondary | ICD-10-CM | POA: Diagnosis not present

## 2013-08-29 DIAGNOSIS — N2581 Secondary hyperparathyroidism of renal origin: Secondary | ICD-10-CM | POA: Diagnosis not present

## 2013-08-29 DIAGNOSIS — D631 Anemia in chronic kidney disease: Secondary | ICD-10-CM | POA: Diagnosis not present

## 2013-08-29 DIAGNOSIS — I9589 Other hypotension: Secondary | ICD-10-CM | POA: Diagnosis not present

## 2013-08-29 DIAGNOSIS — E878 Other disorders of electrolyte and fluid balance, not elsewhere classified: Secondary | ICD-10-CM | POA: Diagnosis not present

## 2013-08-30 DIAGNOSIS — I9589 Other hypotension: Secondary | ICD-10-CM | POA: Diagnosis not present

## 2013-08-30 DIAGNOSIS — N2581 Secondary hyperparathyroidism of renal origin: Secondary | ICD-10-CM | POA: Diagnosis not present

## 2013-08-30 DIAGNOSIS — N186 End stage renal disease: Secondary | ICD-10-CM | POA: Diagnosis not present

## 2013-08-30 DIAGNOSIS — D631 Anemia in chronic kidney disease: Secondary | ICD-10-CM | POA: Diagnosis not present

## 2013-08-30 DIAGNOSIS — E878 Other disorders of electrolyte and fluid balance, not elsewhere classified: Secondary | ICD-10-CM | POA: Diagnosis not present

## 2013-09-01 ENCOUNTER — Telehealth: Payer: Self-pay | Admitting: Medical

## 2013-09-01 NOTE — Telephone Encounter (Signed)
Please call, he is still having cough  Bad cough, cough to the point that he throws up  [place] said he was going to refer to ENT if cough continues

## 2013-09-01 NOTE — Telephone Encounter (Signed)
Have him stop Pepcid and switched to over-the-counter Prilosec 2 pills per day and call next week if still having difficulty with cough.

## 2013-09-03 DIAGNOSIS — I9589 Other hypotension: Secondary | ICD-10-CM | POA: Diagnosis not present

## 2013-09-03 DIAGNOSIS — D631 Anemia in chronic kidney disease: Secondary | ICD-10-CM | POA: Diagnosis not present

## 2013-09-03 DIAGNOSIS — N039 Chronic nephritic syndrome with unspecified morphologic changes: Secondary | ICD-10-CM | POA: Diagnosis not present

## 2013-09-03 DIAGNOSIS — N2581 Secondary hyperparathyroidism of renal origin: Secondary | ICD-10-CM | POA: Diagnosis not present

## 2013-09-03 DIAGNOSIS — E878 Other disorders of electrolyte and fluid balance, not elsewhere classified: Secondary | ICD-10-CM | POA: Diagnosis not present

## 2013-09-03 DIAGNOSIS — N186 End stage renal disease: Secondary | ICD-10-CM | POA: Diagnosis not present

## 2013-09-04 ENCOUNTER — Telehealth: Payer: Self-pay | Admitting: Medical

## 2013-09-04 DIAGNOSIS — N186 End stage renal disease: Secondary | ICD-10-CM | POA: Diagnosis not present

## 2013-09-04 DIAGNOSIS — I871 Compression of vein: Secondary | ICD-10-CM | POA: Diagnosis not present

## 2013-09-04 DIAGNOSIS — T82898A Other specified complication of vascular prosthetic devices, implants and grafts, initial encounter: Secondary | ICD-10-CM | POA: Diagnosis not present

## 2013-09-04 NOTE — Telephone Encounter (Signed)
Pt notified of Dr. Redmond School recommendations

## 2013-09-04 NOTE — Telephone Encounter (Signed)
Pt called and requested a referral to ENT. States he is still coughing.

## 2013-09-04 NOTE — Telephone Encounter (Signed)
Go ahead and refer to ENT for cough

## 2013-09-05 DIAGNOSIS — N039 Chronic nephritic syndrome with unspecified morphologic changes: Secondary | ICD-10-CM | POA: Diagnosis not present

## 2013-09-05 DIAGNOSIS — I9589 Other hypotension: Secondary | ICD-10-CM | POA: Diagnosis not present

## 2013-09-05 DIAGNOSIS — N186 End stage renal disease: Secondary | ICD-10-CM | POA: Diagnosis not present

## 2013-09-05 DIAGNOSIS — D631 Anemia in chronic kidney disease: Secondary | ICD-10-CM | POA: Diagnosis not present

## 2013-09-05 DIAGNOSIS — N2581 Secondary hyperparathyroidism of renal origin: Secondary | ICD-10-CM | POA: Diagnosis not present

## 2013-09-05 DIAGNOSIS — E878 Other disorders of electrolyte and fluid balance, not elsewhere classified: Secondary | ICD-10-CM | POA: Diagnosis not present

## 2013-09-05 NOTE — Telephone Encounter (Signed)
Working on referral. [employer]

## 2013-09-07 DIAGNOSIS — H6120 Impacted cerumen, unspecified ear: Secondary | ICD-10-CM | POA: Diagnosis not present

## 2013-09-07 DIAGNOSIS — R490 Dysphonia: Secondary | ICD-10-CM | POA: Diagnosis not present

## 2013-09-08 DIAGNOSIS — D631 Anemia in chronic kidney disease: Secondary | ICD-10-CM | POA: Diagnosis not present

## 2013-09-08 DIAGNOSIS — N2581 Secondary hyperparathyroidism of renal origin: Secondary | ICD-10-CM | POA: Diagnosis not present

## 2013-09-08 DIAGNOSIS — N186 End stage renal disease: Secondary | ICD-10-CM | POA: Diagnosis not present

## 2013-09-08 DIAGNOSIS — E878 Other disorders of electrolyte and fluid balance, not elsewhere classified: Secondary | ICD-10-CM | POA: Diagnosis not present

## 2013-09-08 DIAGNOSIS — I9589 Other hypotension: Secondary | ICD-10-CM | POA: Diagnosis not present

## 2013-09-11 DIAGNOSIS — N186 End stage renal disease: Secondary | ICD-10-CM | POA: Diagnosis not present

## 2013-09-12 DIAGNOSIS — D509 Iron deficiency anemia, unspecified: Secondary | ICD-10-CM | POA: Diagnosis not present

## 2013-09-12 DIAGNOSIS — I9589 Other hypotension: Secondary | ICD-10-CM | POA: Diagnosis not present

## 2013-09-12 DIAGNOSIS — N039 Chronic nephritic syndrome with unspecified morphologic changes: Secondary | ICD-10-CM | POA: Diagnosis not present

## 2013-09-12 DIAGNOSIS — Z23 Encounter for immunization: Secondary | ICD-10-CM | POA: Diagnosis not present

## 2013-09-12 DIAGNOSIS — D631 Anemia in chronic kidney disease: Secondary | ICD-10-CM | POA: Diagnosis not present

## 2013-09-12 DIAGNOSIS — N186 End stage renal disease: Secondary | ICD-10-CM | POA: Diagnosis not present

## 2013-09-12 DIAGNOSIS — E878 Other disorders of electrolyte and fluid balance, not elsewhere classified: Secondary | ICD-10-CM | POA: Diagnosis not present

## 2013-09-15 DIAGNOSIS — N186 End stage renal disease: Secondary | ICD-10-CM | POA: Diagnosis not present

## 2013-09-15 DIAGNOSIS — D509 Iron deficiency anemia, unspecified: Secondary | ICD-10-CM | POA: Diagnosis not present

## 2013-09-15 DIAGNOSIS — Z23 Encounter for immunization: Secondary | ICD-10-CM | POA: Diagnosis not present

## 2013-09-15 DIAGNOSIS — D631 Anemia in chronic kidney disease: Secondary | ICD-10-CM | POA: Diagnosis not present

## 2013-09-15 DIAGNOSIS — N039 Chronic nephritic syndrome with unspecified morphologic changes: Secondary | ICD-10-CM | POA: Diagnosis not present

## 2013-09-16 DIAGNOSIS — N186 End stage renal disease: Secondary | ICD-10-CM | POA: Diagnosis not present

## 2013-09-16 DIAGNOSIS — N039 Chronic nephritic syndrome with unspecified morphologic changes: Secondary | ICD-10-CM | POA: Diagnosis not present

## 2013-09-16 DIAGNOSIS — D631 Anemia in chronic kidney disease: Secondary | ICD-10-CM | POA: Diagnosis not present

## 2013-09-16 DIAGNOSIS — Z23 Encounter for immunization: Secondary | ICD-10-CM | POA: Diagnosis not present

## 2013-09-16 DIAGNOSIS — D509 Iron deficiency anemia, unspecified: Secondary | ICD-10-CM | POA: Diagnosis not present

## 2013-09-18 DIAGNOSIS — Z23 Encounter for immunization: Secondary | ICD-10-CM | POA: Diagnosis not present

## 2013-09-18 DIAGNOSIS — D631 Anemia in chronic kidney disease: Secondary | ICD-10-CM | POA: Diagnosis not present

## 2013-09-18 DIAGNOSIS — N039 Chronic nephritic syndrome with unspecified morphologic changes: Secondary | ICD-10-CM | POA: Diagnosis not present

## 2013-09-18 DIAGNOSIS — D509 Iron deficiency anemia, unspecified: Secondary | ICD-10-CM | POA: Diagnosis not present

## 2013-09-18 DIAGNOSIS — N186 End stage renal disease: Secondary | ICD-10-CM | POA: Diagnosis not present

## 2013-09-20 DIAGNOSIS — D631 Anemia in chronic kidney disease: Secondary | ICD-10-CM | POA: Diagnosis not present

## 2013-09-20 DIAGNOSIS — N186 End stage renal disease: Secondary | ICD-10-CM | POA: Diagnosis not present

## 2013-09-20 DIAGNOSIS — Z23 Encounter for immunization: Secondary | ICD-10-CM | POA: Diagnosis not present

## 2013-09-20 DIAGNOSIS — N039 Chronic nephritic syndrome with unspecified morphologic changes: Secondary | ICD-10-CM | POA: Diagnosis not present

## 2013-09-20 DIAGNOSIS — D509 Iron deficiency anemia, unspecified: Secondary | ICD-10-CM | POA: Diagnosis not present

## 2013-09-23 DIAGNOSIS — Z23 Encounter for immunization: Secondary | ICD-10-CM | POA: Diagnosis not present

## 2013-09-23 DIAGNOSIS — N186 End stage renal disease: Secondary | ICD-10-CM | POA: Diagnosis not present

## 2013-09-23 DIAGNOSIS — D509 Iron deficiency anemia, unspecified: Secondary | ICD-10-CM | POA: Diagnosis not present

## 2013-09-23 DIAGNOSIS — D631 Anemia in chronic kidney disease: Secondary | ICD-10-CM | POA: Diagnosis not present

## 2013-09-24 DIAGNOSIS — N039 Chronic nephritic syndrome with unspecified morphologic changes: Secondary | ICD-10-CM | POA: Diagnosis not present

## 2013-09-24 DIAGNOSIS — D509 Iron deficiency anemia, unspecified: Secondary | ICD-10-CM | POA: Diagnosis not present

## 2013-09-24 DIAGNOSIS — D631 Anemia in chronic kidney disease: Secondary | ICD-10-CM | POA: Diagnosis not present

## 2013-09-24 DIAGNOSIS — N186 End stage renal disease: Secondary | ICD-10-CM | POA: Diagnosis not present

## 2013-09-24 DIAGNOSIS — Z23 Encounter for immunization: Secondary | ICD-10-CM | POA: Diagnosis not present

## 2013-09-25 DIAGNOSIS — D509 Iron deficiency anemia, unspecified: Secondary | ICD-10-CM | POA: Diagnosis not present

## 2013-09-25 DIAGNOSIS — D631 Anemia in chronic kidney disease: Secondary | ICD-10-CM | POA: Diagnosis not present

## 2013-09-25 DIAGNOSIS — N186 End stage renal disease: Secondary | ICD-10-CM | POA: Diagnosis not present

## 2013-09-25 DIAGNOSIS — Z23 Encounter for immunization: Secondary | ICD-10-CM | POA: Diagnosis not present

## 2013-09-26 DIAGNOSIS — D631 Anemia in chronic kidney disease: Secondary | ICD-10-CM | POA: Diagnosis not present

## 2013-09-26 DIAGNOSIS — N039 Chronic nephritic syndrome with unspecified morphologic changes: Secondary | ICD-10-CM | POA: Diagnosis not present

## 2013-09-26 DIAGNOSIS — N186 End stage renal disease: Secondary | ICD-10-CM | POA: Diagnosis not present

## 2013-09-26 DIAGNOSIS — D509 Iron deficiency anemia, unspecified: Secondary | ICD-10-CM | POA: Diagnosis not present

## 2013-09-26 DIAGNOSIS — Z23 Encounter for immunization: Secondary | ICD-10-CM | POA: Diagnosis not present

## 2013-09-27 ENCOUNTER — Telehealth: Payer: Self-pay | Admitting: Neurology

## 2013-09-27 ENCOUNTER — Encounter: Payer: Self-pay | Admitting: Neurology

## 2013-09-27 NOTE — Telephone Encounter (Signed)
I tried calling patient using both numbers listed to scheduled his post CPAP visit with Dr. Brett Fairy but was unable to leave a message due to mailbox isn't set-up.

## 2013-09-28 DIAGNOSIS — N186 End stage renal disease: Secondary | ICD-10-CM | POA: Diagnosis not present

## 2013-09-28 DIAGNOSIS — D509 Iron deficiency anemia, unspecified: Secondary | ICD-10-CM | POA: Diagnosis not present

## 2013-09-28 DIAGNOSIS — D631 Anemia in chronic kidney disease: Secondary | ICD-10-CM | POA: Diagnosis not present

## 2013-09-28 DIAGNOSIS — Z23 Encounter for immunization: Secondary | ICD-10-CM | POA: Diagnosis not present

## 2013-10-01 DIAGNOSIS — Z23 Encounter for immunization: Secondary | ICD-10-CM | POA: Diagnosis not present

## 2013-10-01 DIAGNOSIS — D631 Anemia in chronic kidney disease: Secondary | ICD-10-CM | POA: Diagnosis not present

## 2013-10-01 DIAGNOSIS — N186 End stage renal disease: Secondary | ICD-10-CM | POA: Diagnosis not present

## 2013-10-01 DIAGNOSIS — D509 Iron deficiency anemia, unspecified: Secondary | ICD-10-CM | POA: Diagnosis not present

## 2013-10-03 DIAGNOSIS — D509 Iron deficiency anemia, unspecified: Secondary | ICD-10-CM | POA: Diagnosis not present

## 2013-10-03 DIAGNOSIS — Z23 Encounter for immunization: Secondary | ICD-10-CM | POA: Diagnosis not present

## 2013-10-03 DIAGNOSIS — D631 Anemia in chronic kidney disease: Secondary | ICD-10-CM | POA: Diagnosis not present

## 2013-10-03 DIAGNOSIS — N186 End stage renal disease: Secondary | ICD-10-CM | POA: Diagnosis not present

## 2013-10-05 DIAGNOSIS — Z23 Encounter for immunization: Secondary | ICD-10-CM | POA: Diagnosis not present

## 2013-10-05 DIAGNOSIS — N186 End stage renal disease: Secondary | ICD-10-CM | POA: Diagnosis not present

## 2013-10-05 DIAGNOSIS — D631 Anemia in chronic kidney disease: Secondary | ICD-10-CM | POA: Diagnosis not present

## 2013-10-05 DIAGNOSIS — D509 Iron deficiency anemia, unspecified: Secondary | ICD-10-CM | POA: Diagnosis not present

## 2013-10-08 DIAGNOSIS — Z23 Encounter for immunization: Secondary | ICD-10-CM | POA: Diagnosis not present

## 2013-10-08 DIAGNOSIS — D509 Iron deficiency anemia, unspecified: Secondary | ICD-10-CM | POA: Diagnosis not present

## 2013-10-08 DIAGNOSIS — N186 End stage renal disease: Secondary | ICD-10-CM | POA: Diagnosis not present

## 2013-10-08 DIAGNOSIS — D631 Anemia in chronic kidney disease: Secondary | ICD-10-CM | POA: Diagnosis not present

## 2013-10-10 DIAGNOSIS — D631 Anemia in chronic kidney disease: Secondary | ICD-10-CM | POA: Diagnosis not present

## 2013-10-10 DIAGNOSIS — D509 Iron deficiency anemia, unspecified: Secondary | ICD-10-CM | POA: Diagnosis not present

## 2013-10-10 DIAGNOSIS — Z23 Encounter for immunization: Secondary | ICD-10-CM | POA: Diagnosis not present

## 2013-10-10 DIAGNOSIS — N186 End stage renal disease: Secondary | ICD-10-CM | POA: Diagnosis not present

## 2013-10-10 DIAGNOSIS — N039 Chronic nephritic syndrome with unspecified morphologic changes: Secondary | ICD-10-CM | POA: Diagnosis not present

## 2013-10-11 DIAGNOSIS — N186 End stage renal disease: Secondary | ICD-10-CM | POA: Diagnosis not present

## 2013-10-12 DIAGNOSIS — D631 Anemia in chronic kidney disease: Secondary | ICD-10-CM | POA: Diagnosis not present

## 2013-10-12 DIAGNOSIS — D509 Iron deficiency anemia, unspecified: Secondary | ICD-10-CM | POA: Diagnosis not present

## 2013-10-12 DIAGNOSIS — E878 Other disorders of electrolyte and fluid balance, not elsewhere classified: Secondary | ICD-10-CM | POA: Diagnosis not present

## 2013-10-12 DIAGNOSIS — I9589 Other hypotension: Secondary | ICD-10-CM | POA: Diagnosis not present

## 2013-10-12 DIAGNOSIS — N186 End stage renal disease: Secondary | ICD-10-CM | POA: Diagnosis not present

## 2013-10-13 DIAGNOSIS — E878 Other disorders of electrolyte and fluid balance, not elsewhere classified: Secondary | ICD-10-CM | POA: Diagnosis not present

## 2013-10-13 DIAGNOSIS — D631 Anemia in chronic kidney disease: Secondary | ICD-10-CM | POA: Diagnosis not present

## 2013-10-13 DIAGNOSIS — N186 End stage renal disease: Secondary | ICD-10-CM | POA: Diagnosis not present

## 2013-10-13 DIAGNOSIS — I9589 Other hypotension: Secondary | ICD-10-CM | POA: Diagnosis not present

## 2013-10-13 DIAGNOSIS — D509 Iron deficiency anemia, unspecified: Secondary | ICD-10-CM | POA: Diagnosis not present

## 2013-10-14 DIAGNOSIS — D509 Iron deficiency anemia, unspecified: Secondary | ICD-10-CM | POA: Diagnosis not present

## 2013-10-14 DIAGNOSIS — E878 Other disorders of electrolyte and fluid balance, not elsewhere classified: Secondary | ICD-10-CM | POA: Diagnosis not present

## 2013-10-14 DIAGNOSIS — I9589 Other hypotension: Secondary | ICD-10-CM | POA: Diagnosis not present

## 2013-10-14 DIAGNOSIS — D631 Anemia in chronic kidney disease: Secondary | ICD-10-CM | POA: Diagnosis not present

## 2013-10-14 DIAGNOSIS — N186 End stage renal disease: Secondary | ICD-10-CM | POA: Diagnosis not present

## 2013-10-16 DIAGNOSIS — N186 End stage renal disease: Secondary | ICD-10-CM | POA: Diagnosis not present

## 2013-10-16 DIAGNOSIS — D631 Anemia in chronic kidney disease: Secondary | ICD-10-CM | POA: Diagnosis not present

## 2013-10-16 DIAGNOSIS — E878 Other disorders of electrolyte and fluid balance, not elsewhere classified: Secondary | ICD-10-CM | POA: Diagnosis not present

## 2013-10-16 DIAGNOSIS — D509 Iron deficiency anemia, unspecified: Secondary | ICD-10-CM | POA: Diagnosis not present

## 2013-10-16 DIAGNOSIS — I9589 Other hypotension: Secondary | ICD-10-CM | POA: Diagnosis not present

## 2013-10-18 DIAGNOSIS — N186 End stage renal disease: Secondary | ICD-10-CM | POA: Diagnosis not present

## 2013-10-18 DIAGNOSIS — I9589 Other hypotension: Secondary | ICD-10-CM | POA: Diagnosis not present

## 2013-10-18 DIAGNOSIS — E878 Other disorders of electrolyte and fluid balance, not elsewhere classified: Secondary | ICD-10-CM | POA: Diagnosis not present

## 2013-10-18 DIAGNOSIS — D509 Iron deficiency anemia, unspecified: Secondary | ICD-10-CM | POA: Diagnosis not present

## 2013-10-18 DIAGNOSIS — D631 Anemia in chronic kidney disease: Secondary | ICD-10-CM | POA: Diagnosis not present

## 2013-10-20 DIAGNOSIS — D509 Iron deficiency anemia, unspecified: Secondary | ICD-10-CM | POA: Diagnosis not present

## 2013-10-20 DIAGNOSIS — I9589 Other hypotension: Secondary | ICD-10-CM | POA: Diagnosis not present

## 2013-10-20 DIAGNOSIS — N186 End stage renal disease: Secondary | ICD-10-CM | POA: Diagnosis not present

## 2013-10-20 DIAGNOSIS — D631 Anemia in chronic kidney disease: Secondary | ICD-10-CM | POA: Diagnosis not present

## 2013-10-20 DIAGNOSIS — E878 Other disorders of electrolyte and fluid balance, not elsewhere classified: Secondary | ICD-10-CM | POA: Diagnosis not present

## 2013-10-21 DIAGNOSIS — N186 End stage renal disease: Secondary | ICD-10-CM | POA: Diagnosis not present

## 2013-10-21 DIAGNOSIS — E878 Other disorders of electrolyte and fluid balance, not elsewhere classified: Secondary | ICD-10-CM | POA: Diagnosis not present

## 2013-10-21 DIAGNOSIS — D631 Anemia in chronic kidney disease: Secondary | ICD-10-CM | POA: Diagnosis not present

## 2013-10-21 DIAGNOSIS — D509 Iron deficiency anemia, unspecified: Secondary | ICD-10-CM | POA: Diagnosis not present

## 2013-10-21 DIAGNOSIS — I9589 Other hypotension: Secondary | ICD-10-CM | POA: Diagnosis not present

## 2013-10-22 DIAGNOSIS — D509 Iron deficiency anemia, unspecified: Secondary | ICD-10-CM | POA: Diagnosis not present

## 2013-10-22 DIAGNOSIS — E878 Other disorders of electrolyte and fluid balance, not elsewhere classified: Secondary | ICD-10-CM | POA: Diagnosis not present

## 2013-10-22 DIAGNOSIS — I9589 Other hypotension: Secondary | ICD-10-CM | POA: Diagnosis not present

## 2013-10-22 DIAGNOSIS — D631 Anemia in chronic kidney disease: Secondary | ICD-10-CM | POA: Diagnosis not present

## 2013-10-22 DIAGNOSIS — N186 End stage renal disease: Secondary | ICD-10-CM | POA: Diagnosis not present

## 2013-10-23 DIAGNOSIS — D509 Iron deficiency anemia, unspecified: Secondary | ICD-10-CM | POA: Diagnosis not present

## 2013-10-23 DIAGNOSIS — E878 Other disorders of electrolyte and fluid balance, not elsewhere classified: Secondary | ICD-10-CM | POA: Diagnosis not present

## 2013-10-23 DIAGNOSIS — D631 Anemia in chronic kidney disease: Secondary | ICD-10-CM | POA: Diagnosis not present

## 2013-10-23 DIAGNOSIS — N186 End stage renal disease: Secondary | ICD-10-CM | POA: Diagnosis not present

## 2013-10-23 DIAGNOSIS — I9589 Other hypotension: Secondary | ICD-10-CM | POA: Diagnosis not present

## 2013-10-24 DIAGNOSIS — E878 Other disorders of electrolyte and fluid balance, not elsewhere classified: Secondary | ICD-10-CM | POA: Diagnosis not present

## 2013-10-24 DIAGNOSIS — I9589 Other hypotension: Secondary | ICD-10-CM | POA: Diagnosis not present

## 2013-10-24 DIAGNOSIS — N186 End stage renal disease: Secondary | ICD-10-CM | POA: Diagnosis not present

## 2013-10-24 DIAGNOSIS — D631 Anemia in chronic kidney disease: Secondary | ICD-10-CM | POA: Diagnosis not present

## 2013-10-24 DIAGNOSIS — D509 Iron deficiency anemia, unspecified: Secondary | ICD-10-CM | POA: Diagnosis not present

## 2013-10-25 DIAGNOSIS — D509 Iron deficiency anemia, unspecified: Secondary | ICD-10-CM | POA: Diagnosis not present

## 2013-10-25 DIAGNOSIS — N186 End stage renal disease: Secondary | ICD-10-CM | POA: Diagnosis not present

## 2013-10-25 DIAGNOSIS — D631 Anemia in chronic kidney disease: Secondary | ICD-10-CM | POA: Diagnosis not present

## 2013-10-25 DIAGNOSIS — I9589 Other hypotension: Secondary | ICD-10-CM | POA: Diagnosis not present

## 2013-10-25 DIAGNOSIS — E878 Other disorders of electrolyte and fluid balance, not elsewhere classified: Secondary | ICD-10-CM | POA: Diagnosis not present

## 2013-10-26 ENCOUNTER — Telehealth: Payer: Self-pay | Admitting: *Deleted

## 2013-10-26 NOTE — Telephone Encounter (Signed)
Ashlea calling in to make sure that Dr Brett Fairy has received order for patient's BiPAP machine, and if she can sign and fax it back because they can't provide patient the machine without the faxed order.

## 2013-10-26 NOTE — Telephone Encounter (Signed)
Ashlea ? Which DME is that person associated with ?  BiPAP order should be in EPIC. Send to [facility], [facility]

## 2013-10-27 DIAGNOSIS — Z992 Dependence on renal dialysis: Secondary | ICD-10-CM | POA: Diagnosis not present

## 2013-10-27 DIAGNOSIS — E878 Other disorders of electrolyte and fluid balance, not elsewhere classified: Secondary | ICD-10-CM | POA: Diagnosis not present

## 2013-10-27 DIAGNOSIS — N186 End stage renal disease: Secondary | ICD-10-CM | POA: Diagnosis not present

## 2013-10-27 DIAGNOSIS — N2581 Secondary hyperparathyroidism of renal origin: Secondary | ICD-10-CM | POA: Diagnosis not present

## 2013-10-27 DIAGNOSIS — D509 Iron deficiency anemia, unspecified: Secondary | ICD-10-CM | POA: Diagnosis not present

## 2013-10-27 DIAGNOSIS — D631 Anemia in chronic kidney disease: Secondary | ICD-10-CM | POA: Diagnosis not present

## 2013-10-27 DIAGNOSIS — I9589 Other hypotension: Secondary | ICD-10-CM | POA: Diagnosis not present

## 2013-10-30 DIAGNOSIS — E878 Other disorders of electrolyte and fluid balance, not elsewhere classified: Secondary | ICD-10-CM | POA: Diagnosis not present

## 2013-10-30 DIAGNOSIS — D509 Iron deficiency anemia, unspecified: Secondary | ICD-10-CM | POA: Diagnosis not present

## 2013-10-30 DIAGNOSIS — N186 End stage renal disease: Secondary | ICD-10-CM | POA: Diagnosis not present

## 2013-10-30 DIAGNOSIS — D631 Anemia in chronic kidney disease: Secondary | ICD-10-CM | POA: Diagnosis not present

## 2013-10-30 DIAGNOSIS — I9589 Other hypotension: Secondary | ICD-10-CM | POA: Diagnosis not present

## 2013-11-01 DIAGNOSIS — N186 End stage renal disease: Secondary | ICD-10-CM | POA: Diagnosis not present

## 2013-11-01 DIAGNOSIS — D631 Anemia in chronic kidney disease: Secondary | ICD-10-CM | POA: Diagnosis not present

## 2013-11-01 DIAGNOSIS — I9589 Other hypotension: Secondary | ICD-10-CM | POA: Diagnosis not present

## 2013-11-01 DIAGNOSIS — D509 Iron deficiency anemia, unspecified: Secondary | ICD-10-CM | POA: Diagnosis not present

## 2013-11-01 DIAGNOSIS — E878 Other disorders of electrolyte and fluid balance, not elsewhere classified: Secondary | ICD-10-CM | POA: Diagnosis not present

## 2013-11-02 DIAGNOSIS — N186 End stage renal disease: Secondary | ICD-10-CM | POA: Diagnosis not present

## 2013-11-02 DIAGNOSIS — I9589 Other hypotension: Secondary | ICD-10-CM | POA: Diagnosis not present

## 2013-11-02 DIAGNOSIS — D631 Anemia in chronic kidney disease: Secondary | ICD-10-CM | POA: Diagnosis not present

## 2013-11-02 DIAGNOSIS — E878 Other disorders of electrolyte and fluid balance, not elsewhere classified: Secondary | ICD-10-CM | POA: Diagnosis not present

## 2013-11-02 DIAGNOSIS — D509 Iron deficiency anemia, unspecified: Secondary | ICD-10-CM | POA: Diagnosis not present

## 2013-11-03 DIAGNOSIS — E878 Other disorders of electrolyte and fluid balance, not elsewhere classified: Secondary | ICD-10-CM | POA: Diagnosis not present

## 2013-11-03 DIAGNOSIS — D631 Anemia in chronic kidney disease: Secondary | ICD-10-CM | POA: Diagnosis not present

## 2013-11-03 DIAGNOSIS — D509 Iron deficiency anemia, unspecified: Secondary | ICD-10-CM | POA: Diagnosis not present

## 2013-11-03 DIAGNOSIS — N186 End stage renal disease: Secondary | ICD-10-CM | POA: Diagnosis not present

## 2013-11-03 DIAGNOSIS — I9589 Other hypotension: Secondary | ICD-10-CM | POA: Diagnosis not present

## 2013-11-05 DIAGNOSIS — I9589 Other hypotension: Secondary | ICD-10-CM | POA: Diagnosis not present

## 2013-11-05 DIAGNOSIS — D509 Iron deficiency anemia, unspecified: Secondary | ICD-10-CM | POA: Diagnosis not present

## 2013-11-05 DIAGNOSIS — N186 End stage renal disease: Secondary | ICD-10-CM | POA: Diagnosis not present

## 2013-11-05 DIAGNOSIS — E878 Other disorders of electrolyte and fluid balance, not elsewhere classified: Secondary | ICD-10-CM | POA: Diagnosis not present

## 2013-11-05 DIAGNOSIS — D631 Anemia in chronic kidney disease: Secondary | ICD-10-CM | POA: Diagnosis not present

## 2013-11-07 DIAGNOSIS — E878 Other disorders of electrolyte and fluid balance, not elsewhere classified: Secondary | ICD-10-CM | POA: Diagnosis not present

## 2013-11-07 DIAGNOSIS — D509 Iron deficiency anemia, unspecified: Secondary | ICD-10-CM | POA: Diagnosis not present

## 2013-11-07 DIAGNOSIS — I9589 Other hypotension: Secondary | ICD-10-CM | POA: Diagnosis not present

## 2013-11-07 DIAGNOSIS — N186 End stage renal disease: Secondary | ICD-10-CM | POA: Diagnosis not present

## 2013-11-07 DIAGNOSIS — D631 Anemia in chronic kidney disease: Secondary | ICD-10-CM | POA: Diagnosis not present

## 2013-11-08 DIAGNOSIS — N186 End stage renal disease: Secondary | ICD-10-CM | POA: Diagnosis not present

## 2013-11-08 DIAGNOSIS — D509 Iron deficiency anemia, unspecified: Secondary | ICD-10-CM | POA: Diagnosis not present

## 2013-11-08 DIAGNOSIS — E878 Other disorders of electrolyte and fluid balance, not elsewhere classified: Secondary | ICD-10-CM | POA: Diagnosis not present

## 2013-11-08 DIAGNOSIS — I9589 Other hypotension: Secondary | ICD-10-CM | POA: Diagnosis not present

## 2013-11-08 DIAGNOSIS — D631 Anemia in chronic kidney disease: Secondary | ICD-10-CM | POA: Diagnosis not present

## 2013-11-09 DIAGNOSIS — D509 Iron deficiency anemia, unspecified: Secondary | ICD-10-CM | POA: Diagnosis not present

## 2013-11-09 DIAGNOSIS — N186 End stage renal disease: Secondary | ICD-10-CM | POA: Diagnosis not present

## 2013-11-09 DIAGNOSIS — D631 Anemia in chronic kidney disease: Secondary | ICD-10-CM | POA: Diagnosis not present

## 2013-11-09 DIAGNOSIS — E878 Other disorders of electrolyte and fluid balance, not elsewhere classified: Secondary | ICD-10-CM | POA: Diagnosis not present

## 2013-11-09 DIAGNOSIS — I9589 Other hypotension: Secondary | ICD-10-CM | POA: Diagnosis not present

## 2013-11-11 DIAGNOSIS — D631 Anemia in chronic kidney disease: Secondary | ICD-10-CM | POA: Diagnosis not present

## 2013-11-11 DIAGNOSIS — E878 Other disorders of electrolyte and fluid balance, not elsewhere classified: Secondary | ICD-10-CM | POA: Diagnosis not present

## 2013-11-11 DIAGNOSIS — Z992 Dependence on renal dialysis: Secondary | ICD-10-CM | POA: Diagnosis not present

## 2013-11-11 DIAGNOSIS — I9589 Other hypotension: Secondary | ICD-10-CM | POA: Diagnosis not present

## 2013-11-11 DIAGNOSIS — N186 End stage renal disease: Secondary | ICD-10-CM | POA: Diagnosis not present

## 2013-11-11 DIAGNOSIS — D509 Iron deficiency anemia, unspecified: Secondary | ICD-10-CM | POA: Diagnosis not present

## 2013-11-14 DIAGNOSIS — N186 End stage renal disease: Secondary | ICD-10-CM | POA: Diagnosis not present

## 2013-11-14 DIAGNOSIS — D509 Iron deficiency anemia, unspecified: Secondary | ICD-10-CM | POA: Diagnosis not present

## 2013-11-14 DIAGNOSIS — D631 Anemia in chronic kidney disease: Secondary | ICD-10-CM | POA: Diagnosis not present

## 2013-11-14 DIAGNOSIS — I9589 Other hypotension: Secondary | ICD-10-CM | POA: Diagnosis not present

## 2013-11-14 DIAGNOSIS — E878 Other disorders of electrolyte and fluid balance, not elsewhere classified: Secondary | ICD-10-CM | POA: Diagnosis not present

## 2013-11-19 DIAGNOSIS — D509 Iron deficiency anemia, unspecified: Secondary | ICD-10-CM | POA: Diagnosis not present

## 2013-11-19 DIAGNOSIS — I9589 Other hypotension: Secondary | ICD-10-CM | POA: Diagnosis not present

## 2013-11-19 DIAGNOSIS — D631 Anemia in chronic kidney disease: Secondary | ICD-10-CM | POA: Diagnosis not present

## 2013-11-19 DIAGNOSIS — E878 Other disorders of electrolyte and fluid balance, not elsewhere classified: Secondary | ICD-10-CM | POA: Diagnosis not present

## 2013-11-19 DIAGNOSIS — N186 End stage renal disease: Secondary | ICD-10-CM | POA: Diagnosis not present

## 2013-11-20 DIAGNOSIS — E878 Other disorders of electrolyte and fluid balance, not elsewhere classified: Secondary | ICD-10-CM | POA: Diagnosis not present

## 2013-11-20 DIAGNOSIS — I9589 Other hypotension: Secondary | ICD-10-CM | POA: Diagnosis not present

## 2013-11-20 DIAGNOSIS — D631 Anemia in chronic kidney disease: Secondary | ICD-10-CM | POA: Diagnosis not present

## 2013-11-20 DIAGNOSIS — N186 End stage renal disease: Secondary | ICD-10-CM | POA: Diagnosis not present

## 2013-11-20 DIAGNOSIS — D509 Iron deficiency anemia, unspecified: Secondary | ICD-10-CM | POA: Diagnosis not present

## 2013-11-21 DIAGNOSIS — N186 End stage renal disease: Secondary | ICD-10-CM | POA: Diagnosis not present

## 2013-11-21 DIAGNOSIS — I9589 Other hypotension: Secondary | ICD-10-CM | POA: Diagnosis not present

## 2013-11-21 DIAGNOSIS — D631 Anemia in chronic kidney disease: Secondary | ICD-10-CM | POA: Diagnosis not present

## 2013-11-21 DIAGNOSIS — D509 Iron deficiency anemia, unspecified: Secondary | ICD-10-CM | POA: Diagnosis not present

## 2013-11-21 DIAGNOSIS — E878 Other disorders of electrolyte and fluid balance, not elsewhere classified: Secondary | ICD-10-CM | POA: Diagnosis not present

## 2013-11-24 ENCOUNTER — Ambulatory Visit: Payer: Medicare Other | Admitting: Neurology

## 2013-11-24 DIAGNOSIS — I9589 Other hypotension: Secondary | ICD-10-CM | POA: Diagnosis not present

## 2013-11-24 DIAGNOSIS — D509 Iron deficiency anemia, unspecified: Secondary | ICD-10-CM | POA: Diagnosis not present

## 2013-11-24 DIAGNOSIS — D631 Anemia in chronic kidney disease: Secondary | ICD-10-CM | POA: Diagnosis not present

## 2013-11-24 DIAGNOSIS — N186 End stage renal disease: Secondary | ICD-10-CM | POA: Diagnosis not present

## 2013-11-24 DIAGNOSIS — E878 Other disorders of electrolyte and fluid balance, not elsewhere classified: Secondary | ICD-10-CM | POA: Diagnosis not present

## 2013-11-26 DIAGNOSIS — N186 End stage renal disease: Secondary | ICD-10-CM | POA: Diagnosis not present

## 2013-11-26 DIAGNOSIS — E878 Other disorders of electrolyte and fluid balance, not elsewhere classified: Secondary | ICD-10-CM | POA: Diagnosis not present

## 2013-11-26 DIAGNOSIS — I9589 Other hypotension: Secondary | ICD-10-CM | POA: Diagnosis not present

## 2013-11-26 DIAGNOSIS — D631 Anemia in chronic kidney disease: Secondary | ICD-10-CM | POA: Diagnosis not present

## 2013-11-26 DIAGNOSIS — D509 Iron deficiency anemia, unspecified: Secondary | ICD-10-CM | POA: Diagnosis not present

## 2013-11-28 DIAGNOSIS — D509 Iron deficiency anemia, unspecified: Secondary | ICD-10-CM | POA: Diagnosis not present

## 2013-11-28 DIAGNOSIS — N186 End stage renal disease: Secondary | ICD-10-CM | POA: Diagnosis not present

## 2013-11-28 DIAGNOSIS — E878 Other disorders of electrolyte and fluid balance, not elsewhere classified: Secondary | ICD-10-CM | POA: Diagnosis not present

## 2013-11-28 DIAGNOSIS — D631 Anemia in chronic kidney disease: Secondary | ICD-10-CM | POA: Diagnosis not present

## 2013-11-28 DIAGNOSIS — I9589 Other hypotension: Secondary | ICD-10-CM | POA: Diagnosis not present

## 2013-12-01 DIAGNOSIS — I9589 Other hypotension: Secondary | ICD-10-CM | POA: Diagnosis not present

## 2013-12-01 DIAGNOSIS — E878 Other disorders of electrolyte and fluid balance, not elsewhere classified: Secondary | ICD-10-CM | POA: Diagnosis not present

## 2013-12-01 DIAGNOSIS — D509 Iron deficiency anemia, unspecified: Secondary | ICD-10-CM | POA: Diagnosis not present

## 2013-12-01 DIAGNOSIS — D631 Anemia in chronic kidney disease: Secondary | ICD-10-CM | POA: Diagnosis not present

## 2013-12-01 DIAGNOSIS — N186 End stage renal disease: Secondary | ICD-10-CM | POA: Diagnosis not present

## 2013-12-03 DIAGNOSIS — D509 Iron deficiency anemia, unspecified: Secondary | ICD-10-CM | POA: Diagnosis not present

## 2013-12-03 DIAGNOSIS — E878 Other disorders of electrolyte and fluid balance, not elsewhere classified: Secondary | ICD-10-CM | POA: Diagnosis not present

## 2013-12-03 DIAGNOSIS — N186 End stage renal disease: Secondary | ICD-10-CM | POA: Diagnosis not present

## 2013-12-03 DIAGNOSIS — I9589 Other hypotension: Secondary | ICD-10-CM | POA: Diagnosis not present

## 2013-12-03 DIAGNOSIS — D631 Anemia in chronic kidney disease: Secondary | ICD-10-CM | POA: Diagnosis not present

## 2013-12-06 DIAGNOSIS — I9589 Other hypotension: Secondary | ICD-10-CM | POA: Diagnosis not present

## 2013-12-06 DIAGNOSIS — D631 Anemia in chronic kidney disease: Secondary | ICD-10-CM | POA: Diagnosis not present

## 2013-12-06 DIAGNOSIS — D509 Iron deficiency anemia, unspecified: Secondary | ICD-10-CM | POA: Diagnosis not present

## 2013-12-06 DIAGNOSIS — N186 End stage renal disease: Secondary | ICD-10-CM | POA: Diagnosis not present

## 2013-12-06 DIAGNOSIS — E878 Other disorders of electrolyte and fluid balance, not elsewhere classified: Secondary | ICD-10-CM | POA: Diagnosis not present

## 2013-12-08 DIAGNOSIS — I9589 Other hypotension: Secondary | ICD-10-CM | POA: Diagnosis not present

## 2013-12-08 DIAGNOSIS — D509 Iron deficiency anemia, unspecified: Secondary | ICD-10-CM | POA: Diagnosis not present

## 2013-12-08 DIAGNOSIS — D631 Anemia in chronic kidney disease: Secondary | ICD-10-CM | POA: Diagnosis not present

## 2013-12-08 DIAGNOSIS — E878 Other disorders of electrolyte and fluid balance, not elsewhere classified: Secondary | ICD-10-CM | POA: Diagnosis not present

## 2013-12-08 DIAGNOSIS — N186 End stage renal disease: Secondary | ICD-10-CM | POA: Diagnosis not present

## 2013-12-10 DIAGNOSIS — D631 Anemia in chronic kidney disease: Secondary | ICD-10-CM | POA: Diagnosis not present

## 2013-12-10 DIAGNOSIS — I9589 Other hypotension: Secondary | ICD-10-CM | POA: Diagnosis not present

## 2013-12-10 DIAGNOSIS — N186 End stage renal disease: Secondary | ICD-10-CM | POA: Diagnosis not present

## 2013-12-10 DIAGNOSIS — E878 Other disorders of electrolyte and fluid balance, not elsewhere classified: Secondary | ICD-10-CM | POA: Diagnosis not present

## 2013-12-10 DIAGNOSIS — D509 Iron deficiency anemia, unspecified: Secondary | ICD-10-CM | POA: Diagnosis not present

## 2013-12-11 DIAGNOSIS — I9589 Other hypotension: Secondary | ICD-10-CM | POA: Diagnosis not present

## 2013-12-11 DIAGNOSIS — D631 Anemia in chronic kidney disease: Secondary | ICD-10-CM | POA: Diagnosis not present

## 2013-12-11 DIAGNOSIS — E878 Other disorders of electrolyte and fluid balance, not elsewhere classified: Secondary | ICD-10-CM | POA: Diagnosis not present

## 2013-12-11 DIAGNOSIS — D509 Iron deficiency anemia, unspecified: Secondary | ICD-10-CM | POA: Diagnosis not present

## 2013-12-11 DIAGNOSIS — Z992 Dependence on renal dialysis: Secondary | ICD-10-CM | POA: Diagnosis not present

## 2013-12-11 DIAGNOSIS — N186 End stage renal disease: Secondary | ICD-10-CM | POA: Diagnosis not present

## 2013-12-12 DIAGNOSIS — D631 Anemia in chronic kidney disease: Secondary | ICD-10-CM | POA: Diagnosis not present

## 2013-12-12 DIAGNOSIS — N186 End stage renal disease: Secondary | ICD-10-CM | POA: Diagnosis not present

## 2013-12-12 DIAGNOSIS — D509 Iron deficiency anemia, unspecified: Secondary | ICD-10-CM | POA: Diagnosis not present

## 2013-12-12 DIAGNOSIS — E878 Other disorders of electrolyte and fluid balance, not elsewhere classified: Secondary | ICD-10-CM | POA: Diagnosis not present

## 2013-12-12 DIAGNOSIS — I9589 Other hypotension: Secondary | ICD-10-CM | POA: Diagnosis not present

## 2013-12-12 HISTORY — PX: COLONOSCOPY: SHX174

## 2013-12-21 ENCOUNTER — Encounter (HOSPITAL_COMMUNITY): Payer: Self-pay | Admitting: Vascular Surgery

## 2014-01-11 DIAGNOSIS — N186 End stage renal disease: Secondary | ICD-10-CM | POA: Diagnosis not present

## 2014-01-11 DIAGNOSIS — Z992 Dependence on renal dialysis: Secondary | ICD-10-CM | POA: Diagnosis not present

## 2014-01-13 DIAGNOSIS — I9589 Other hypotension: Secondary | ICD-10-CM | POA: Diagnosis not present

## 2014-01-13 DIAGNOSIS — D631 Anemia in chronic kidney disease: Secondary | ICD-10-CM | POA: Diagnosis not present

## 2014-01-13 DIAGNOSIS — E878 Other disorders of electrolyte and fluid balance, not elsewhere classified: Secondary | ICD-10-CM | POA: Diagnosis not present

## 2014-01-13 DIAGNOSIS — N186 End stage renal disease: Secondary | ICD-10-CM | POA: Diagnosis not present

## 2014-01-15 DIAGNOSIS — D631 Anemia in chronic kidney disease: Secondary | ICD-10-CM | POA: Diagnosis not present

## 2014-01-15 DIAGNOSIS — E878 Other disorders of electrolyte and fluid balance, not elsewhere classified: Secondary | ICD-10-CM | POA: Diagnosis not present

## 2014-01-15 DIAGNOSIS — N186 End stage renal disease: Secondary | ICD-10-CM | POA: Diagnosis not present

## 2014-01-15 DIAGNOSIS — I9589 Other hypotension: Secondary | ICD-10-CM | POA: Diagnosis not present

## 2014-01-16 DIAGNOSIS — D631 Anemia in chronic kidney disease: Secondary | ICD-10-CM | POA: Diagnosis not present

## 2014-01-16 DIAGNOSIS — E878 Other disorders of electrolyte and fluid balance, not elsewhere classified: Secondary | ICD-10-CM | POA: Diagnosis not present

## 2014-01-16 DIAGNOSIS — N186 End stage renal disease: Secondary | ICD-10-CM | POA: Diagnosis not present

## 2014-01-16 DIAGNOSIS — I9589 Other hypotension: Secondary | ICD-10-CM | POA: Diagnosis not present

## 2014-01-17 DIAGNOSIS — D631 Anemia in chronic kidney disease: Secondary | ICD-10-CM | POA: Diagnosis not present

## 2014-01-17 DIAGNOSIS — E878 Other disorders of electrolyte and fluid balance, not elsewhere classified: Secondary | ICD-10-CM | POA: Diagnosis not present

## 2014-01-17 DIAGNOSIS — I9589 Other hypotension: Secondary | ICD-10-CM | POA: Diagnosis not present

## 2014-01-17 DIAGNOSIS — N186 End stage renal disease: Secondary | ICD-10-CM | POA: Diagnosis not present

## 2014-01-19 DIAGNOSIS — D631 Anemia in chronic kidney disease: Secondary | ICD-10-CM | POA: Diagnosis not present

## 2014-01-19 DIAGNOSIS — N186 End stage renal disease: Secondary | ICD-10-CM | POA: Diagnosis not present

## 2014-01-19 DIAGNOSIS — I9589 Other hypotension: Secondary | ICD-10-CM | POA: Diagnosis not present

## 2014-01-19 DIAGNOSIS — E878 Other disorders of electrolyte and fluid balance, not elsewhere classified: Secondary | ICD-10-CM | POA: Diagnosis not present

## 2014-01-21 DIAGNOSIS — D631 Anemia in chronic kidney disease: Secondary | ICD-10-CM | POA: Diagnosis not present

## 2014-01-21 DIAGNOSIS — I9589 Other hypotension: Secondary | ICD-10-CM | POA: Diagnosis not present

## 2014-01-21 DIAGNOSIS — E878 Other disorders of electrolyte and fluid balance, not elsewhere classified: Secondary | ICD-10-CM | POA: Diagnosis not present

## 2014-01-21 DIAGNOSIS — N186 End stage renal disease: Secondary | ICD-10-CM | POA: Diagnosis not present

## 2014-01-22 DIAGNOSIS — D631 Anemia in chronic kidney disease: Secondary | ICD-10-CM | POA: Diagnosis not present

## 2014-01-22 DIAGNOSIS — E878 Other disorders of electrolyte and fluid balance, not elsewhere classified: Secondary | ICD-10-CM | POA: Diagnosis not present

## 2014-01-22 DIAGNOSIS — N186 End stage renal disease: Secondary | ICD-10-CM | POA: Diagnosis not present

## 2014-01-22 DIAGNOSIS — I9589 Other hypotension: Secondary | ICD-10-CM | POA: Diagnosis not present

## 2014-01-23 DIAGNOSIS — N186 End stage renal disease: Secondary | ICD-10-CM | POA: Diagnosis not present

## 2014-01-23 DIAGNOSIS — I9589 Other hypotension: Secondary | ICD-10-CM | POA: Diagnosis not present

## 2014-01-23 DIAGNOSIS — D631 Anemia in chronic kidney disease: Secondary | ICD-10-CM | POA: Diagnosis not present

## 2014-01-23 DIAGNOSIS — E878 Other disorders of electrolyte and fluid balance, not elsewhere classified: Secondary | ICD-10-CM | POA: Diagnosis not present

## 2014-01-25 DIAGNOSIS — D631 Anemia in chronic kidney disease: Secondary | ICD-10-CM | POA: Diagnosis not present

## 2014-01-25 DIAGNOSIS — E878 Other disorders of electrolyte and fluid balance, not elsewhere classified: Secondary | ICD-10-CM | POA: Diagnosis not present

## 2014-01-25 DIAGNOSIS — N186 End stage renal disease: Secondary | ICD-10-CM | POA: Diagnosis not present

## 2014-01-25 DIAGNOSIS — I9589 Other hypotension: Secondary | ICD-10-CM | POA: Diagnosis not present

## 2014-01-27 DIAGNOSIS — D631 Anemia in chronic kidney disease: Secondary | ICD-10-CM | POA: Diagnosis not present

## 2014-01-27 DIAGNOSIS — I9589 Other hypotension: Secondary | ICD-10-CM | POA: Diagnosis not present

## 2014-01-27 DIAGNOSIS — N186 End stage renal disease: Secondary | ICD-10-CM | POA: Diagnosis not present

## 2014-01-27 DIAGNOSIS — E878 Other disorders of electrolyte and fluid balance, not elsewhere classified: Secondary | ICD-10-CM | POA: Diagnosis not present

## 2014-01-29 DIAGNOSIS — D631 Anemia in chronic kidney disease: Secondary | ICD-10-CM | POA: Diagnosis not present

## 2014-01-29 DIAGNOSIS — N186 End stage renal disease: Secondary | ICD-10-CM | POA: Diagnosis not present

## 2014-01-29 DIAGNOSIS — I9589 Other hypotension: Secondary | ICD-10-CM | POA: Diagnosis not present

## 2014-01-29 DIAGNOSIS — E878 Other disorders of electrolyte and fluid balance, not elsewhere classified: Secondary | ICD-10-CM | POA: Diagnosis not present

## 2014-01-31 DIAGNOSIS — I9589 Other hypotension: Secondary | ICD-10-CM | POA: Diagnosis not present

## 2014-01-31 DIAGNOSIS — N186 End stage renal disease: Secondary | ICD-10-CM | POA: Diagnosis not present

## 2014-01-31 DIAGNOSIS — E878 Other disorders of electrolyte and fluid balance, not elsewhere classified: Secondary | ICD-10-CM | POA: Diagnosis not present

## 2014-01-31 DIAGNOSIS — D631 Anemia in chronic kidney disease: Secondary | ICD-10-CM | POA: Diagnosis not present

## 2014-02-02 DIAGNOSIS — D631 Anemia in chronic kidney disease: Secondary | ICD-10-CM | POA: Diagnosis not present

## 2014-02-02 DIAGNOSIS — E878 Other disorders of electrolyte and fluid balance, not elsewhere classified: Secondary | ICD-10-CM | POA: Diagnosis not present

## 2014-02-02 DIAGNOSIS — N186 End stage renal disease: Secondary | ICD-10-CM | POA: Diagnosis not present

## 2014-02-02 DIAGNOSIS — I9589 Other hypotension: Secondary | ICD-10-CM | POA: Diagnosis not present

## 2014-02-04 ENCOUNTER — Other Ambulatory Visit: Payer: Self-pay | Admitting: Cardiology

## 2014-02-04 DIAGNOSIS — I9589 Other hypotension: Secondary | ICD-10-CM | POA: Diagnosis not present

## 2014-02-04 DIAGNOSIS — N186 End stage renal disease: Secondary | ICD-10-CM | POA: Diagnosis not present

## 2014-02-04 DIAGNOSIS — D631 Anemia in chronic kidney disease: Secondary | ICD-10-CM | POA: Diagnosis not present

## 2014-02-04 DIAGNOSIS — E878 Other disorders of electrolyte and fluid balance, not elsewhere classified: Secondary | ICD-10-CM | POA: Diagnosis not present

## 2014-02-05 DIAGNOSIS — E878 Other disorders of electrolyte and fluid balance, not elsewhere classified: Secondary | ICD-10-CM | POA: Diagnosis not present

## 2014-02-05 DIAGNOSIS — N186 End stage renal disease: Secondary | ICD-10-CM | POA: Diagnosis not present

## 2014-02-05 DIAGNOSIS — I9589 Other hypotension: Secondary | ICD-10-CM | POA: Diagnosis not present

## 2014-02-05 DIAGNOSIS — D631 Anemia in chronic kidney disease: Secondary | ICD-10-CM | POA: Diagnosis not present

## 2014-02-06 DIAGNOSIS — D631 Anemia in chronic kidney disease: Secondary | ICD-10-CM | POA: Diagnosis not present

## 2014-02-06 DIAGNOSIS — N186 End stage renal disease: Secondary | ICD-10-CM | POA: Diagnosis not present

## 2014-02-06 DIAGNOSIS — E878 Other disorders of electrolyte and fluid balance, not elsewhere classified: Secondary | ICD-10-CM | POA: Diagnosis not present

## 2014-02-06 DIAGNOSIS — I9589 Other hypotension: Secondary | ICD-10-CM | POA: Diagnosis not present

## 2014-02-08 DIAGNOSIS — D631 Anemia in chronic kidney disease: Secondary | ICD-10-CM | POA: Diagnosis not present

## 2014-02-08 DIAGNOSIS — E878 Other disorders of electrolyte and fluid balance, not elsewhere classified: Secondary | ICD-10-CM | POA: Diagnosis not present

## 2014-02-08 DIAGNOSIS — N186 End stage renal disease: Secondary | ICD-10-CM | POA: Diagnosis not present

## 2014-02-08 DIAGNOSIS — I9589 Other hypotension: Secondary | ICD-10-CM | POA: Diagnosis not present

## 2014-02-09 DIAGNOSIS — N186 End stage renal disease: Secondary | ICD-10-CM | POA: Diagnosis not present

## 2014-02-09 DIAGNOSIS — E878 Other disorders of electrolyte and fluid balance, not elsewhere classified: Secondary | ICD-10-CM | POA: Diagnosis not present

## 2014-02-09 DIAGNOSIS — I9589 Other hypotension: Secondary | ICD-10-CM | POA: Diagnosis not present

## 2014-02-09 DIAGNOSIS — D631 Anemia in chronic kidney disease: Secondary | ICD-10-CM | POA: Diagnosis not present

## 2014-02-10 DIAGNOSIS — E878 Other disorders of electrolyte and fluid balance, not elsewhere classified: Secondary | ICD-10-CM | POA: Diagnosis not present

## 2014-02-10 DIAGNOSIS — I9589 Other hypotension: Secondary | ICD-10-CM | POA: Diagnosis not present

## 2014-02-10 DIAGNOSIS — N186 End stage renal disease: Secondary | ICD-10-CM | POA: Diagnosis not present

## 2014-02-10 DIAGNOSIS — D631 Anemia in chronic kidney disease: Secondary | ICD-10-CM | POA: Diagnosis not present

## 2014-02-11 DIAGNOSIS — Z992 Dependence on renal dialysis: Secondary | ICD-10-CM | POA: Diagnosis not present

## 2014-02-11 DIAGNOSIS — N186 End stage renal disease: Secondary | ICD-10-CM | POA: Diagnosis not present

## 2014-02-12 DIAGNOSIS — E878 Other disorders of electrolyte and fluid balance, not elsewhere classified: Secondary | ICD-10-CM | POA: Diagnosis not present

## 2014-02-12 DIAGNOSIS — N2581 Secondary hyperparathyroidism of renal origin: Secondary | ICD-10-CM | POA: Diagnosis not present

## 2014-02-12 DIAGNOSIS — I9589 Other hypotension: Secondary | ICD-10-CM | POA: Diagnosis not present

## 2014-02-12 DIAGNOSIS — D631 Anemia in chronic kidney disease: Secondary | ICD-10-CM | POA: Diagnosis not present

## 2014-02-12 DIAGNOSIS — N186 End stage renal disease: Secondary | ICD-10-CM | POA: Diagnosis not present

## 2014-02-14 DIAGNOSIS — N2581 Secondary hyperparathyroidism of renal origin: Secondary | ICD-10-CM | POA: Diagnosis not present

## 2014-02-14 DIAGNOSIS — I9589 Other hypotension: Secondary | ICD-10-CM | POA: Diagnosis not present

## 2014-02-14 DIAGNOSIS — E878 Other disorders of electrolyte and fluid balance, not elsewhere classified: Secondary | ICD-10-CM | POA: Diagnosis not present

## 2014-02-14 DIAGNOSIS — D631 Anemia in chronic kidney disease: Secondary | ICD-10-CM | POA: Diagnosis not present

## 2014-02-14 DIAGNOSIS — N186 End stage renal disease: Secondary | ICD-10-CM | POA: Diagnosis not present

## 2014-02-17 DIAGNOSIS — N186 End stage renal disease: Secondary | ICD-10-CM | POA: Diagnosis not present

## 2014-02-17 DIAGNOSIS — N2581 Secondary hyperparathyroidism of renal origin: Secondary | ICD-10-CM | POA: Diagnosis not present

## 2014-02-17 DIAGNOSIS — E878 Other disorders of electrolyte and fluid balance, not elsewhere classified: Secondary | ICD-10-CM | POA: Diagnosis not present

## 2014-02-17 DIAGNOSIS — D631 Anemia in chronic kidney disease: Secondary | ICD-10-CM | POA: Diagnosis not present

## 2014-02-17 DIAGNOSIS — I9589 Other hypotension: Secondary | ICD-10-CM | POA: Diagnosis not present

## 2014-02-18 DIAGNOSIS — I9589 Other hypotension: Secondary | ICD-10-CM | POA: Diagnosis not present

## 2014-02-18 DIAGNOSIS — D631 Anemia in chronic kidney disease: Secondary | ICD-10-CM | POA: Diagnosis not present

## 2014-02-18 DIAGNOSIS — N2581 Secondary hyperparathyroidism of renal origin: Secondary | ICD-10-CM | POA: Diagnosis not present

## 2014-02-18 DIAGNOSIS — N186 End stage renal disease: Secondary | ICD-10-CM | POA: Diagnosis not present

## 2014-02-18 DIAGNOSIS — E878 Other disorders of electrolyte and fluid balance, not elsewhere classified: Secondary | ICD-10-CM | POA: Diagnosis not present

## 2014-02-19 DIAGNOSIS — E878 Other disorders of electrolyte and fluid balance, not elsewhere classified: Secondary | ICD-10-CM | POA: Diagnosis not present

## 2014-02-19 DIAGNOSIS — N2581 Secondary hyperparathyroidism of renal origin: Secondary | ICD-10-CM | POA: Diagnosis not present

## 2014-02-19 DIAGNOSIS — D631 Anemia in chronic kidney disease: Secondary | ICD-10-CM | POA: Diagnosis not present

## 2014-02-19 DIAGNOSIS — N186 End stage renal disease: Secondary | ICD-10-CM | POA: Diagnosis not present

## 2014-02-19 DIAGNOSIS — I9589 Other hypotension: Secondary | ICD-10-CM | POA: Diagnosis not present

## 2014-02-21 DIAGNOSIS — N186 End stage renal disease: Secondary | ICD-10-CM | POA: Diagnosis not present

## 2014-02-21 DIAGNOSIS — I9589 Other hypotension: Secondary | ICD-10-CM | POA: Diagnosis not present

## 2014-02-21 DIAGNOSIS — D631 Anemia in chronic kidney disease: Secondary | ICD-10-CM | POA: Diagnosis not present

## 2014-02-21 DIAGNOSIS — N2581 Secondary hyperparathyroidism of renal origin: Secondary | ICD-10-CM | POA: Diagnosis not present

## 2014-02-21 DIAGNOSIS — E878 Other disorders of electrolyte and fluid balance, not elsewhere classified: Secondary | ICD-10-CM | POA: Diagnosis not present

## 2014-02-22 DIAGNOSIS — E878 Other disorders of electrolyte and fluid balance, not elsewhere classified: Secondary | ICD-10-CM | POA: Diagnosis not present

## 2014-02-22 DIAGNOSIS — N2581 Secondary hyperparathyroidism of renal origin: Secondary | ICD-10-CM | POA: Diagnosis not present

## 2014-02-22 DIAGNOSIS — N186 End stage renal disease: Secondary | ICD-10-CM | POA: Diagnosis not present

## 2014-02-22 DIAGNOSIS — D631 Anemia in chronic kidney disease: Secondary | ICD-10-CM | POA: Diagnosis not present

## 2014-02-22 DIAGNOSIS — I9589 Other hypotension: Secondary | ICD-10-CM | POA: Diagnosis not present

## 2014-02-23 DIAGNOSIS — D631 Anemia in chronic kidney disease: Secondary | ICD-10-CM | POA: Diagnosis not present

## 2014-02-23 DIAGNOSIS — N2581 Secondary hyperparathyroidism of renal origin: Secondary | ICD-10-CM | POA: Diagnosis not present

## 2014-02-23 DIAGNOSIS — I9589 Other hypotension: Secondary | ICD-10-CM | POA: Diagnosis not present

## 2014-02-23 DIAGNOSIS — E878 Other disorders of electrolyte and fluid balance, not elsewhere classified: Secondary | ICD-10-CM | POA: Diagnosis not present

## 2014-02-23 DIAGNOSIS — N186 End stage renal disease: Secondary | ICD-10-CM | POA: Diagnosis not present

## 2014-02-27 DIAGNOSIS — N2581 Secondary hyperparathyroidism of renal origin: Secondary | ICD-10-CM | POA: Diagnosis not present

## 2014-02-27 DIAGNOSIS — D631 Anemia in chronic kidney disease: Secondary | ICD-10-CM | POA: Diagnosis not present

## 2014-02-27 DIAGNOSIS — I9589 Other hypotension: Secondary | ICD-10-CM | POA: Diagnosis not present

## 2014-02-27 DIAGNOSIS — N186 End stage renal disease: Secondary | ICD-10-CM | POA: Diagnosis not present

## 2014-02-27 DIAGNOSIS — E878 Other disorders of electrolyte and fluid balance, not elsewhere classified: Secondary | ICD-10-CM | POA: Diagnosis not present

## 2014-02-28 DIAGNOSIS — E878 Other disorders of electrolyte and fluid balance, not elsewhere classified: Secondary | ICD-10-CM | POA: Diagnosis not present

## 2014-02-28 DIAGNOSIS — I9589 Other hypotension: Secondary | ICD-10-CM | POA: Diagnosis not present

## 2014-02-28 DIAGNOSIS — D631 Anemia in chronic kidney disease: Secondary | ICD-10-CM | POA: Diagnosis not present

## 2014-02-28 DIAGNOSIS — N186 End stage renal disease: Secondary | ICD-10-CM | POA: Diagnosis not present

## 2014-02-28 DIAGNOSIS — N2581 Secondary hyperparathyroidism of renal origin: Secondary | ICD-10-CM | POA: Diagnosis not present

## 2014-03-02 DIAGNOSIS — N2581 Secondary hyperparathyroidism of renal origin: Secondary | ICD-10-CM | POA: Diagnosis not present

## 2014-03-02 DIAGNOSIS — E878 Other disorders of electrolyte and fluid balance, not elsewhere classified: Secondary | ICD-10-CM | POA: Diagnosis not present

## 2014-03-02 DIAGNOSIS — I9589 Other hypotension: Secondary | ICD-10-CM | POA: Diagnosis not present

## 2014-03-02 DIAGNOSIS — N186 End stage renal disease: Secondary | ICD-10-CM | POA: Diagnosis not present

## 2014-03-02 DIAGNOSIS — D631 Anemia in chronic kidney disease: Secondary | ICD-10-CM | POA: Diagnosis not present

## 2014-03-04 DIAGNOSIS — E878 Other disorders of electrolyte and fluid balance, not elsewhere classified: Secondary | ICD-10-CM | POA: Diagnosis not present

## 2014-03-04 DIAGNOSIS — N2581 Secondary hyperparathyroidism of renal origin: Secondary | ICD-10-CM | POA: Diagnosis not present

## 2014-03-04 DIAGNOSIS — D631 Anemia in chronic kidney disease: Secondary | ICD-10-CM | POA: Diagnosis not present

## 2014-03-04 DIAGNOSIS — N186 End stage renal disease: Secondary | ICD-10-CM | POA: Diagnosis not present

## 2014-03-04 DIAGNOSIS — I9589 Other hypotension: Secondary | ICD-10-CM | POA: Diagnosis not present

## 2014-03-05 DIAGNOSIS — E878 Other disorders of electrolyte and fluid balance, not elsewhere classified: Secondary | ICD-10-CM | POA: Diagnosis not present

## 2014-03-05 DIAGNOSIS — I9589 Other hypotension: Secondary | ICD-10-CM | POA: Diagnosis not present

## 2014-03-05 DIAGNOSIS — N186 End stage renal disease: Secondary | ICD-10-CM | POA: Diagnosis not present

## 2014-03-05 DIAGNOSIS — N2581 Secondary hyperparathyroidism of renal origin: Secondary | ICD-10-CM | POA: Diagnosis not present

## 2014-03-05 DIAGNOSIS — D631 Anemia in chronic kidney disease: Secondary | ICD-10-CM | POA: Diagnosis not present

## 2014-03-06 DIAGNOSIS — N2581 Secondary hyperparathyroidism of renal origin: Secondary | ICD-10-CM | POA: Diagnosis not present

## 2014-03-06 DIAGNOSIS — E878 Other disorders of electrolyte and fluid balance, not elsewhere classified: Secondary | ICD-10-CM | POA: Diagnosis not present

## 2014-03-06 DIAGNOSIS — D631 Anemia in chronic kidney disease: Secondary | ICD-10-CM | POA: Diagnosis not present

## 2014-03-06 DIAGNOSIS — I9589 Other hypotension: Secondary | ICD-10-CM | POA: Diagnosis not present

## 2014-03-06 DIAGNOSIS — N186 End stage renal disease: Secondary | ICD-10-CM | POA: Diagnosis not present

## 2014-03-08 DIAGNOSIS — D631 Anemia in chronic kidney disease: Secondary | ICD-10-CM | POA: Diagnosis not present

## 2014-03-08 DIAGNOSIS — N2581 Secondary hyperparathyroidism of renal origin: Secondary | ICD-10-CM | POA: Diagnosis not present

## 2014-03-08 DIAGNOSIS — I9589 Other hypotension: Secondary | ICD-10-CM | POA: Diagnosis not present

## 2014-03-08 DIAGNOSIS — N186 End stage renal disease: Secondary | ICD-10-CM | POA: Diagnosis not present

## 2014-03-08 DIAGNOSIS — E878 Other disorders of electrolyte and fluid balance, not elsewhere classified: Secondary | ICD-10-CM | POA: Diagnosis not present

## 2014-03-11 DIAGNOSIS — N186 End stage renal disease: Secondary | ICD-10-CM | POA: Diagnosis not present

## 2014-03-11 DIAGNOSIS — E878 Other disorders of electrolyte and fluid balance, not elsewhere classified: Secondary | ICD-10-CM | POA: Diagnosis not present

## 2014-03-11 DIAGNOSIS — D631 Anemia in chronic kidney disease: Secondary | ICD-10-CM | POA: Diagnosis not present

## 2014-03-11 DIAGNOSIS — I9589 Other hypotension: Secondary | ICD-10-CM | POA: Diagnosis not present

## 2014-03-11 DIAGNOSIS — N2581 Secondary hyperparathyroidism of renal origin: Secondary | ICD-10-CM | POA: Diagnosis not present

## 2014-03-12 DIAGNOSIS — D631 Anemia in chronic kidney disease: Secondary | ICD-10-CM | POA: Diagnosis not present

## 2014-03-12 DIAGNOSIS — N186 End stage renal disease: Secondary | ICD-10-CM | POA: Diagnosis not present

## 2014-03-12 DIAGNOSIS — N2581 Secondary hyperparathyroidism of renal origin: Secondary | ICD-10-CM | POA: Diagnosis not present

## 2014-03-12 DIAGNOSIS — E878 Other disorders of electrolyte and fluid balance, not elsewhere classified: Secondary | ICD-10-CM | POA: Diagnosis not present

## 2014-03-12 DIAGNOSIS — I9589 Other hypotension: Secondary | ICD-10-CM | POA: Diagnosis not present

## 2014-03-12 DIAGNOSIS — Z992 Dependence on renal dialysis: Secondary | ICD-10-CM | POA: Diagnosis not present

## 2014-03-13 DIAGNOSIS — N2581 Secondary hyperparathyroidism of renal origin: Secondary | ICD-10-CM | POA: Diagnosis not present

## 2014-03-13 DIAGNOSIS — K7689 Other specified diseases of liver: Secondary | ICD-10-CM | POA: Diagnosis not present

## 2014-03-13 DIAGNOSIS — E878 Other disorders of electrolyte and fluid balance, not elsewhere classified: Secondary | ICD-10-CM | POA: Diagnosis not present

## 2014-03-13 DIAGNOSIS — N186 End stage renal disease: Secondary | ICD-10-CM | POA: Diagnosis not present

## 2014-03-13 DIAGNOSIS — I9589 Other hypotension: Secondary | ICD-10-CM | POA: Diagnosis not present

## 2014-03-13 DIAGNOSIS — D631 Anemia in chronic kidney disease: Secondary | ICD-10-CM | POA: Diagnosis not present

## 2014-03-15 DIAGNOSIS — K7689 Other specified diseases of liver: Secondary | ICD-10-CM | POA: Diagnosis not present

## 2014-03-15 DIAGNOSIS — I9589 Other hypotension: Secondary | ICD-10-CM | POA: Diagnosis not present

## 2014-03-15 DIAGNOSIS — E878 Other disorders of electrolyte and fluid balance, not elsewhere classified: Secondary | ICD-10-CM | POA: Diagnosis not present

## 2014-03-15 DIAGNOSIS — N186 End stage renal disease: Secondary | ICD-10-CM | POA: Diagnosis not present

## 2014-03-15 DIAGNOSIS — D631 Anemia in chronic kidney disease: Secondary | ICD-10-CM | POA: Diagnosis not present

## 2014-03-15 DIAGNOSIS — N2581 Secondary hyperparathyroidism of renal origin: Secondary | ICD-10-CM | POA: Diagnosis not present

## 2014-03-17 DIAGNOSIS — K7689 Other specified diseases of liver: Secondary | ICD-10-CM | POA: Diagnosis not present

## 2014-03-17 DIAGNOSIS — I9589 Other hypotension: Secondary | ICD-10-CM | POA: Diagnosis not present

## 2014-03-17 DIAGNOSIS — D631 Anemia in chronic kidney disease: Secondary | ICD-10-CM | POA: Diagnosis not present

## 2014-03-17 DIAGNOSIS — N186 End stage renal disease: Secondary | ICD-10-CM | POA: Diagnosis not present

## 2014-03-17 DIAGNOSIS — E878 Other disorders of electrolyte and fluid balance, not elsewhere classified: Secondary | ICD-10-CM | POA: Diagnosis not present

## 2014-03-17 DIAGNOSIS — N2581 Secondary hyperparathyroidism of renal origin: Secondary | ICD-10-CM | POA: Diagnosis not present

## 2014-03-19 DIAGNOSIS — N186 End stage renal disease: Secondary | ICD-10-CM | POA: Diagnosis not present

## 2014-03-19 DIAGNOSIS — I9589 Other hypotension: Secondary | ICD-10-CM | POA: Diagnosis not present

## 2014-03-19 DIAGNOSIS — K7689 Other specified diseases of liver: Secondary | ICD-10-CM | POA: Diagnosis not present

## 2014-03-19 DIAGNOSIS — D631 Anemia in chronic kidney disease: Secondary | ICD-10-CM | POA: Diagnosis not present

## 2014-03-19 DIAGNOSIS — E878 Other disorders of electrolyte and fluid balance, not elsewhere classified: Secondary | ICD-10-CM | POA: Diagnosis not present

## 2014-03-19 DIAGNOSIS — N2581 Secondary hyperparathyroidism of renal origin: Secondary | ICD-10-CM | POA: Diagnosis not present

## 2014-03-21 DIAGNOSIS — N186 End stage renal disease: Secondary | ICD-10-CM | POA: Diagnosis not present

## 2014-03-21 DIAGNOSIS — D631 Anemia in chronic kidney disease: Secondary | ICD-10-CM | POA: Diagnosis not present

## 2014-03-21 DIAGNOSIS — N2581 Secondary hyperparathyroidism of renal origin: Secondary | ICD-10-CM | POA: Diagnosis not present

## 2014-03-21 DIAGNOSIS — I9589 Other hypotension: Secondary | ICD-10-CM | POA: Diagnosis not present

## 2014-03-21 DIAGNOSIS — E878 Other disorders of electrolyte and fluid balance, not elsewhere classified: Secondary | ICD-10-CM | POA: Diagnosis not present

## 2014-03-21 DIAGNOSIS — K7689 Other specified diseases of liver: Secondary | ICD-10-CM | POA: Diagnosis not present

## 2014-03-23 DIAGNOSIS — E878 Other disorders of electrolyte and fluid balance, not elsewhere classified: Secondary | ICD-10-CM | POA: Diagnosis not present

## 2014-03-23 DIAGNOSIS — K7689 Other specified diseases of liver: Secondary | ICD-10-CM | POA: Diagnosis not present

## 2014-03-23 DIAGNOSIS — N2581 Secondary hyperparathyroidism of renal origin: Secondary | ICD-10-CM | POA: Diagnosis not present

## 2014-03-23 DIAGNOSIS — N186 End stage renal disease: Secondary | ICD-10-CM | POA: Diagnosis not present

## 2014-03-23 DIAGNOSIS — I9589 Other hypotension: Secondary | ICD-10-CM | POA: Diagnosis not present

## 2014-03-23 DIAGNOSIS — D631 Anemia in chronic kidney disease: Secondary | ICD-10-CM | POA: Diagnosis not present

## 2014-03-25 DIAGNOSIS — D631 Anemia in chronic kidney disease: Secondary | ICD-10-CM | POA: Diagnosis not present

## 2014-03-25 DIAGNOSIS — N186 End stage renal disease: Secondary | ICD-10-CM | POA: Diagnosis not present

## 2014-03-25 DIAGNOSIS — E878 Other disorders of electrolyte and fluid balance, not elsewhere classified: Secondary | ICD-10-CM | POA: Diagnosis not present

## 2014-03-25 DIAGNOSIS — N2581 Secondary hyperparathyroidism of renal origin: Secondary | ICD-10-CM | POA: Diagnosis not present

## 2014-03-25 DIAGNOSIS — K7689 Other specified diseases of liver: Secondary | ICD-10-CM | POA: Diagnosis not present

## 2014-03-25 DIAGNOSIS — I9589 Other hypotension: Secondary | ICD-10-CM | POA: Diagnosis not present

## 2014-03-28 DIAGNOSIS — E878 Other disorders of electrolyte and fluid balance, not elsewhere classified: Secondary | ICD-10-CM | POA: Diagnosis not present

## 2014-03-28 DIAGNOSIS — K7689 Other specified diseases of liver: Secondary | ICD-10-CM | POA: Diagnosis not present

## 2014-03-28 DIAGNOSIS — I9589 Other hypotension: Secondary | ICD-10-CM | POA: Diagnosis not present

## 2014-03-28 DIAGNOSIS — D631 Anemia in chronic kidney disease: Secondary | ICD-10-CM | POA: Diagnosis not present

## 2014-03-28 DIAGNOSIS — N2581 Secondary hyperparathyroidism of renal origin: Secondary | ICD-10-CM | POA: Diagnosis not present

## 2014-03-28 DIAGNOSIS — N186 End stage renal disease: Secondary | ICD-10-CM | POA: Diagnosis not present

## 2014-03-30 DIAGNOSIS — K7689 Other specified diseases of liver: Secondary | ICD-10-CM | POA: Diagnosis not present

## 2014-03-30 DIAGNOSIS — N186 End stage renal disease: Secondary | ICD-10-CM | POA: Diagnosis not present

## 2014-03-30 DIAGNOSIS — I9589 Other hypotension: Secondary | ICD-10-CM | POA: Diagnosis not present

## 2014-03-30 DIAGNOSIS — D631 Anemia in chronic kidney disease: Secondary | ICD-10-CM | POA: Diagnosis not present

## 2014-03-30 DIAGNOSIS — E878 Other disorders of electrolyte and fluid balance, not elsewhere classified: Secondary | ICD-10-CM | POA: Diagnosis not present

## 2014-03-30 DIAGNOSIS — N2581 Secondary hyperparathyroidism of renal origin: Secondary | ICD-10-CM | POA: Diagnosis not present

## 2014-04-01 DIAGNOSIS — N2581 Secondary hyperparathyroidism of renal origin: Secondary | ICD-10-CM | POA: Diagnosis not present

## 2014-04-01 DIAGNOSIS — K7689 Other specified diseases of liver: Secondary | ICD-10-CM | POA: Diagnosis not present

## 2014-04-01 DIAGNOSIS — D631 Anemia in chronic kidney disease: Secondary | ICD-10-CM | POA: Diagnosis not present

## 2014-04-01 DIAGNOSIS — E878 Other disorders of electrolyte and fluid balance, not elsewhere classified: Secondary | ICD-10-CM | POA: Diagnosis not present

## 2014-04-01 DIAGNOSIS — I9589 Other hypotension: Secondary | ICD-10-CM | POA: Diagnosis not present

## 2014-04-01 DIAGNOSIS — N186 End stage renal disease: Secondary | ICD-10-CM | POA: Diagnosis not present

## 2014-04-03 DIAGNOSIS — I9589 Other hypotension: Secondary | ICD-10-CM | POA: Diagnosis not present

## 2014-04-03 DIAGNOSIS — E878 Other disorders of electrolyte and fluid balance, not elsewhere classified: Secondary | ICD-10-CM | POA: Diagnosis not present

## 2014-04-03 DIAGNOSIS — K7689 Other specified diseases of liver: Secondary | ICD-10-CM | POA: Diagnosis not present

## 2014-04-03 DIAGNOSIS — N186 End stage renal disease: Secondary | ICD-10-CM | POA: Diagnosis not present

## 2014-04-03 DIAGNOSIS — D631 Anemia in chronic kidney disease: Secondary | ICD-10-CM | POA: Diagnosis not present

## 2014-04-03 DIAGNOSIS — N2581 Secondary hyperparathyroidism of renal origin: Secondary | ICD-10-CM | POA: Diagnosis not present

## 2014-04-04 DIAGNOSIS — I9589 Other hypotension: Secondary | ICD-10-CM | POA: Diagnosis not present

## 2014-04-04 DIAGNOSIS — K7689 Other specified diseases of liver: Secondary | ICD-10-CM | POA: Diagnosis not present

## 2014-04-04 DIAGNOSIS — N2581 Secondary hyperparathyroidism of renal origin: Secondary | ICD-10-CM | POA: Diagnosis not present

## 2014-04-04 DIAGNOSIS — N186 End stage renal disease: Secondary | ICD-10-CM | POA: Diagnosis not present

## 2014-04-04 DIAGNOSIS — D631 Anemia in chronic kidney disease: Secondary | ICD-10-CM | POA: Diagnosis not present

## 2014-04-04 DIAGNOSIS — E878 Other disorders of electrolyte and fluid balance, not elsewhere classified: Secondary | ICD-10-CM | POA: Diagnosis not present

## 2014-04-06 DIAGNOSIS — E878 Other disorders of electrolyte and fluid balance, not elsewhere classified: Secondary | ICD-10-CM | POA: Diagnosis not present

## 2014-04-06 DIAGNOSIS — D631 Anemia in chronic kidney disease: Secondary | ICD-10-CM | POA: Diagnosis not present

## 2014-04-06 DIAGNOSIS — K7689 Other specified diseases of liver: Secondary | ICD-10-CM | POA: Diagnosis not present

## 2014-04-06 DIAGNOSIS — N2581 Secondary hyperparathyroidism of renal origin: Secondary | ICD-10-CM | POA: Diagnosis not present

## 2014-04-06 DIAGNOSIS — I9589 Other hypotension: Secondary | ICD-10-CM | POA: Diagnosis not present

## 2014-04-06 DIAGNOSIS — N186 End stage renal disease: Secondary | ICD-10-CM | POA: Diagnosis not present

## 2014-04-07 DIAGNOSIS — D631 Anemia in chronic kidney disease: Secondary | ICD-10-CM | POA: Diagnosis not present

## 2014-04-07 DIAGNOSIS — E878 Other disorders of electrolyte and fluid balance, not elsewhere classified: Secondary | ICD-10-CM | POA: Diagnosis not present

## 2014-04-07 DIAGNOSIS — N2581 Secondary hyperparathyroidism of renal origin: Secondary | ICD-10-CM | POA: Diagnosis not present

## 2014-04-07 DIAGNOSIS — I9589 Other hypotension: Secondary | ICD-10-CM | POA: Diagnosis not present

## 2014-04-07 DIAGNOSIS — N186 End stage renal disease: Secondary | ICD-10-CM | POA: Diagnosis not present

## 2014-04-07 DIAGNOSIS — K7689 Other specified diseases of liver: Secondary | ICD-10-CM | POA: Diagnosis not present

## 2014-04-09 DIAGNOSIS — N186 End stage renal disease: Secondary | ICD-10-CM | POA: Diagnosis not present

## 2014-04-09 DIAGNOSIS — K7689 Other specified diseases of liver: Secondary | ICD-10-CM | POA: Diagnosis not present

## 2014-04-09 DIAGNOSIS — N2581 Secondary hyperparathyroidism of renal origin: Secondary | ICD-10-CM | POA: Diagnosis not present

## 2014-04-09 DIAGNOSIS — I9589 Other hypotension: Secondary | ICD-10-CM | POA: Diagnosis not present

## 2014-04-09 DIAGNOSIS — E878 Other disorders of electrolyte and fluid balance, not elsewhere classified: Secondary | ICD-10-CM | POA: Diagnosis not present

## 2014-04-09 DIAGNOSIS — D631 Anemia in chronic kidney disease: Secondary | ICD-10-CM | POA: Diagnosis not present

## 2014-04-11 ENCOUNTER — Telehealth: Payer: Self-pay | Admitting: Cardiology

## 2014-04-11 DIAGNOSIS — N2581 Secondary hyperparathyroidism of renal origin: Secondary | ICD-10-CM | POA: Diagnosis not present

## 2014-04-11 DIAGNOSIS — I9589 Other hypotension: Secondary | ICD-10-CM | POA: Diagnosis not present

## 2014-04-11 DIAGNOSIS — E878 Other disorders of electrolyte and fluid balance, not elsewhere classified: Secondary | ICD-10-CM | POA: Diagnosis not present

## 2014-04-11 DIAGNOSIS — D631 Anemia in chronic kidney disease: Secondary | ICD-10-CM | POA: Diagnosis not present

## 2014-04-11 DIAGNOSIS — N186 End stage renal disease: Secondary | ICD-10-CM | POA: Diagnosis not present

## 2014-04-11 DIAGNOSIS — K7689 Other specified diseases of liver: Secondary | ICD-10-CM | POA: Diagnosis not present

## 2014-04-11 NOTE — Telephone Encounter (Signed)
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 He stated that he will need this by the the beginning of the month. Please f/u for the pt  Last seen by Dr. Martinique on 06/19/13 was suppose to f/u for a 6 month in December but did not coming in or no recall was inputted.  Thanks

## 2014-04-11 NOTE — Telephone Encounter (Signed)
Received records from [facility] for appointment with Dr Martinique on 04/12/14.  Records given to [facility] (medical recors) for Dr Doug Sou schedule on 04/12/14.  lp

## 2014-04-11 NOTE — Telephone Encounter (Signed)
Returned call to patient he stated he needed appointment with Dr.Jordan.Stated he wants to go back to work.Appointment scheduled with Dr.Jordan 04/12/14 at 8:15 am.

## 2014-04-12 ENCOUNTER — Ambulatory Visit (INDEPENDENT_AMBULATORY_CARE_PROVIDER_SITE_OTHER): Payer: Medicare Other | Admitting: Cardiology

## 2014-04-12 ENCOUNTER — Encounter: Payer: Self-pay | Admitting: Cardiology

## 2014-04-12 VITALS — BP 138/62 | HR 71 | Ht 72.0 in | Wt 265.1 lb

## 2014-04-12 DIAGNOSIS — N186 End stage renal disease: Secondary | ICD-10-CM

## 2014-04-12 DIAGNOSIS — I25119 Atherosclerotic heart disease of native coronary artery with unspecified angina pectoris: Secondary | ICD-10-CM | POA: Diagnosis not present

## 2014-04-12 DIAGNOSIS — E785 Hyperlipidemia, unspecified: Secondary | ICD-10-CM

## 2014-04-12 DIAGNOSIS — Z992 Dependence on renal dialysis: Secondary | ICD-10-CM
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
## 2014-04-12 NOTE — Patient Instructions (Addendum)
Continue your current therapy  After April 20 you can stop Plavix  We will get a copy of your lab work.  You need to focus on eating a healthy diet with no fried foods and low sodium and lose weight  I will see you in 6 months

## 2014-04-13 DIAGNOSIS — D631 Anemia in chronic kidney disease: Secondary | ICD-10-CM | POA: Diagnosis not present

## 2014-04-13 DIAGNOSIS — N186 End stage renal disease: Secondary | ICD-10-CM | POA: Diagnosis not present

## 2014-04-13 DIAGNOSIS — Z79899 Other long term (current) drug therapy: Secondary | ICD-10-CM | POA: Diagnosis not present

## 2014-04-14 DIAGNOSIS — D631 Anemia in chronic kidney disease: Secondary | ICD-10-CM | POA: Diagnosis not present

## 2014-04-14 DIAGNOSIS — N186 End stage renal disease: Secondary | ICD-10-CM | POA: Diagnosis not present

## 2014-04-14 DIAGNOSIS — Z79899 Other long term (current) drug therapy: Secondary | ICD-10-CM | POA: Diagnosis not present

## 2014-04-16 DIAGNOSIS — N186 End stage renal disease: Secondary | ICD-10-CM | POA: Diagnosis not present

## 2014-04-16 DIAGNOSIS — D631 Anemia in chronic kidney disease: Secondary | ICD-10-CM | POA: Diagnosis not present

## 2014-04-16 DIAGNOSIS — Z79899 Other long term (current) drug therapy: Secondary | ICD-10-CM | POA: Diagnosis not present

## 2014-04-18 DIAGNOSIS — Z79899 Other long term (current) drug therapy: Secondary | ICD-10-CM | POA: Diagnosis not present

## 2014-04-18 DIAGNOSIS — D631 Anemia in chronic kidney disease: Secondary | ICD-10-CM | POA: Diagnosis not present

## 2014-04-18 DIAGNOSIS — N186 End stage renal disease: Secondary | ICD-10-CM | POA: Diagnosis not present

## 2014-04-19 DIAGNOSIS — Z79899 Other long term (current) drug therapy: Secondary | ICD-10-CM | POA: Diagnosis not present

## 2014-04-19 DIAGNOSIS — N186 End stage renal disease: Secondary | ICD-10-CM | POA: Diagnosis not present

## 2014-04-19 DIAGNOSIS — D631 Anemia in chronic kidney disease: Secondary | ICD-10-CM | POA: Diagnosis not present

## 2014-04-20 DIAGNOSIS — Z79899 Other long term (current) drug therapy: Secondary | ICD-10-CM | POA: Diagnosis not present

## 2014-04-20 DIAGNOSIS — D631 Anemia in chronic kidney disease: Secondary | ICD-10-CM | POA: Diagnosis not present

## 2014-04-20 DIAGNOSIS — N186 End stage renal disease: Secondary | ICD-10-CM | POA: Diagnosis not present

## 2014-04-21 DIAGNOSIS — Z79899 Other long term (current) drug therapy: Secondary | ICD-10-CM | POA: Diagnosis not present

## 2014-04-21 DIAGNOSIS — N186 End stage renal disease: Secondary | ICD-10-CM | POA: Diagnosis not present

## 2014-04-21 DIAGNOSIS — D631 Anemia in chronic kidney disease: Secondary | ICD-10-CM | POA: Diagnosis not present

## 2014-04-23 DIAGNOSIS — Z79899 Other long term (current) drug therapy: Secondary | ICD-10-CM | POA: Diagnosis not present

## 2014-04-23 DIAGNOSIS — D631 Anemia in chronic kidney disease: Secondary | ICD-10-CM | POA: Diagnosis not present

## 2014-04-23 DIAGNOSIS — N186 End stage renal disease: Secondary | ICD-10-CM | POA: Diagnosis not present

## 2014-04-24 DIAGNOSIS — N186 End stage renal disease: Secondary | ICD-10-CM | POA: Diagnosis not present

## 2014-04-24 DIAGNOSIS — Z79899 Other long term (current) drug therapy: Secondary | ICD-10-CM | POA: Diagnosis not present

## 2014-04-24 DIAGNOSIS — D631 Anemia in chronic kidney disease: Secondary | ICD-10-CM | POA: Diagnosis not present

## 2014-04-25 DIAGNOSIS — D631 Anemia in chronic kidney disease: Secondary | ICD-10-CM | POA: Diagnosis not present

## 2014-04-25 DIAGNOSIS — N186 End stage renal disease: Secondary | ICD-10-CM | POA: Diagnosis not present

## 2014-04-25 DIAGNOSIS — Z79899 Other long term (current) drug therapy: Secondary | ICD-10-CM | POA: Diagnosis not present

## 2014-04-27 DIAGNOSIS — Z79899 Other long term (current) drug therapy: Secondary | ICD-10-CM | POA: Diagnosis not present

## 2014-04-27 DIAGNOSIS — D631 Anemia in chronic kidney disease: Secondary | ICD-10-CM | POA: Diagnosis not present

## 2014-04-27 DIAGNOSIS — N186 End stage renal disease: Secondary | ICD-10-CM | POA: Diagnosis not present

## 2014-04-28 DIAGNOSIS — D631 Anemia in chronic kidney disease: Secondary | ICD-10-CM | POA: Diagnosis not present

## 2014-04-28 DIAGNOSIS — N186 End stage renal disease: Secondary | ICD-10-CM | POA: Diagnosis not present

## 2014-04-28 DIAGNOSIS — Z79899 Other long term (current) drug therapy: Secondary | ICD-10-CM | POA: Diagnosis not present

## 2014-04-30 DIAGNOSIS — D631 Anemia in chronic kidney disease: Secondary | ICD-10-CM | POA: Diagnosis not present

## 2014-04-30 DIAGNOSIS — N186 End stage renal disease: Secondary | ICD-10-CM | POA: Diagnosis not present

## 2014-04-30 DIAGNOSIS — Z79899 Other long term (current) drug therapy: Secondary | ICD-10-CM | POA: Diagnosis not present

## 2014-05-02 DIAGNOSIS — N186 End stage renal disease: Secondary | ICD-10-CM | POA: Diagnosis not present

## 2014-05-02 DIAGNOSIS — Z79899 Other long term (current) drug therapy: Secondary | ICD-10-CM | POA: Diagnosis not present

## 2014-05-02 DIAGNOSIS — D631 Anemia in chronic kidney disease: Secondary | ICD-10-CM | POA: Diagnosis not present

## 2014-05-04 DIAGNOSIS — N186 End stage renal disease: Secondary | ICD-10-CM | POA: Diagnosis not present

## 2014-05-04 DIAGNOSIS — Z79899 Other long term (current) drug therapy: Secondary | ICD-10-CM | POA: Diagnosis not present

## 2014-05-04 DIAGNOSIS — D631 Anemia in chronic kidney disease: Secondary | ICD-10-CM | POA: Diagnosis not present

## 2014-05-05 DIAGNOSIS — D631 Anemia in chronic kidney disease: Secondary | ICD-10-CM | POA: Diagnosis not present

## 2014-05-05 DIAGNOSIS — N186 End stage renal disease: Secondary | ICD-10-CM | POA: Diagnosis not present

## 2014-05-05 DIAGNOSIS — Z79899 Other long term (current) drug therapy: Secondary | ICD-10-CM | POA: Diagnosis not present

## 2014-05-06 DIAGNOSIS — D631 Anemia in chronic kidney disease: Secondary | ICD-10-CM | POA: Diagnosis not present

## 2014-05-06 DIAGNOSIS — Z79899 Other long term (current) drug therapy: Secondary | ICD-10-CM | POA: Diagnosis not present

## 2014-05-06 DIAGNOSIS — N186 End stage renal disease: Secondary | ICD-10-CM | POA: Diagnosis not present

## 2014-05-08 DIAGNOSIS — D631 Anemia in chronic kidney disease: Secondary | ICD-10-CM | POA: Diagnosis not present

## 2014-05-08 DIAGNOSIS — Z79899 Other long term (current) drug therapy: Secondary | ICD-10-CM | POA: Diagnosis not present

## 2014-05-08 DIAGNOSIS — N186 End stage renal disease: Secondary | ICD-10-CM | POA: Diagnosis not present

## 2014-05-10 DIAGNOSIS — N186 End stage renal disease: Secondary | ICD-10-CM | POA: Diagnosis not present

## 2014-05-10 DIAGNOSIS — D631 Anemia in chronic kidney disease: Secondary | ICD-10-CM | POA: Diagnosis not present

## 2014-05-10 DIAGNOSIS — Z79899 Other long term (current) drug therapy: Secondary | ICD-10-CM | POA: Diagnosis not present

## 2014-05-12 DIAGNOSIS — Z992 Dependence on renal dialysis: Secondary | ICD-10-CM | POA: Diagnosis not present

## 2014-05-12 DIAGNOSIS — N186 End stage renal disease: Secondary | ICD-10-CM | POA: Diagnosis not present

## 2014-05-13 DIAGNOSIS — Z79899 Other long term (current) drug therapy: Secondary | ICD-10-CM | POA: Diagnosis not present

## 2014-05-13 DIAGNOSIS — D631 Anemia in chronic kidney disease: Secondary | ICD-10-CM | POA: Diagnosis not present

## 2014-05-13 DIAGNOSIS — N186 End stage renal disease: Secondary | ICD-10-CM | POA: Diagnosis not present

## 2014-05-13 DIAGNOSIS — N2581 Secondary hyperparathyroidism of renal origin: Secondary | ICD-10-CM | POA: Diagnosis not present

## 2014-05-14 DIAGNOSIS — T82858D Stenosis of vascular prosthetic devices, implants and grafts, subsequent encounter: Secondary | ICD-10-CM | POA: Diagnosis not present

## 2014-05-14 DIAGNOSIS — N186 End stage renal disease: Secondary | ICD-10-CM | POA: Diagnosis not present

## 2014-05-14 DIAGNOSIS — I871 Compression of vein: Secondary | ICD-10-CM | POA: Diagnosis not present

## 2014-05-14 DIAGNOSIS — Z992 Dependence on renal dialysis: Secondary | ICD-10-CM | POA: Diagnosis not present

## 2014-05-15 DIAGNOSIS — N186 End stage renal disease: Secondary | ICD-10-CM | POA: Diagnosis not present

## 2014-05-15 DIAGNOSIS — N2581 Secondary hyperparathyroidism of renal origin: Secondary | ICD-10-CM | POA: Diagnosis not present

## 2014-05-15 DIAGNOSIS — Z79899 Other long term (current) drug therapy: Secondary | ICD-10-CM | POA: Diagnosis not present

## 2014-05-15 DIAGNOSIS — D631 Anemia in chronic kidney disease: Secondary | ICD-10-CM | POA: Diagnosis not present

## 2014-05-16 ENCOUNTER — Encounter: Payer: Self-pay | Admitting: Medical

## 2014-05-16 ENCOUNTER — Ambulatory Visit (INDEPENDENT_AMBULATORY_CARE_PROVIDER_SITE_OTHER): Payer: Self-pay | Admitting: Medical

## 2014-05-16 VITALS — BP 102/60 | HR 76 | Temp 98.0°F | Resp 15 | Ht 71.0 in | Wt 262.0 lb

## 2014-05-16 DIAGNOSIS — N2581 Secondary hyperparathyroidism of renal origin: Secondary | ICD-10-CM | POA: Diagnosis not present

## 2014-05-16 DIAGNOSIS — D631 Anemia in chronic kidney disease: Secondary | ICD-10-CM

## 2014-05-16 DIAGNOSIS — G4733 Obstructive sleep apnea (adult) (pediatric): Secondary | ICD-10-CM

## 2014-05-16 DIAGNOSIS — N189 Chronic kidney disease, unspecified: Secondary | ICD-10-CM

## 2014-05-16 DIAGNOSIS — Z008 Encounter for other general examination: Secondary | ICD-10-CM

## 2014-05-16 DIAGNOSIS — I2510 Atherosclerotic heart disease of native coronary artery without angina pectoris: Secondary | ICD-10-CM

## 2014-05-16 DIAGNOSIS — Z0289 Encounter for other administrative examinations: Secondary | ICD-10-CM

## 2014-05-16 DIAGNOSIS — N186 End stage renal disease: Secondary | ICD-10-CM

## 2014-05-16 DIAGNOSIS — Z992 Dependence on renal dialysis: Secondary | ICD-10-CM

## 2014-05-16 DIAGNOSIS — Z79899 Other long term (current) drug therapy: Secondary | ICD-10-CM | POA: Diagnosis not present

## 2014-05-16 DIAGNOSIS — E785 Hyperlipidemia, unspecified: Secondary | ICD-10-CM

## 2014-05-16 LAB — POCT URINALYSIS DIPSTICK
Glucose, UA: NEGATIVE
Ketones, UA: NEGATIVE
Nitrite, UA: NEGATIVE
Spec Grav, UA: >=1.03
Urobilinogen, UA: NEGATIVE
pH, UA: 6

## 2014-05-16 NOTE — Progress Notes (Addendum)
Games developer Medical Examination   [name] is a 64 y.o. male who presents today for a commercial driver fitness determination physical exam.  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    Medical care team includes:  Dorothea Ogle, PA-C here for primary care  Dr. Larey Seat, neurology  Dr. Peter Martinique, cardiology  Dr. Trinda Pascal, nephrology, [facility]  Dr. Janice Norrie, urology  The patient reports no problems, overall feeling pretty good.  Review of Systems A comprehensive review of systems was reviewed and noted as below:  Eye: - corrective lenses, -lasik surgery or other eye surgery, -glaucoma, -cataracts, -macular degeneration, -monocular vision, -medication for eye condition, -blurred vision,   Ears: -hearing problems, - hearing aids, -ear pain, -ear drainage, -ear fullness, -tinnitus, -recurrent ear infection, -previous ear surgery, - vertigo, -menieres disease  Endocrine: -polydipsia, -polyuria, -weight loss, -fainting, -dizziness, - altered or loss of consciousness, -hypoglycemia  Cardiovascular: +heart disease, -CHF, -heart attack, +cardiac stents, -bypass surgery, -other heart surgery, -hypertension, -blood clots, -pacemaker, +medications for heart condition, -chest pain, -SOB, -palpitations, -fainting, -dizziness, -dyspnea  Respiratory: -asthma, -COPD, other lung disease, -smoker, -chest tightness, - wheezing, -snoring, -daytime sleepiness, +sleep apnea but not using CPAP, -narcolepsy  Allergy: -uncontrollable sneezing or allergy symptoms  Musculoskeletal: -missing body parts, -muscle disease, -bone disease, -spine injury, -low back pain, -medication for joints, bones, muscles or pain, -physical limitations, -joint pain, -neck pain, -limitations of neck ROM, -back surgery, orthopedic surgery, -rheumatologic condition, -gout  Neurologic: -neurologic disease, -dementia, -seizures, -parkinsons, -tremor, -memory problems, -weakness, -numbness, -tingling, -medication  for neurologic condition, -medications for sleep condition  Gastric: -abdominal pain, -chronic diarrhea or IBS, -uncontrollable nausea  Kidney/Renal: -hematuria, +dialysis, +kidney disease, polycystic kidney disease  Psychiatric: -homicidal thoughts, -suicidal thoughts, -prior suicide attempts, -get into fights/hurting others, -memory or concentration problems, -delusions, -hallucinations, -hospitalizaiton for mental health problem, -depression, -anxiety, -bipolar  Drug use: - none  Reviewed their medical, surgical, family, social, medication, and allergy history and updated chart as appropriate.      Objective:   Physical Exam BP 102/60 mmHg  Pulse 76  Temp(Src) 98 F (36.7 C) (Oral)  Resp 15  Ht 5\' 11"  (1.803 m)  Wt 262 lb (118.842 kg)  BMI 36.56 kg/m2  General appearance: alert, no distress, WD/WN, obese AA male Skin:no worrisome findings, 2 long large rectangular surgical scars from skin graft of right upper thigh vertically from prior shunt surgeries HEENT: normocephalic, conjunctiva/corneas normal, sclerae anicteric, PERRLA, EOMi, nares patent, no discharge or erythema, pharynx normal Oral cavity: MMM, tongue normal, teeth in good repair Neck: supple, no lymphadenopathy, no thyromegaly, no masses, normal ROM, no bruits Chest: non tender, normal shape and expansion Heart: RRR, normal S1, S2, no murmurs Lungs: CTA bilaterally, no wheezes, rhonchi, or rales Abdomen: +bs, soft, long vertical surgical scar, right lateral mid abdomen surgical scar from prior colostomy, non tender, non distended, no masses, no hepatomegaly, no splenomegaly, no bruits Back: non tender, normal ROM, no scoliosis Musculoskeletal: right arm with shunt medial mid and upper arm for dialysis access, otherwise upper extremities non tender, no obvious deformity, normal ROM throughout, lower extremities non tender, no obvious deformity, normal ROM throughout Extremities: no edema, no cyanosis, no  clubbing Pulses: 2+ symmetric, upper and lower extremities, normal cap refill Neurological: alert, oriented x 3, CN2-12 intact, strength normal upper extremities and lower extremities, sensation normal throughout, DTRs 2+ throughout, no cerebellar signs, gait normal Psychiatric: normal affect, behavior normal, pleasant  GU: normal male external genitalia, nontender, no masses, no  hernia, no lymphadenopathy Rectal: deferred  Vision:  Uncorrected Corrected Horizontal Field of Vision  Right Eye 20/20 n/a 90 degrees  Left Eye  20/20 n/a 90 degrees  Both Eyes  03/00 n/a    Applicant can recognize and distinguish among traffic control signals and devices showing standard red, green, and amber colors.  Monocular Vision?: No   Hearing: Passed forced whisper bilat at 10'  Urinalysis abnormal but he is on dialysis  Assessment:   Encounter Diagnoses  Name Primary?  . Health examination of defined subpopulation Yes  . Coronary artery disease involving native coronary artery of native heart without angina pectoris   . CKD (chronic kidney disease) stage V requiring chronic dialysis   . Anemia in chronic kidney disease   . Dyslipidemia   . 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  See updated chart records above.  Reviewed recent neurology notes from Dr. Brett Fairy from June and later 2015, and he is likely due back with neurology.  He is noncompliant with CPAP but denies any symtpoms at the moment . I would contact Dr. Brett Fairy for advice, but this may complicate his clearance for DOT.  Reviewed nephrology notes and lab results from 03/2014.  He is doing home dialysis and has done this for years which helps his mobility and quality of life.     Anemia - due f/u with hematology, had recent drop in hemoglobin and needs f/u  Discussed his significant health issues, compliance, current  treatment, and current guidelines related to clearance for DOT.     F/u pending call back

## 2014-05-17 ENCOUNTER — Telehealth: Payer: Self-pay | Admitting: Medical

## 2014-05-17 ENCOUNTER — Encounter: Payer: Self-pay | Admitting: Medical

## 2014-05-17 DIAGNOSIS — Z79899 Other long term (current) drug therapy: Secondary | ICD-10-CM | POA: Diagnosis not present

## 2014-05-17 DIAGNOSIS — D631 Anemia in chronic kidney disease: Secondary | ICD-10-CM | POA: Diagnosis not present

## 2014-05-17 DIAGNOSIS — N2581 Secondary hyperparathyroidism of renal origin: Secondary | ICD-10-CM | POA: Diagnosis not present

## 2014-05-17 DIAGNOSIS — N186 End stage renal disease: Secondary | ICD-10-CM | POA: Diagnosis not present

## 2014-05-17 NOTE — Telephone Encounter (Signed)
pls  call nurse for Dr. Julianne Rice Voora/nephrology requesting clearance letter for DOT/CDL as it relates to his anemia and home dialysis/kidney function.  She is at [facility]

## 2014-05-20 DIAGNOSIS — D631 Anemia in chronic kidney disease: Secondary | ICD-10-CM | POA: Diagnosis not present

## 2014-05-20 DIAGNOSIS — N186 End stage renal disease: Secondary | ICD-10-CM | POA: Diagnosis not present

## 2014-05-20 DIAGNOSIS — N2581 Secondary hyperparathyroidism of renal origin: Secondary | ICD-10-CM | POA: Diagnosis not present

## 2014-05-20 DIAGNOSIS — Z79899 Other long term (current) drug therapy: Secondary | ICD-10-CM | POA: Diagnosis not present

## 2014-05-21 NOTE — Telephone Encounter (Signed)
What I'm I suppose to let this nurse know?

## 2014-05-21 NOTE — Telephone Encounter (Signed)
I want a letter from them saying that given his home dialysis and overall stability from a kidney and anemia perspective is he safe for CDL, need document from them stating this

## 2014-05-22 DIAGNOSIS — D631 Anemia in chronic kidney disease: Secondary | ICD-10-CM | POA: Diagnosis not present

## 2014-05-22 DIAGNOSIS — N2581 Secondary hyperparathyroidism of renal origin: Secondary | ICD-10-CM | POA: Diagnosis not present

## 2014-05-22 DIAGNOSIS — Z79899 Other long term (current) drug therapy: Secondary | ICD-10-CM | POA: Diagnosis not present

## 2014-05-22 DIAGNOSIS — N186 End stage renal disease: Secondary | ICD-10-CM | POA: Diagnosis not present

## 2014-05-24 DIAGNOSIS — D631 Anemia in chronic kidney disease: Secondary | ICD-10-CM | POA: Diagnosis not present

## 2014-05-24 DIAGNOSIS — N186 End stage renal disease: Secondary | ICD-10-CM | POA: Diagnosis not present

## 2014-05-24 DIAGNOSIS — Z79899 Other long term (current) drug therapy: Secondary | ICD-10-CM | POA: Diagnosis not present

## 2014-05-24 DIAGNOSIS — N2581 Secondary hyperparathyroidism of renal origin: Secondary | ICD-10-CM | POA: Diagnosis not present

## 2014-05-25 ENCOUNTER — Telehealth: Payer: Self-pay | Admitting: Medical

## 2014-05-25 NOTE — Telephone Encounter (Signed)
Pt called & states it's been over a week and he hasn't heard anything about his DOT.  His has expired and he's unable to work.  Advised pt we are waiting on clearance letter from Dr at [facility].

## 2014-05-25 NOTE — Telephone Encounter (Signed)
Here is the dilemma, I haven't heard yet back from specialist.  specifically the biggest issues is sleep apnea.  I can't approve him if he is not using CPAP with known documentation of sleep apnea.  So he can either have a neurology follow up to discuss sleep apnea or go for repeat sleep study if he doesn't think he has it.     Let me know how he wants to proceed regarding sleep apnea?  Otherwise, I am ALSO still waiting on nephrology and other specialist for documentation regarding kidney and anemia.

## 2014-05-27 DIAGNOSIS — D631 Anemia in chronic kidney disease: Secondary | ICD-10-CM | POA: Diagnosis not present

## 2014-05-27 DIAGNOSIS — Z79899 Other long term (current) drug therapy: Secondary | ICD-10-CM | POA: Diagnosis not present

## 2014-05-27 DIAGNOSIS — N2581 Secondary hyperparathyroidism of renal origin: Secondary | ICD-10-CM | POA: Diagnosis not present

## 2014-05-27 DIAGNOSIS — N186 End stage renal disease: Secondary | ICD-10-CM | POA: Diagnosis not present

## 2014-05-28 NOTE — Telephone Encounter (Signed)
I went over your message with the patient and he understood but wasn't happy about it. Patient states that he will contact Dr. Beacher May office about the sleep apnea. Patient is also aware that we are awaiting a call from Dr. Smith Mince office.

## 2014-05-28 NOTE — Telephone Encounter (Signed)
He has a complicated history and is not compliant with CPAP, so my hands are tied.  We will work as quickly as we can to get him cleared, but the CPAP issue is the big one that the state is very clear on denial for DOT if not using the CPAP.    Thus, either he needs to use it and we will need compliance report in 30 days, or he needs to go back and consult with neurology ASAP

## 2014-05-28 NOTE — Telephone Encounter (Signed)
Patient is aware that we are waiting on a return call.

## 2014-05-28 NOTE — Telephone Encounter (Signed)
I called and left a message with the nurse Pam for Dr. Trinda Pascal @ [phone] to call us back in reference to this patient.

## 2014-05-29 DIAGNOSIS — N186 End stage renal disease: Secondary | ICD-10-CM | POA: Diagnosis not present

## 2014-05-29 DIAGNOSIS — N2581 Secondary hyperparathyroidism of renal origin: Secondary | ICD-10-CM | POA: Diagnosis not present

## 2014-05-29 DIAGNOSIS — Z79899 Other long term (current) drug therapy: Secondary | ICD-10-CM | POA: Diagnosis not present

## 2014-05-29 DIAGNOSIS — D631 Anemia in chronic kidney disease: Secondary | ICD-10-CM | POA: Diagnosis not present

## 2014-05-29 NOTE — Telephone Encounter (Signed)
I called and I left a message for Dr. Smith Mince at number [phone] in reference to this patient. Waiting on a return phone call

## 2014-05-30 ENCOUNTER — Telehealth: Payer: Self-pay | Admitting: Medical

## 2014-05-30 NOTE — Telephone Encounter (Signed)
pls call patient.  I entered his certificate on the [employer] as "determination pending" with expiration of 06/27/14.   He will need to sign his wallet card and put the national registry number on there.  Here is the "to do" list: 1-it is his responsibility to make this happen.  If we don't have some notation or letter from the specialist by 06/27/14, then I will have to re-list his certificate as temporary disqualified from driving 2-he has to see neurology to make a decision on CPAP or start using CPAP.   technically if not using CPAP and if restarting CPAP, the rules say there is a 69mo waiting period where he can't be driving.   I don't make the rules, but I have to abide by them.  Thus, have him f/u with neurology or start CPAP and I'll need a compliance report printed from his home health company in 30 days. 3-I need a letter from his kidney doctor stating he is ok from their perspective to drive while doing home dialysis 4-his hemoglobin in March was 8.8 which is very low.  We need some resolution of this.   So either have him come in for separate f/u on anemia, or needs to have f/u with hematology or kidney doctor on this ASAP to not delay things.  Often when someone gets into the 8 hemoglobin range, transfusions are needed.  Have him f/u with [place], kidney or hematology doctor regarding low hemoglobin.  I'm just trying to speed the process up to help him stay certified.

## 2014-05-30 NOTE — Telephone Encounter (Signed)
I have called and left message with [employer]/DOT about regulations.  I spoke directly to his nephrologist who will get him back in to recheck on anemia including B12 folate level and stool cards.  She felt like he did better on another Epo preparation prior.  However, we need to rule out other sources of blood loss and anemia.   Nephrologist was under the impression that dialysis is a disqualification for DOT.   I will await [employer]'s call back

## 2014-05-30 NOTE — Telephone Encounter (Signed)
Dorothea Ogle PA has spoken with Dr. Smith Mince regarding this patient.

## 2014-05-30 NOTE — Telephone Encounter (Signed)
Patient is aware of Dorothea Ogle PA message and his recommendations and he understands I also made a copy of the message and patient will pick this up. Forms for DOT is up front for patient to pick and Patient is aware of this

## 2014-05-30 NOTE — Telephone Encounter (Signed)
Patient is aware of Dorothea Ogle PA message in full detail and he understood

## 2014-05-31 DIAGNOSIS — N2581 Secondary hyperparathyroidism of renal origin: Secondary | ICD-10-CM | POA: Diagnosis not present

## 2014-05-31 DIAGNOSIS — N186 End stage renal disease: Secondary | ICD-10-CM | POA: Diagnosis not present

## 2014-05-31 DIAGNOSIS — Z79899 Other long term (current) drug therapy: Secondary | ICD-10-CM | POA: Diagnosis not present

## 2014-05-31 DIAGNOSIS — D631 Anemia in chronic kidney disease: Secondary | ICD-10-CM | POA: Diagnosis not present

## 2014-06-02 DIAGNOSIS — Z79899 Other long term (current) drug therapy: Secondary | ICD-10-CM | POA: Diagnosis not present

## 2014-06-02 DIAGNOSIS — N186 End stage renal disease: Secondary | ICD-10-CM | POA: Diagnosis not present

## 2014-06-02 DIAGNOSIS — D631 Anemia in chronic kidney disease: Secondary | ICD-10-CM | POA: Diagnosis not present

## 2014-06-02 DIAGNOSIS — N2581 Secondary hyperparathyroidism of renal origin: Secondary | ICD-10-CM | POA: Diagnosis not present

## 2014-06-03 DIAGNOSIS — N2581 Secondary hyperparathyroidism of renal origin: Secondary | ICD-10-CM | POA: Diagnosis not present

## 2014-06-03 DIAGNOSIS — Z79899 Other long term (current) drug therapy: Secondary | ICD-10-CM | POA: Diagnosis not present

## 2014-06-03 DIAGNOSIS — D631 Anemia in chronic kidney disease: Secondary | ICD-10-CM | POA: Diagnosis not present

## 2014-06-03 DIAGNOSIS — N186 End stage renal disease: Secondary | ICD-10-CM | POA: Diagnosis not present

## 2014-06-04 ENCOUNTER — Telehealth: Payer: Self-pay | Admitting: Medical

## 2014-06-04 DIAGNOSIS — Z79899 Other long term (current) drug therapy: Secondary | ICD-10-CM | POA: Diagnosis not present

## 2014-06-04 DIAGNOSIS — D631 Anemia in chronic kidney disease: Secondary | ICD-10-CM | POA: Diagnosis not present

## 2014-06-04 DIAGNOSIS — N2581 Secondary hyperparathyroidism of renal origin: Secondary | ICD-10-CM | POA: Diagnosis not present

## 2014-06-04 DIAGNOSIS — N186 End stage renal disease: Secondary | ICD-10-CM | POA: Diagnosis not present

## 2014-06-04 NOTE — Telephone Encounter (Signed)
I have spoken to the [facility], the board that controls DOT certification, and I have also talked with his nephrologist.    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   After speaking with his kidney doctor and the [facility], we will have to have more strict control of his BP, strict compliance with treament plans, and more frequent f/u.  Thus, he needs to make a f/u with nephrology ASAP to discuss his overall care in regards to anemia, dialysis, blood pressure, and avoiding any incapacitating event.  Have him f/u with me for routine f/u and med check (not self pay but as an insured visit), and start CPAP right away.  I still can't approve him on certification until he has been compliant with CPAP x 30 days, and will need CPAP compliance report.

## 2014-06-04 NOTE — Telephone Encounter (Signed)
pls get his kidney doctor or her nurse back on the phone so I can inform of recent news

## 2014-06-04 NOTE — Telephone Encounter (Signed)
I left a message for Dr. Trinda Pascal @ [phone] to return the phone to [facility] PA-C

## 2014-06-05 DIAGNOSIS — N186 End stage renal disease: Secondary | ICD-10-CM | POA: Diagnosis not present

## 2014-06-05 DIAGNOSIS — D631 Anemia in chronic kidney disease: Secondary | ICD-10-CM | POA: Diagnosis not present

## 2014-06-05 DIAGNOSIS — Z79899 Other long term (current) drug therapy: Secondary | ICD-10-CM | POA: Diagnosis not present

## 2014-06-05 DIAGNOSIS — N2581 Secondary hyperparathyroidism of renal origin: Secondary | ICD-10-CM | POA: Diagnosis not present

## 2014-06-05 NOTE — Telephone Encounter (Signed)
LMOM TO CB. CLS 

## 2014-06-05 NOTE — Telephone Encounter (Signed)
Dorothea Ogle PA spoke with Dr. Smith Mince

## 2014-06-06 NOTE — Telephone Encounter (Signed)
I spoke with the patient in detail about John Ogle PA message and I went over it in full details and he understands. Patient states that he saw the nephrology on Monday 06/04/14. Patient states that he tried the CPAP machine before in the past and it didn't work for him. He states he couldn't sleep. 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 I told him to call us back to let us know what his plans are or what he has decided moving forward.

## 2014-06-07 DIAGNOSIS — N186 End stage renal disease: Secondary | ICD-10-CM | POA: Diagnosis not present

## 2014-06-07 DIAGNOSIS — D631 Anemia in chronic kidney disease: Secondary | ICD-10-CM | POA: Diagnosis not present

## 2014-06-07 DIAGNOSIS — N2581 Secondary hyperparathyroidism of renal origin: Secondary | ICD-10-CM | POA: Diagnosis not present

## 2014-06-07 DIAGNOSIS — Z79899 Other long term (current) drug therapy: Secondary | ICD-10-CM | POA: Diagnosis not present

## 2014-06-08 DIAGNOSIS — N2581 Secondary hyperparathyroidism of renal origin: Secondary | ICD-10-CM | POA: Diagnosis not present

## 2014-06-08 DIAGNOSIS — Z79899 Other long term (current) drug therapy: Secondary | ICD-10-CM | POA: Diagnosis not present

## 2014-06-08 DIAGNOSIS — D631 Anemia in chronic kidney disease: Secondary | ICD-10-CM | POA: Diagnosis not present

## 2014-06-08 DIAGNOSIS — N186 End stage renal disease: Secondary | ICD-10-CM | POA: Diagnosis not present

## 2014-06-10 DIAGNOSIS — N186 End stage renal disease: Secondary | ICD-10-CM | POA: Diagnosis not present

## 2014-06-10 DIAGNOSIS — Z79899 Other long term (current) drug therapy: Secondary | ICD-10-CM | POA: Diagnosis not present

## 2014-06-10 DIAGNOSIS — N2581 Secondary hyperparathyroidism of renal origin: Secondary | ICD-10-CM | POA: Diagnosis not present

## 2014-06-10 DIAGNOSIS — D631 Anemia in chronic kidney disease: Secondary | ICD-10-CM | POA: Diagnosis not present

## 2014-06-12 DIAGNOSIS — Z992 Dependence on renal dialysis: Secondary | ICD-10-CM | POA: Diagnosis not present

## 2014-06-12 DIAGNOSIS — Z79899 Other long term (current) drug therapy: Secondary | ICD-10-CM | POA: Diagnosis not present

## 2014-06-12 DIAGNOSIS — D631 Anemia in chronic kidney disease: Secondary | ICD-10-CM | POA: Diagnosis not present

## 2014-06-12 DIAGNOSIS — N186 End stage renal disease: Secondary | ICD-10-CM | POA: Diagnosis not present

## 2014-06-12 DIAGNOSIS — N2581 Secondary hyperparathyroidism of renal origin: Secondary | ICD-10-CM | POA: Diagnosis not present

## 2014-06-13 ENCOUNTER — Telehealth: Payer: Self-pay

## 2014-06-13 DIAGNOSIS — D631 Anemia in chronic kidney disease: Secondary | ICD-10-CM | POA: Diagnosis not present

## 2014-06-13 DIAGNOSIS — N186 End stage renal disease: Secondary | ICD-10-CM | POA: Diagnosis not present

## 2014-06-13 DIAGNOSIS — K7689 Other specified diseases of liver: Secondary | ICD-10-CM | POA: Diagnosis not present

## 2014-06-13 DIAGNOSIS — Z4931 Encounter for adequacy testing for hemodialysis: Secondary | ICD-10-CM | POA: Diagnosis not present

## 2014-06-13 NOTE — Telephone Encounter (Signed)
Called pt per Dr. Brett Fairy request for a follow up with his cpap machine. Pt stated that he did not want a follow up appointment but gave no reason why. Encouraged pt to call and make an appt if he changes his mind, or with questions or concerns. Pt verbalized understanding.

## 2014-06-14 DIAGNOSIS — Z4931 Encounter for adequacy testing for hemodialysis: Secondary | ICD-10-CM | POA: Diagnosis not present

## 2014-06-14 DIAGNOSIS — K7689 Other specified diseases of liver: Secondary | ICD-10-CM | POA: Diagnosis not present

## 2014-06-14 DIAGNOSIS — D631 Anemia in chronic kidney disease: Secondary | ICD-10-CM | POA: Diagnosis not present

## 2014-06-14 DIAGNOSIS — N186 End stage renal disease: Secondary | ICD-10-CM | POA: Diagnosis not present

## 2014-06-15 NOTE — Telephone Encounter (Signed)
Patient called and requested to speak with Dr. Brett Fairy regarding the follow up request with his cpap machine. Please call and advise.

## 2014-06-16 DIAGNOSIS — N186 End stage renal disease: Secondary | ICD-10-CM | POA: Diagnosis not present

## 2014-06-16 DIAGNOSIS — D631 Anemia in chronic kidney disease: Secondary | ICD-10-CM | POA: Diagnosis not present

## 2014-06-16 DIAGNOSIS — Z4931 Encounter for adequacy testing for hemodialysis: Secondary | ICD-10-CM | POA: Diagnosis not present

## 2014-06-16 DIAGNOSIS — K7689 Other specified diseases of liver: Secondary | ICD-10-CM | POA: Diagnosis not present

## 2014-06-18 ENCOUNTER — Telehealth: Payer: Self-pay

## 2014-06-18 DIAGNOSIS — Z4931 Encounter for adequacy testing for hemodialysis: Secondary | ICD-10-CM | POA: Diagnosis not present

## 2014-06-18 DIAGNOSIS — D631 Anemia in chronic kidney disease: Secondary | ICD-10-CM | POA: Diagnosis not present

## 2014-06-18 DIAGNOSIS — K7689 Other specified diseases of liver: Secondary | ICD-10-CM | POA: Diagnosis not present

## 2014-06-18 DIAGNOSIS — N186 End stage renal disease: Secondary | ICD-10-CM | POA: Diagnosis not present

## 2014-06-18 NOTE — Telephone Encounter (Signed)
Returned pt's phone call. 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 Brett Fairy to discuss not needing the cpap any longer. Made an appt for pt on 7/11 at 1:30. Asked pt to arrive 15 minutes early and bring his cpap.

## 2014-06-18 NOTE — Telephone Encounter (Signed)
error 

## 2014-06-20 DIAGNOSIS — D631 Anemia in chronic kidney disease: Secondary | ICD-10-CM | POA: Diagnosis not present

## 2014-06-20 DIAGNOSIS — K7689 Other specified diseases of liver: Secondary | ICD-10-CM | POA: Diagnosis not present

## 2014-06-20 DIAGNOSIS — Z4931 Encounter for adequacy testing for hemodialysis: Secondary | ICD-10-CM | POA: Diagnosis not present

## 2014-06-20 DIAGNOSIS — N186 End stage renal disease: Secondary | ICD-10-CM | POA: Diagnosis not present

## 2014-06-22 DIAGNOSIS — D631 Anemia in chronic kidney disease: Secondary | ICD-10-CM | POA: Diagnosis not present

## 2014-06-22 DIAGNOSIS — N186 End stage renal disease: Secondary | ICD-10-CM | POA: Diagnosis not present

## 2014-06-22 DIAGNOSIS — Z4931 Encounter for adequacy testing for hemodialysis: Secondary | ICD-10-CM | POA: Diagnosis not present

## 2014-06-22 DIAGNOSIS — K7689 Other specified diseases of liver: Secondary | ICD-10-CM | POA: Diagnosis not present

## 2014-06-24 DIAGNOSIS — K7689 Other specified diseases of liver: Secondary | ICD-10-CM | POA: Diagnosis not present

## 2014-06-24 DIAGNOSIS — N186 End stage renal disease: Secondary | ICD-10-CM | POA: Diagnosis not present

## 2014-06-24 DIAGNOSIS — Z4931 Encounter for adequacy testing for hemodialysis: Secondary | ICD-10-CM | POA: Diagnosis not present

## 2014-06-24 DIAGNOSIS — D631 Anemia in chronic kidney disease: Secondary | ICD-10-CM | POA: Diagnosis not present

## 2014-06-25 DIAGNOSIS — N186 End stage renal disease: Secondary | ICD-10-CM | POA: Diagnosis not present

## 2014-06-25 DIAGNOSIS — R972 Elevated prostate specific antigen [PSA]: Secondary | ICD-10-CM | POA: Diagnosis not present

## 2014-06-25 DIAGNOSIS — Z4931 Encounter for adequacy testing for hemodialysis: Secondary | ICD-10-CM | POA: Diagnosis not present

## 2014-06-25 DIAGNOSIS — K7689 Other specified diseases of liver: Secondary | ICD-10-CM | POA: Diagnosis not present

## 2014-06-25 DIAGNOSIS — D631 Anemia in chronic kidney disease: Secondary | ICD-10-CM | POA: Diagnosis not present

## 2014-06-27 DIAGNOSIS — K7689 Other specified diseases of liver: Secondary | ICD-10-CM | POA: Diagnosis not present

## 2014-06-27 DIAGNOSIS — Z4931 Encounter for adequacy testing for hemodialysis: Secondary | ICD-10-CM | POA: Diagnosis not present

## 2014-06-27 DIAGNOSIS — D631 Anemia in chronic kidney disease: Secondary | ICD-10-CM | POA: Diagnosis not present

## 2014-06-27 DIAGNOSIS — N186 End stage renal disease: Secondary | ICD-10-CM | POA: Diagnosis not present

## 2014-06-29 DIAGNOSIS — Z4931 Encounter for adequacy testing for hemodialysis: Secondary | ICD-10-CM | POA: Diagnosis not present

## 2014-06-29 DIAGNOSIS — D631 Anemia in chronic kidney disease: Secondary | ICD-10-CM | POA: Diagnosis not present

## 2014-06-29 DIAGNOSIS — N186 End stage renal disease: Secondary | ICD-10-CM | POA: Diagnosis not present

## 2014-06-29 DIAGNOSIS — K7689 Other specified diseases of liver: Secondary | ICD-10-CM | POA: Diagnosis not present

## 2014-07-01 DIAGNOSIS — N186 End stage renal disease: Secondary | ICD-10-CM | POA: Diagnosis not present

## 2014-07-01 DIAGNOSIS — Z4931 Encounter for adequacy testing for hemodialysis: Secondary | ICD-10-CM | POA: Diagnosis not present

## 2014-07-01 DIAGNOSIS — K7689 Other specified diseases of liver: Secondary | ICD-10-CM | POA: Diagnosis not present

## 2014-07-01 DIAGNOSIS — D631 Anemia in chronic kidney disease: Secondary | ICD-10-CM | POA: Diagnosis not present

## 2014-07-02 DIAGNOSIS — N401 Enlarged prostate with lower urinary tract symptoms: Secondary | ICD-10-CM | POA: Diagnosis not present

## 2014-07-02 DIAGNOSIS — R3912 Poor urinary stream: Secondary | ICD-10-CM | POA: Diagnosis not present

## 2014-07-02 DIAGNOSIS — R3915 Urgency of urination: Secondary | ICD-10-CM | POA: Diagnosis not present

## 2014-07-02 DIAGNOSIS — R972 Elevated prostate specific antigen [PSA]: Secondary | ICD-10-CM | POA: Diagnosis not present

## 2014-07-03 DIAGNOSIS — K7689 Other specified diseases of liver: Secondary | ICD-10-CM | POA: Diagnosis not present

## 2014-07-03 DIAGNOSIS — D631 Anemia in chronic kidney disease: Secondary | ICD-10-CM | POA: Diagnosis not present

## 2014-07-03 DIAGNOSIS — Z4931 Encounter for adequacy testing for hemodialysis: Secondary | ICD-10-CM | POA: Diagnosis not present

## 2014-07-03 DIAGNOSIS — N186 End stage renal disease: Secondary | ICD-10-CM | POA: Diagnosis not present

## 2014-07-05 DIAGNOSIS — K7689 Other specified diseases of liver: Secondary | ICD-10-CM | POA: Diagnosis not present

## 2014-07-05 DIAGNOSIS — Z4931 Encounter for adequacy testing for hemodialysis: Secondary | ICD-10-CM | POA: Diagnosis not present

## 2014-07-05 DIAGNOSIS — N186 End stage renal disease: Secondary | ICD-10-CM | POA: Diagnosis not present

## 2014-07-05 DIAGNOSIS — D631 Anemia in chronic kidney disease: Secondary | ICD-10-CM | POA: Diagnosis not present

## 2014-07-06 DIAGNOSIS — K7689 Other specified diseases of liver: Secondary | ICD-10-CM | POA: Diagnosis not present

## 2014-07-06 DIAGNOSIS — E784 Other hyperlipidemia: Secondary | ICD-10-CM | POA: Diagnosis not present

## 2014-07-06 DIAGNOSIS — Z4931 Encounter for adequacy testing for hemodialysis: Secondary | ICD-10-CM | POA: Diagnosis not present

## 2014-07-06 DIAGNOSIS — D631 Anemia in chronic kidney disease: Secondary | ICD-10-CM | POA: Diagnosis not present

## 2014-07-06 DIAGNOSIS — N186 End stage renal disease: Secondary | ICD-10-CM | POA: Diagnosis not present

## 2014-07-07 DIAGNOSIS — K7689 Other specified diseases of liver: Secondary | ICD-10-CM | POA: Diagnosis not present

## 2014-07-07 DIAGNOSIS — N186 End stage renal disease: Secondary | ICD-10-CM | POA: Diagnosis not present

## 2014-07-07 DIAGNOSIS — D631 Anemia in chronic kidney disease: Secondary | ICD-10-CM | POA: Diagnosis not present

## 2014-07-07 DIAGNOSIS — Z4931 Encounter for adequacy testing for hemodialysis: Secondary | ICD-10-CM | POA: Diagnosis not present

## 2014-07-09 DIAGNOSIS — K7689 Other specified diseases of liver: Secondary | ICD-10-CM | POA: Diagnosis not present

## 2014-07-09 DIAGNOSIS — Z4931 Encounter for adequacy testing for hemodialysis: Secondary | ICD-10-CM | POA: Diagnosis not present

## 2014-07-09 DIAGNOSIS — N186 End stage renal disease: Secondary | ICD-10-CM | POA: Diagnosis not present

## 2014-07-09 DIAGNOSIS — D631 Anemia in chronic kidney disease: Secondary | ICD-10-CM | POA: Diagnosis not present

## 2014-07-10 DIAGNOSIS — N186 End stage renal disease: Secondary | ICD-10-CM | POA: Diagnosis not present

## 2014-07-10 DIAGNOSIS — K7689 Other specified diseases of liver: Secondary | ICD-10-CM | POA: Diagnosis not present

## 2014-07-10 DIAGNOSIS — D631 Anemia in chronic kidney disease: Secondary | ICD-10-CM | POA: Diagnosis not present

## 2014-07-10 DIAGNOSIS — Z4931 Encounter for adequacy testing for hemodialysis: Secondary | ICD-10-CM | POA: Diagnosis not present

## 2014-07-12 DIAGNOSIS — Z4931 Encounter for adequacy testing for hemodialysis: Secondary | ICD-10-CM | POA: Diagnosis not present

## 2014-07-12 DIAGNOSIS — N186 End stage renal disease: Secondary | ICD-10-CM | POA: Diagnosis not present

## 2014-07-12 DIAGNOSIS — Z992 Dependence on renal dialysis: Secondary | ICD-10-CM | POA: Diagnosis not present

## 2014-07-12 DIAGNOSIS — D631 Anemia in chronic kidney disease: Secondary | ICD-10-CM | POA: Diagnosis not present

## 2014-07-12 DIAGNOSIS — K7689 Other specified diseases of liver: Secondary | ICD-10-CM | POA: Diagnosis not present

## 2014-07-14 DIAGNOSIS — N2581 Secondary hyperparathyroidism of renal origin: Secondary | ICD-10-CM | POA: Diagnosis not present

## 2014-07-14 DIAGNOSIS — N186 End stage renal disease: Secondary | ICD-10-CM | POA: Diagnosis not present

## 2014-07-14 DIAGNOSIS — D631 Anemia in chronic kidney disease: Secondary | ICD-10-CM | POA: Diagnosis not present

## 2014-07-14 DIAGNOSIS — Z79899 Other long term (current) drug therapy: Secondary | ICD-10-CM | POA: Diagnosis not present

## 2014-07-16 DIAGNOSIS — N186 End stage renal disease: Secondary | ICD-10-CM | POA: Diagnosis not present

## 2014-07-16 DIAGNOSIS — Z79899 Other long term (current) drug therapy: Secondary | ICD-10-CM | POA: Diagnosis not present

## 2014-07-16 DIAGNOSIS — N2581 Secondary hyperparathyroidism of renal origin: Secondary | ICD-10-CM | POA: Diagnosis not present

## 2014-07-16 DIAGNOSIS — D631 Anemia in chronic kidney disease: Secondary | ICD-10-CM | POA: Diagnosis not present

## 2014-07-17 DIAGNOSIS — N2581 Secondary hyperparathyroidism of renal origin: Secondary | ICD-10-CM | POA: Diagnosis not present

## 2014-07-17 DIAGNOSIS — N186 End stage renal disease: Secondary | ICD-10-CM | POA: Diagnosis not present

## 2014-07-17 DIAGNOSIS — Z79899 Other long term (current) drug therapy: Secondary | ICD-10-CM | POA: Diagnosis not present

## 2014-07-17 DIAGNOSIS — D631 Anemia in chronic kidney disease: Secondary | ICD-10-CM | POA: Diagnosis not present

## 2014-07-18 ENCOUNTER — Telehealth: Payer: Self-pay

## 2014-07-18 DIAGNOSIS — D631 Anemia in chronic kidney disease: Secondary | ICD-10-CM | POA: Diagnosis not present

## 2014-07-18 DIAGNOSIS — N186 End stage renal disease: Secondary | ICD-10-CM | POA: Diagnosis not present

## 2014-07-18 DIAGNOSIS — Z79899 Other long term (current) drug therapy: Secondary | ICD-10-CM | POA: Diagnosis not present

## 2014-07-18 DIAGNOSIS — N2581 Secondary hyperparathyroidism of renal origin: Secondary | ICD-10-CM | POA: Diagnosis not present

## 2014-07-18 NOTE — Telephone Encounter (Signed)
Called pt to remind him to bring his cpap to his appt on 7/11 with Dr. Brett Fairy. No answer, left message asking him to call me back.

## 2014-07-18 NOTE — Telephone Encounter (Signed)
Pt returned my call, and I asked him to please bring his cpap machine to appt with Dr. Brett Fairy on 7/11. Pt verbalized understanding.

## 2014-07-19 DIAGNOSIS — D631 Anemia in chronic kidney disease: Secondary | ICD-10-CM | POA: Diagnosis not present

## 2014-07-19 DIAGNOSIS — N2581 Secondary hyperparathyroidism of renal origin: Secondary | ICD-10-CM | POA: Diagnosis not present

## 2014-07-19 DIAGNOSIS — Z79899 Other long term (current) drug therapy: Secondary | ICD-10-CM | POA: Diagnosis not present

## 2014-07-19 DIAGNOSIS — N186 End stage renal disease: Secondary | ICD-10-CM | POA: Diagnosis not present

## 2014-07-20 DIAGNOSIS — N2581 Secondary hyperparathyroidism of renal origin: Secondary | ICD-10-CM | POA: Diagnosis not present

## 2014-07-20 DIAGNOSIS — D631 Anemia in chronic kidney disease: Secondary | ICD-10-CM | POA: Diagnosis not present

## 2014-07-20 DIAGNOSIS — Z79899 Other long term (current) drug therapy: Secondary | ICD-10-CM | POA: Diagnosis not present

## 2014-07-20 DIAGNOSIS — N186 End stage renal disease: Secondary | ICD-10-CM | POA: Diagnosis not present

## 2014-07-22 DIAGNOSIS — D631 Anemia in chronic kidney disease: Secondary | ICD-10-CM | POA: Diagnosis not present

## 2014-07-22 DIAGNOSIS — N2581 Secondary hyperparathyroidism of renal origin: Secondary | ICD-10-CM | POA: Diagnosis not present

## 2014-07-22 DIAGNOSIS — Z79899 Other long term (current) drug therapy: Secondary | ICD-10-CM | POA: Diagnosis not present

## 2014-07-22 DIAGNOSIS — N186 End stage renal disease: Secondary | ICD-10-CM | POA: Diagnosis not present

## 2014-07-23 ENCOUNTER — Encounter (INDEPENDENT_AMBULATORY_CARE_PROVIDER_SITE_OTHER): Payer: Medicare Other | Admitting: Neurology

## 2014-07-23 ENCOUNTER — Ambulatory Visit (INDEPENDENT_AMBULATORY_CARE_PROVIDER_SITE_OTHER): Payer: Medicare Other | Admitting: Neurology

## 2014-07-23 ENCOUNTER — Encounter: Payer: Self-pay | Admitting: Neurology

## 2014-07-23 VITALS — BP 120/70 | HR 82 | Resp 20 | Ht 71.0 in | Wt 261.5 lb

## 2014-07-23 DIAGNOSIS — N186 End stage renal disease: Secondary | ICD-10-CM

## 2014-07-23 DIAGNOSIS — G4733 Obstructive sleep apnea (adult) (pediatric): Secondary | ICD-10-CM | POA: Diagnosis not present

## 2014-07-23 DIAGNOSIS — Z9989 Dependence on other enabling machines and devices: Principal | ICD-10-CM

## 2014-07-23 DIAGNOSIS — I25119 Atherosclerotic heart disease of native coronary artery with unspecified angina pectoris: Secondary | ICD-10-CM

## 2014-07-23 DIAGNOSIS — Z992 Dependence on renal dialysis: Secondary | ICD-10-CM

## 2014-07-23 DIAGNOSIS — Z79899 Other long term (current) drug therapy: Secondary | ICD-10-CM | POA: Diagnosis not present

## 2014-07-23 DIAGNOSIS — N2581 Secondary hyperparathyroidism of renal origin: Secondary | ICD-10-CM | POA: Diagnosis not present

## 2014-07-23 DIAGNOSIS — D631 Anemia in chronic kidney disease: Secondary | ICD-10-CM | POA: Diagnosis not present

## 2014-07-23 NOTE — Patient Instructions (Signed)
Polysomnography (Sleep Studies) Polysomnography (PSG) is a series of tests used for detecting (diagnosing) obstructive sleep apnea and other sleep disorders. The tests measure how some parts of your body are working while you are sleeping. The tests are extensive and expensive. They are done in a sleep lab or hospital, and vary from center to center. Your caregiver may perform other more simple sleep studies and questionnaires before doing more complete and involved testing. Testing may not be covered by insurance. Some of these tests are:  An EEG (Electroencephalogram). This tests your brain waves and stages of sleep.  An EOG (Electrooculogram). This measures the movements of your eyes. It detects periods of REM (rapid eye movement) sleep, which is your dream sleep.  An EKG (Electrocardiogram). This measures your heart rhythm.  EMG (Electromyography). This is a measurement of how the muscles are working in your upper airway and your legs while sleeping.  An oximetry measurement. It measures how much oxygen (air) you are getting while sleeping.  Breathing efforts may be measured. The same test can be interpreted (understood) differently by different caregivers and centers that study sleep.  Studies may be given an apnea/hypopnea index (AHI). This is a number which is found by counting the times of no breathing or under breathing during the night, and relating those numbers to the amount of time spent in bed. When the AHI is greater than 15, the patient is likely to complain of daytime sleepiness. When the AHI is greater than 30, the patient is at increased risk for heart problems and must be followed more closely. Following the AHI also allows you to know how treatment is working. Simple oximetry (tracking the amount of oxygen that is taken in) can be used for screening patients who:  Do not have symptoms (problems) of OSA.  Have a normal Epworth Sleepiness Scale Score.  Have a low pre-test  probability of having OSA.  Have none of the upper airway problems likely to cause apnea.  Oximetry is also used to determine if treatment is effective in patients who showed significant desaturations (not getting enough oxygen) on their home sleep study. One extra measure of safety is to perform additional studies for the person who only snores. This is because no one can predict with absolute certainty who will have OSA. Those who show significant desaturations (not getting enough oxygen) are recommended to have a more detailed sleep study. Document Released: 07/05/2002 Document Revised: 03/23/2011 Document Reviewed: 03/06/2013 Exit[employer] Patient Information 2015 [employer]. This information is not intended to replace advice given to you by your health care provider. Make sure you discuss any questions you have with your health care provider.  
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
## 2014-07-24 DIAGNOSIS — Z79899 Other long term (current) drug therapy: Secondary | ICD-10-CM | POA: Diagnosis not present

## 2014-07-24 DIAGNOSIS — N186 End stage renal disease: Secondary | ICD-10-CM | POA: Diagnosis not present

## 2014-07-24 DIAGNOSIS — D631 Anemia in chronic kidney disease: Secondary | ICD-10-CM | POA: Diagnosis not present

## 2014-07-24 DIAGNOSIS — N2581 Secondary hyperparathyroidism of renal origin: Secondary | ICD-10-CM | POA: Diagnosis not present

## 2014-07-25 DIAGNOSIS — D631 Anemia in chronic kidney disease: Secondary | ICD-10-CM | POA: Diagnosis not present

## 2014-07-25 DIAGNOSIS — N186 End stage renal disease: Secondary | ICD-10-CM | POA: Diagnosis not present

## 2014-07-25 DIAGNOSIS — N2581 Secondary hyperparathyroidism of renal origin: Secondary | ICD-10-CM | POA: Diagnosis not present

## 2014-07-25 DIAGNOSIS — Z79899 Other long term (current) drug therapy: Secondary | ICD-10-CM | POA: Diagnosis not present

## 2014-07-27 DIAGNOSIS — N2581 Secondary hyperparathyroidism of renal origin: Secondary | ICD-10-CM | POA: Diagnosis not present

## 2014-07-27 DIAGNOSIS — Z79899 Other long term (current) drug therapy: Secondary | ICD-10-CM | POA: Diagnosis not present

## 2014-07-27 DIAGNOSIS — D631 Anemia in chronic kidney disease: Secondary | ICD-10-CM | POA: Diagnosis not present

## 2014-07-27 DIAGNOSIS — N186 End stage renal disease: Secondary | ICD-10-CM | POA: Diagnosis not present

## 2014-07-28 DIAGNOSIS — D631 Anemia in chronic kidney disease: Secondary | ICD-10-CM | POA: Diagnosis not present

## 2014-07-28 DIAGNOSIS — N186 End stage renal disease: Secondary | ICD-10-CM | POA: Diagnosis not present

## 2014-07-28 DIAGNOSIS — N2581 Secondary hyperparathyroidism of renal origin: Secondary | ICD-10-CM | POA: Diagnosis not present

## 2014-07-28 DIAGNOSIS — Z79899 Other long term (current) drug therapy: Secondary | ICD-10-CM | POA: Diagnosis not present

## 2014-07-30 DIAGNOSIS — D631 Anemia in chronic kidney disease: Secondary | ICD-10-CM | POA: Diagnosis not present

## 2014-07-30 DIAGNOSIS — Z79899 Other long term (current) drug therapy: Secondary | ICD-10-CM | POA: Diagnosis not present

## 2014-07-30 DIAGNOSIS — N2581 Secondary hyperparathyroidism of renal origin: Secondary | ICD-10-CM | POA: Diagnosis not present

## 2014-07-30 DIAGNOSIS — N186 End stage renal disease: Secondary | ICD-10-CM | POA: Diagnosis not present

## 2014-07-31 DIAGNOSIS — Z79899 Other long term (current) drug therapy: Secondary | ICD-10-CM | POA: Diagnosis not present

## 2014-07-31 DIAGNOSIS — N186 End stage renal disease: Secondary | ICD-10-CM | POA: Diagnosis not present

## 2014-07-31 DIAGNOSIS — N2581 Secondary hyperparathyroidism of renal origin: Secondary | ICD-10-CM | POA: Diagnosis not present

## 2014-07-31 DIAGNOSIS — D631 Anemia in chronic kidney disease: Secondary | ICD-10-CM | POA: Diagnosis not present

## 2014-08-01 DIAGNOSIS — D631 Anemia in chronic kidney disease: Secondary | ICD-10-CM | POA: Diagnosis not present

## 2014-08-01 DIAGNOSIS — N2581 Secondary hyperparathyroidism of renal origin: Secondary | ICD-10-CM | POA: Diagnosis not present

## 2014-08-01 DIAGNOSIS — N186 End stage renal disease: Secondary | ICD-10-CM | POA: Diagnosis not present

## 2014-08-01 DIAGNOSIS — Z79899 Other long term (current) drug therapy: Secondary | ICD-10-CM | POA: Diagnosis not present

## 2014-08-03 DIAGNOSIS — N186 End stage renal disease: Secondary | ICD-10-CM | POA: Diagnosis not present

## 2014-08-03 DIAGNOSIS — N2581 Secondary hyperparathyroidism of renal origin: Secondary | ICD-10-CM | POA: Diagnosis not present

## 2014-08-03 DIAGNOSIS — D631 Anemia in chronic kidney disease: Secondary | ICD-10-CM | POA: Diagnosis not present

## 2014-08-03 DIAGNOSIS — Z79899 Other long term (current) drug therapy: Secondary | ICD-10-CM | POA: Diagnosis not present

## 2014-08-04 DIAGNOSIS — N186 End stage renal disease: Secondary | ICD-10-CM | POA: Diagnosis not present

## 2014-08-04 DIAGNOSIS — Z79899 Other long term (current) drug therapy: Secondary | ICD-10-CM | POA: Diagnosis not present

## 2014-08-04 DIAGNOSIS — N2581 Secondary hyperparathyroidism of renal origin: Secondary | ICD-10-CM | POA: Diagnosis not present

## 2014-08-04 DIAGNOSIS — D631 Anemia in chronic kidney disease: Secondary | ICD-10-CM | POA: Diagnosis not present

## 2014-08-06 DIAGNOSIS — D631 Anemia in chronic kidney disease: Secondary | ICD-10-CM | POA: Diagnosis not present

## 2014-08-06 DIAGNOSIS — Z79899 Other long term (current) drug therapy: Secondary | ICD-10-CM | POA: Diagnosis not present

## 2014-08-06 DIAGNOSIS — N2581 Secondary hyperparathyroidism of renal origin: Secondary | ICD-10-CM | POA: Diagnosis not present

## 2014-08-06 DIAGNOSIS — N186 End stage renal disease: Secondary | ICD-10-CM | POA: Diagnosis not present

## 2014-08-07 DIAGNOSIS — D631 Anemia in chronic kidney disease: Secondary | ICD-10-CM | POA: Diagnosis not present

## 2014-08-07 DIAGNOSIS — N2581 Secondary hyperparathyroidism of renal origin: Secondary | ICD-10-CM | POA: Diagnosis not present

## 2014-08-07 DIAGNOSIS — N186 End stage renal disease: Secondary | ICD-10-CM | POA: Diagnosis not present

## 2014-08-07 DIAGNOSIS — Z79899 Other long term (current) drug therapy: Secondary | ICD-10-CM | POA: Diagnosis not present

## 2014-08-08 DIAGNOSIS — D631 Anemia in chronic kidney disease: Secondary | ICD-10-CM | POA: Diagnosis not present

## 2014-08-08 DIAGNOSIS — N186 End stage renal disease: Secondary | ICD-10-CM | POA: Diagnosis not present

## 2014-08-08 DIAGNOSIS — N2581 Secondary hyperparathyroidism of renal origin: Secondary | ICD-10-CM | POA: Diagnosis not present

## 2014-08-08 DIAGNOSIS — Z79899 Other long term (current) drug therapy: Secondary | ICD-10-CM | POA: Diagnosis not present

## 2014-08-09 ENCOUNTER — Other Ambulatory Visit: Payer: Self-pay

## 2014-08-09 ENCOUNTER — Telehealth: Payer: Self-pay

## 2014-08-09 DIAGNOSIS — G4733 Obstructive sleep apnea (adult) (pediatric): Secondary | ICD-10-CM

## 2014-08-09 NOTE — Telephone Encounter (Signed)
Spoke with pt regarding his home sleep test. Advised pt that osa was seen in his study and cpap titration study is advised. 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 I advised him that our office would contact him to set up the titration study. Pt verbalized understanding.

## 2014-08-10 DIAGNOSIS — D631 Anemia in chronic kidney disease: Secondary | ICD-10-CM | POA: Diagnosis not present

## 2014-08-10 DIAGNOSIS — N2581 Secondary hyperparathyroidism of renal origin: Secondary | ICD-10-CM | POA: Diagnosis not present

## 2014-08-10 DIAGNOSIS — Z79899 Other long term (current) drug therapy: Secondary | ICD-10-CM | POA: Diagnosis not present

## 2014-08-10 DIAGNOSIS — N186 End stage renal disease: Secondary | ICD-10-CM | POA: Diagnosis not present

## 2014-08-11 DIAGNOSIS — N2581 Secondary hyperparathyroidism of renal origin: Secondary | ICD-10-CM | POA: Diagnosis not present

## 2014-08-11 DIAGNOSIS — D631 Anemia in chronic kidney disease: Secondary | ICD-10-CM | POA: Diagnosis not present

## 2014-08-11 DIAGNOSIS — Z79899 Other long term (current) drug therapy: Secondary | ICD-10-CM | POA: Diagnosis not present

## 2014-08-11 DIAGNOSIS — N186 End stage renal disease: Secondary | ICD-10-CM | POA: Diagnosis not present

## 2014-08-12 DIAGNOSIS — N186 End stage renal disease: Secondary | ICD-10-CM | POA: Diagnosis not present

## 2014-08-12 DIAGNOSIS — Z992 Dependence on renal dialysis: Secondary | ICD-10-CM | POA: Diagnosis not present

## 2014-08-13 DIAGNOSIS — N186 End stage renal disease: Secondary | ICD-10-CM | POA: Diagnosis not present

## 2014-08-13 DIAGNOSIS — E878 Other disorders of electrolyte and fluid balance, not elsewhere classified: Secondary | ICD-10-CM | POA: Diagnosis not present

## 2014-08-13 DIAGNOSIS — D631 Anemia in chronic kidney disease: Secondary | ICD-10-CM | POA: Diagnosis not present

## 2014-08-13 DIAGNOSIS — Z79899 Other long term (current) drug therapy: Secondary | ICD-10-CM | POA: Diagnosis not present

## 2014-08-13 DIAGNOSIS — N2581 Secondary hyperparathyroidism of renal origin: Secondary | ICD-10-CM | POA: Diagnosis not present

## 2014-08-14 DIAGNOSIS — D631 Anemia in chronic kidney disease: Secondary | ICD-10-CM | POA: Diagnosis not present

## 2014-08-14 DIAGNOSIS — N186 End stage renal disease: Secondary | ICD-10-CM | POA: Diagnosis not present

## 2014-08-14 DIAGNOSIS — N2581 Secondary hyperparathyroidism of renal origin: Secondary | ICD-10-CM | POA: Diagnosis not present

## 2014-08-14 DIAGNOSIS — E878 Other disorders of electrolyte and fluid balance, not elsewhere classified: Secondary | ICD-10-CM | POA: Diagnosis not present

## 2014-08-14 DIAGNOSIS — Z79899 Other long term (current) drug therapy: Secondary | ICD-10-CM | POA: Diagnosis not present

## 2014-08-15 DIAGNOSIS — N186 End stage renal disease: Secondary | ICD-10-CM | POA: Diagnosis not present

## 2014-08-15 DIAGNOSIS — D631 Anemia in chronic kidney disease: Secondary | ICD-10-CM | POA: Diagnosis not present

## 2014-08-15 DIAGNOSIS — N2581 Secondary hyperparathyroidism of renal origin: Secondary | ICD-10-CM | POA: Diagnosis not present

## 2014-08-15 DIAGNOSIS — E878 Other disorders of electrolyte and fluid balance, not elsewhere classified: Secondary | ICD-10-CM | POA: Diagnosis not present

## 2014-08-15 DIAGNOSIS — Z79899 Other long term (current) drug therapy: Secondary | ICD-10-CM | POA: Diagnosis not present

## 2014-08-17 DIAGNOSIS — E878 Other disorders of electrolyte and fluid balance, not elsewhere classified: Secondary | ICD-10-CM | POA: Diagnosis not present

## 2014-08-17 DIAGNOSIS — N2581 Secondary hyperparathyroidism of renal origin: Secondary | ICD-10-CM | POA: Diagnosis not present

## 2014-08-17 DIAGNOSIS — D631 Anemia in chronic kidney disease: Secondary | ICD-10-CM | POA: Diagnosis not present

## 2014-08-17 DIAGNOSIS — Z79899 Other long term (current) drug therapy: Secondary | ICD-10-CM | POA: Diagnosis not present

## 2014-08-17 DIAGNOSIS — N186 End stage renal disease: Secondary | ICD-10-CM | POA: Diagnosis not present

## 2014-08-18 DIAGNOSIS — E878 Other disorders of electrolyte and fluid balance, not elsewhere classified: Secondary | ICD-10-CM | POA: Diagnosis not present

## 2014-08-18 DIAGNOSIS — N186 End stage renal disease: Secondary | ICD-10-CM | POA: Diagnosis not present

## 2014-08-18 DIAGNOSIS — D631 Anemia in chronic kidney disease: Secondary | ICD-10-CM | POA: Diagnosis not present

## 2014-08-18 DIAGNOSIS — Z79899 Other long term (current) drug therapy: Secondary | ICD-10-CM | POA: Diagnosis not present

## 2014-08-18 DIAGNOSIS — N2581 Secondary hyperparathyroidism of renal origin: Secondary | ICD-10-CM | POA: Diagnosis not present

## 2014-08-20 DIAGNOSIS — D631 Anemia in chronic kidney disease: Secondary | ICD-10-CM | POA: Diagnosis not present

## 2014-08-20 DIAGNOSIS — E878 Other disorders of electrolyte and fluid balance, not elsewhere classified: Secondary | ICD-10-CM | POA: Diagnosis not present

## 2014-08-20 DIAGNOSIS — N2581 Secondary hyperparathyroidism of renal origin: Secondary | ICD-10-CM | POA: Diagnosis not present

## 2014-08-20 DIAGNOSIS — N186 End stage renal disease: Secondary | ICD-10-CM | POA: Diagnosis not present

## 2014-08-20 DIAGNOSIS — Z79899 Other long term (current) drug therapy: Secondary | ICD-10-CM | POA: Diagnosis not present

## 2014-08-21 DIAGNOSIS — N2581 Secondary hyperparathyroidism of renal origin: Secondary | ICD-10-CM | POA: Diagnosis not present

## 2014-08-21 DIAGNOSIS — E878 Other disorders of electrolyte and fluid balance, not elsewhere classified: Secondary | ICD-10-CM | POA: Diagnosis not present

## 2014-08-21 DIAGNOSIS — Z79899 Other long term (current) drug therapy: Secondary | ICD-10-CM | POA: Diagnosis not present

## 2014-08-21 DIAGNOSIS — N186 End stage renal disease: Secondary | ICD-10-CM | POA: Diagnosis not present

## 2014-08-21 DIAGNOSIS — D631 Anemia in chronic kidney disease: Secondary | ICD-10-CM | POA: Diagnosis not present

## 2014-08-22 DIAGNOSIS — Z79899 Other long term (current) drug therapy: Secondary | ICD-10-CM | POA: Diagnosis not present

## 2014-08-22 DIAGNOSIS — D631 Anemia in chronic kidney disease: Secondary | ICD-10-CM | POA: Diagnosis not present

## 2014-08-22 DIAGNOSIS — N186 End stage renal disease: Secondary | ICD-10-CM | POA: Diagnosis not present

## 2014-08-22 DIAGNOSIS — E878 Other disorders of electrolyte and fluid balance, not elsewhere classified: Secondary | ICD-10-CM | POA: Diagnosis not present

## 2014-08-22 DIAGNOSIS — N2581 Secondary hyperparathyroidism of renal origin: Secondary | ICD-10-CM | POA: Diagnosis not present

## 2014-08-24 DIAGNOSIS — Z79899 Other long term (current) drug therapy: Secondary | ICD-10-CM | POA: Diagnosis not present

## 2014-08-24 DIAGNOSIS — N2581 Secondary hyperparathyroidism of renal origin: Secondary | ICD-10-CM | POA: Diagnosis not present

## 2014-08-24 DIAGNOSIS — N186 End stage renal disease: Secondary | ICD-10-CM | POA: Diagnosis not present

## 2014-08-24 DIAGNOSIS — E878 Other disorders of electrolyte and fluid balance, not elsewhere classified: Secondary | ICD-10-CM | POA: Diagnosis not present

## 2014-08-24 DIAGNOSIS — D631 Anemia in chronic kidney disease: Secondary | ICD-10-CM | POA: Diagnosis not present

## 2014-08-25 DIAGNOSIS — N186 End stage renal disease: Secondary | ICD-10-CM | POA: Diagnosis not present

## 2014-08-25 DIAGNOSIS — E878 Other disorders of electrolyte and fluid balance, not elsewhere classified: Secondary | ICD-10-CM | POA: Diagnosis not present

## 2014-08-25 DIAGNOSIS — D631 Anemia in chronic kidney disease: Secondary | ICD-10-CM | POA: Diagnosis not present

## 2014-08-25 DIAGNOSIS — Z79899 Other long term (current) drug therapy: Secondary | ICD-10-CM | POA: Diagnosis not present

## 2014-08-25 DIAGNOSIS — N2581 Secondary hyperparathyroidism of renal origin: Secondary | ICD-10-CM | POA: Diagnosis not present

## 2014-08-27 DIAGNOSIS — Z79899 Other long term (current) drug therapy: Secondary | ICD-10-CM | POA: Diagnosis not present

## 2014-08-27 DIAGNOSIS — D631 Anemia in chronic kidney disease: Secondary | ICD-10-CM | POA: Diagnosis not present

## 2014-08-27 DIAGNOSIS — N2581 Secondary hyperparathyroidism of renal origin: Secondary | ICD-10-CM | POA: Diagnosis not present

## 2014-08-27 DIAGNOSIS — E878 Other disorders of electrolyte and fluid balance, not elsewhere classified: Secondary | ICD-10-CM | POA: Diagnosis not present

## 2014-08-27 DIAGNOSIS — N186 End stage renal disease: Secondary | ICD-10-CM | POA: Diagnosis not present

## 2014-08-28 ENCOUNTER — Telehealth: Payer: Self-pay

## 2014-08-28 DIAGNOSIS — N186 End stage renal disease: Secondary | ICD-10-CM | POA: Diagnosis not present

## 2014-08-28 DIAGNOSIS — Z79899 Other long term (current) drug therapy: Secondary | ICD-10-CM | POA: Diagnosis not present

## 2014-08-28 DIAGNOSIS — E878 Other disorders of electrolyte and fluid balance, not elsewhere classified: Secondary | ICD-10-CM | POA: Diagnosis not present

## 2014-08-28 DIAGNOSIS — N2581 Secondary hyperparathyroidism of renal origin: Secondary | ICD-10-CM | POA: Diagnosis not present

## 2014-08-28 DIAGNOSIS — D631 Anemia in chronic kidney disease: Secondary | ICD-10-CM | POA: Diagnosis not present

## 2014-08-28 NOTE — Telephone Encounter (Signed)
Spoke to pt regarding his appt on 8/18. He needs a 30 minute visit per Dr. Brett Fairy. I called pt to ask him to reschedule. He was actually unaware that he had an appt on 8/18, he requested a reschedule. Appt made for 8/29 at 2:30. Pt verbalized understanding to arrive 15 minutes early and to bring his cpap machine.

## 2014-08-29 ENCOUNTER — Ambulatory Visit (INDEPENDENT_AMBULATORY_CARE_PROVIDER_SITE_OTHER): Payer: Medicare Other | Admitting: Neurology

## 2014-08-29 DIAGNOSIS — E878 Other disorders of electrolyte and fluid balance, not elsewhere classified: Secondary | ICD-10-CM | POA: Diagnosis not present

## 2014-08-29 DIAGNOSIS — Z79899 Other long term (current) drug therapy: Secondary | ICD-10-CM | POA: Diagnosis not present

## 2014-08-29 DIAGNOSIS — G4733 Obstructive sleep apnea (adult) (pediatric): Secondary | ICD-10-CM

## 2014-08-29 DIAGNOSIS — N2581 Secondary hyperparathyroidism of renal origin: Secondary | ICD-10-CM | POA: Diagnosis not present

## 2014-08-29 DIAGNOSIS — N186 End stage renal disease: Secondary | ICD-10-CM | POA: Diagnosis not present

## 2014-08-29 DIAGNOSIS — D631 Anemia in chronic kidney disease: Secondary | ICD-10-CM | POA: Diagnosis not present

## 2014-08-30 ENCOUNTER — Ambulatory Visit: Payer: Medicare Other | Admitting: Neurology

## 2014-08-30 NOTE — Sleep Study (Signed)
Please see the scanned sleep study interpretation located in the procedure tab in the chart view section.  

## 2014-08-31 DIAGNOSIS — N186 End stage renal disease: Secondary | ICD-10-CM | POA: Diagnosis not present

## 2014-08-31 DIAGNOSIS — D631 Anemia in chronic kidney disease: Secondary | ICD-10-CM | POA: Diagnosis not present

## 2014-08-31 DIAGNOSIS — N2581 Secondary hyperparathyroidism of renal origin: Secondary | ICD-10-CM | POA: Diagnosis not present

## 2014-08-31 DIAGNOSIS — E878 Other disorders of electrolyte and fluid balance, not elsewhere classified: Secondary | ICD-10-CM | POA: Diagnosis not present

## 2014-08-31 DIAGNOSIS — Z79899 Other long term (current) drug therapy: Secondary | ICD-10-CM | POA: Diagnosis not present

## 2014-09-01 DIAGNOSIS — Z79899 Other long term (current) drug therapy: Secondary | ICD-10-CM | POA: Diagnosis not present

## 2014-09-01 DIAGNOSIS — E878 Other disorders of electrolyte and fluid balance, not elsewhere classified: Secondary | ICD-10-CM | POA: Diagnosis not present

## 2014-09-01 DIAGNOSIS — N2581 Secondary hyperparathyroidism of renal origin: Secondary | ICD-10-CM | POA: Diagnosis not present

## 2014-09-01 DIAGNOSIS — D631 Anemia in chronic kidney disease: Secondary | ICD-10-CM | POA: Diagnosis not present

## 2014-09-01 DIAGNOSIS — N186 End stage renal disease: Secondary | ICD-10-CM | POA: Diagnosis not present

## 2014-09-03 ENCOUNTER — Encounter: Payer: Self-pay | Admitting: Medical

## 2014-09-03 ENCOUNTER — Telehealth: Payer: Self-pay | Admitting: Medical

## 2014-09-03 ENCOUNTER — Ambulatory Visit (INDEPENDENT_AMBULATORY_CARE_PROVIDER_SITE_OTHER): Payer: Medicare Other | Admitting: Medical

## 2014-09-03 ENCOUNTER — Other Ambulatory Visit: Payer: Self-pay

## 2014-09-03 VITALS — BP 100/64 | HR 74 | Temp 97.7°F | Resp 18 | Wt 264.2 lb

## 2014-09-03 DIAGNOSIS — Z9989 Dependence on other enabling machines and devices: Secondary | ICD-10-CM

## 2014-09-03 DIAGNOSIS — N644 Mastodynia: Secondary | ICD-10-CM

## 2014-09-03 DIAGNOSIS — Z79899 Other long term (current) drug therapy: Secondary | ICD-10-CM | POA: Diagnosis not present

## 2014-09-03 DIAGNOSIS — Z992 Dependence on renal dialysis: Secondary | ICD-10-CM

## 2014-09-03 DIAGNOSIS — D631 Anemia in chronic kidney disease: Secondary | ICD-10-CM | POA: Diagnosis not present

## 2014-09-03 DIAGNOSIS — I25119 Atherosclerotic heart disease of native coronary artery with unspecified angina pectoris: Secondary | ICD-10-CM | POA: Diagnosis not present

## 2014-09-03 DIAGNOSIS — G4733 Obstructive sleep apnea (adult) (pediatric): Secondary | ICD-10-CM | POA: Diagnosis not present

## 2014-09-03 DIAGNOSIS — N2581 Secondary hyperparathyroidism of renal origin: Secondary | ICD-10-CM | POA: Diagnosis not present

## 2014-09-03 DIAGNOSIS — N63 Unspecified lump in unspecified breast: Secondary | ICD-10-CM | POA: Insufficient documentation

## 2014-09-03 DIAGNOSIS — N186 End stage renal disease: Secondary | ICD-10-CM | POA: Diagnosis not present

## 2014-09-03 DIAGNOSIS — N6320 Unspecified lump in the left breast, unspecified quadrant: Secondary | ICD-10-CM

## 2014-09-03 DIAGNOSIS — E878 Other disorders of electrolyte and fluid balance, not elsewhere classified: Secondary | ICD-10-CM | POA: Diagnosis not present

## 2014-09-03 NOTE — Telephone Encounter (Signed)
Refer for left breast diagnostic mammogram and ultrasound

## 2014-09-03 NOTE — Telephone Encounter (Signed)
I have put order in epic
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
## 2014-09-03 NOTE — Telephone Encounter (Signed)
Pt HAS APPOINTMENT 09/05/14 AT [facility] AT 1:15 PT IS AWARE

## 2014-09-04 ENCOUNTER — Telehealth: Payer: Self-pay

## 2014-09-04 DIAGNOSIS — G4733 Obstructive sleep apnea (adult) (pediatric): Secondary | ICD-10-CM

## 2014-09-04 DIAGNOSIS — Z79899 Other long term (current) drug therapy: Secondary | ICD-10-CM | POA: Diagnosis not present

## 2014-09-04 DIAGNOSIS — N2581 Secondary hyperparathyroidism of renal origin: Secondary | ICD-10-CM | POA: Diagnosis not present

## 2014-09-04 DIAGNOSIS — N186 End stage renal disease: Secondary | ICD-10-CM | POA: Diagnosis not present

## 2014-09-04 DIAGNOSIS — D631 Anemia in chronic kidney disease: Secondary | ICD-10-CM | POA: Diagnosis not present

## 2014-09-04 DIAGNOSIS — Z9989 Dependence on other enabling machines and devices: Principal | ICD-10-CM

## 2014-09-04 DIAGNOSIS — E878 Other disorders of electrolyte and fluid balance, not elsewhere classified: Secondary | ICD-10-CM | POA: Diagnosis not present

## 2014-09-04 NOTE — Telephone Encounter (Signed)
Spoke to pt about cpap titration results. Advised him that his cpap titration showed significant improvement in osa during titration and that Dr. Brett Fairy recommends starting auto cpap 6-15 cm H2O. Pt wishes to proceed with this and asked that I send the order to [employer]. He wishes to cancel his appt on 09/10/14 and make an appt on 10/17 at 10:30 for a cpap follow up. Pt verbalized understanding to arrive 15 minutes early and to bring cpap. Will fax order to [employer].

## 2014-09-05 ENCOUNTER — Other Ambulatory Visit: Payer: Medicare Other

## 2014-09-05 DIAGNOSIS — Z79899 Other long term (current) drug therapy: Secondary | ICD-10-CM | POA: Diagnosis not present

## 2014-09-05 DIAGNOSIS — N2581 Secondary hyperparathyroidism of renal origin: Secondary | ICD-10-CM | POA: Diagnosis not present

## 2014-09-05 DIAGNOSIS — N186 End stage renal disease: Secondary | ICD-10-CM | POA: Diagnosis not present

## 2014-09-05 DIAGNOSIS — D631 Anemia in chronic kidney disease: Secondary | ICD-10-CM | POA: Diagnosis not present

## 2014-09-05 DIAGNOSIS — E878 Other disorders of electrolyte and fluid balance, not elsewhere classified: Secondary | ICD-10-CM | POA: Diagnosis not present

## 2014-09-07 DIAGNOSIS — N186 End stage renal disease: Secondary | ICD-10-CM | POA: Diagnosis not present

## 2014-09-07 DIAGNOSIS — D631 Anemia in chronic kidney disease: Secondary | ICD-10-CM | POA: Diagnosis not present

## 2014-09-07 DIAGNOSIS — Z79899 Other long term (current) drug therapy: Secondary | ICD-10-CM | POA: Diagnosis not present

## 2014-09-07 DIAGNOSIS — N2581 Secondary hyperparathyroidism of renal origin: Secondary | ICD-10-CM | POA: Diagnosis not present

## 2014-09-07 DIAGNOSIS — E878 Other disorders of electrolyte and fluid balance, not elsewhere classified: Secondary | ICD-10-CM | POA: Diagnosis not present

## 2014-09-08 DIAGNOSIS — Z79899 Other long term (current) drug therapy: Secondary | ICD-10-CM | POA: Diagnosis not present

## 2014-09-08 DIAGNOSIS — N186 End stage renal disease: Secondary | ICD-10-CM | POA: Diagnosis not present

## 2014-09-08 DIAGNOSIS — E878 Other disorders of electrolyte and fluid balance, not elsewhere classified: Secondary | ICD-10-CM | POA: Diagnosis not present

## 2014-09-08 DIAGNOSIS — N2581 Secondary hyperparathyroidism of renal origin: Secondary | ICD-10-CM | POA: Diagnosis not present

## 2014-09-08 DIAGNOSIS — D631 Anemia in chronic kidney disease: Secondary | ICD-10-CM | POA: Diagnosis not present

## 2014-09-10 ENCOUNTER — Ambulatory Visit
Admission: RE | Admit: 2014-09-10 | Discharge: 2014-09-10 | Disposition: A | Discharge status: Home / Self Care | Payer: Medicare Other | Source: Ambulatory Visit | Attending: Medical | Admitting: Medical

## 2014-09-10 ENCOUNTER — Ambulatory Visit: Payer: Self-pay | Admitting: Neurology

## 2014-09-10 DIAGNOSIS — N2581 Secondary hyperparathyroidism of renal origin: Secondary | ICD-10-CM | POA: Diagnosis not present

## 2014-09-10 DIAGNOSIS — N644 Mastodynia: Secondary | ICD-10-CM

## 2014-09-10 DIAGNOSIS — D631 Anemia in chronic kidney disease: Secondary | ICD-10-CM | POA: Diagnosis not present

## 2014-09-10 DIAGNOSIS — E878 Other disorders of electrolyte and fluid balance, not elsewhere classified: Secondary | ICD-10-CM | POA: Diagnosis not present

## 2014-09-10 DIAGNOSIS — N186 End stage renal disease: Secondary | ICD-10-CM | POA: Diagnosis not present

## 2014-09-10 DIAGNOSIS — N63 Unspecified lump in breast: Secondary | ICD-10-CM | POA: Diagnosis not present

## 2014-09-10 DIAGNOSIS — Z79899 Other long term (current) drug therapy: Secondary | ICD-10-CM | POA: Diagnosis not present

## 2014-09-12 DIAGNOSIS — N186 End stage renal disease: Secondary | ICD-10-CM | POA: Diagnosis not present

## 2014-09-12 DIAGNOSIS — N2581 Secondary hyperparathyroidism of renal origin: Secondary | ICD-10-CM | POA: Diagnosis not present

## 2014-09-12 DIAGNOSIS — E878 Other disorders of electrolyte and fluid balance, not elsewhere classified: Secondary | ICD-10-CM | POA: Diagnosis not present

## 2014-09-12 DIAGNOSIS — Z79899 Other long term (current) drug therapy: Secondary | ICD-10-CM | POA: Diagnosis not present

## 2014-09-12 DIAGNOSIS — D631 Anemia in chronic kidney disease: Secondary | ICD-10-CM | POA: Diagnosis not present

## 2014-09-12 DIAGNOSIS — Z992 Dependence on renal dialysis: Secondary | ICD-10-CM | POA: Diagnosis not present

## 2014-09-13 DIAGNOSIS — E784 Other hyperlipidemia: Secondary | ICD-10-CM | POA: Diagnosis not present

## 2014-09-13 DIAGNOSIS — N186 End stage renal disease: Secondary | ICD-10-CM | POA: Diagnosis not present

## 2014-09-13 DIAGNOSIS — Z79899 Other long term (current) drug therapy: Secondary | ICD-10-CM | POA: Diagnosis not present

## 2014-09-13 DIAGNOSIS — D631 Anemia in chronic kidney disease: Secondary | ICD-10-CM | POA: Diagnosis not present

## 2014-09-18 ENCOUNTER — Telehealth: Payer: Self-pay

## 2014-09-18 NOTE — Telephone Encounter (Signed)
Patient called in stating that he got a call from our office last Thurs. Wanting to speak with him about his mammogram.  I could not find any notes in the system.  Please advise.  I will call him back tomorrow.

## 2014-09-19 ENCOUNTER — Telehealth: Payer: Self-pay | Admitting: Medical

## 2014-09-19 NOTE — Telephone Encounter (Signed)
Pt has already read his results in my chart

## 2014-09-19 NOTE — Telephone Encounter (Signed)
Results were entered under the mammogram result.   He was called, message was left I believe 3 times per our notes.  His Mammogram shows gynecomastia, fatty breast tissue.  This can be caused by several things. Is he drinking alcohol or smoking any marijuana? Both can cause gynecomastia. If not, I suspect his gynecomastia findings are due to hormonal changes with being on dialysis and weight in general.  There were no worrisome findings that require biopsy.

## 2014-09-19 NOTE — Telephone Encounter (Signed)
Left message for patient to call back  

## 2014-10-01 DIAGNOSIS — C61 Malignant neoplasm of prostate: Secondary | ICD-10-CM | POA: Diagnosis not present

## 2014-10-01 DIAGNOSIS — R972 Elevated prostate specific antigen [PSA]: Secondary | ICD-10-CM | POA: Diagnosis not present

## 2014-10-01 HISTORY — PX: PROSTATE BIOPSY: SHX241

## 2014-10-01 NOTE — Telephone Encounter (Addendum)
Pt called and states that he needs rx for a new cpap machine for new settings. Pt was advised to call [employer] as well to check on status. May call pt at [phone]

## 2014-10-01 NOTE — Telephone Encounter (Signed)
Spoke to [employer]. They verified that they did have the new order faxed to them on 8/23. They are confirming the status of the request and they said they would call back if they needed anything further from [place].  I called and advised pt of this. Pt verbalized understanding.

## 2014-10-04 ENCOUNTER — Telehealth: Payer: Self-pay | Admitting: Neurology

## 2014-10-04 NOTE — Telephone Encounter (Signed)
Spoke with patient, he needs copy of sleep apnea test and last 2 visits for [employer]. Left copies up front.

## 2014-10-04 NOTE — Telephone Encounter (Signed)
Patient called regarding renewal of CDL license. Needs a statement from Dr. Brett Fairy of how long she has been treating him and the condition that she has been treating him for (sleep apnea). Cell) [phone], Home) [phone].

## 2014-10-04 NOTE — Telephone Encounter (Signed)
Spoke to pt. He says that Abigail Butts at [facility] has taken care of what he needs. I advised pt to call [employer] back if he needs anything further from [place]. Pt verbalized understanding.

## 2014-10-08 DIAGNOSIS — C61 Malignant neoplasm of prostate: Secondary | ICD-10-CM | POA: Diagnosis not present

## 2014-10-12 DIAGNOSIS — N186 End stage renal disease: Secondary | ICD-10-CM | POA: Diagnosis not present

## 2014-10-12 DIAGNOSIS — Z992 Dependence on renal dialysis: Secondary | ICD-10-CM | POA: Diagnosis not present

## 2014-10-13 DIAGNOSIS — D631 Anemia in chronic kidney disease: Secondary | ICD-10-CM | POA: Diagnosis not present

## 2014-10-13 DIAGNOSIS — Z79899 Other long term (current) drug therapy: Secondary | ICD-10-CM | POA: Diagnosis not present

## 2014-10-13 DIAGNOSIS — N186 End stage renal disease: Secondary | ICD-10-CM | POA: Diagnosis not present

## 2014-10-15 DIAGNOSIS — N186 End stage renal disease: Secondary | ICD-10-CM | POA: Diagnosis not present

## 2014-10-15 DIAGNOSIS — Z79899 Other long term (current) drug therapy: Secondary | ICD-10-CM | POA: Diagnosis not present

## 2014-10-15 DIAGNOSIS — D631 Anemia in chronic kidney disease: Secondary | ICD-10-CM | POA: Diagnosis not present

## 2014-10-17 DIAGNOSIS — D631 Anemia in chronic kidney disease: Secondary | ICD-10-CM | POA: Diagnosis not present

## 2014-10-17 DIAGNOSIS — N186 End stage renal disease: Secondary | ICD-10-CM | POA: Diagnosis not present

## 2014-10-17 DIAGNOSIS — Z79899 Other long term (current) drug therapy: Secondary | ICD-10-CM | POA: Diagnosis not present

## 2014-10-19 DIAGNOSIS — Z79899 Other long term (current) drug therapy: Secondary | ICD-10-CM | POA: Diagnosis not present

## 2014-10-19 DIAGNOSIS — D631 Anemia in chronic kidney disease: Secondary | ICD-10-CM | POA: Diagnosis not present

## 2014-10-19 DIAGNOSIS — N186 End stage renal disease: Secondary | ICD-10-CM | POA: Diagnosis not present

## 2014-10-21 DIAGNOSIS — N186 End stage renal disease: Secondary | ICD-10-CM | POA: Diagnosis not present

## 2014-10-21 DIAGNOSIS — Z79899 Other long term (current) drug therapy: Secondary | ICD-10-CM | POA: Diagnosis not present

## 2014-10-21 DIAGNOSIS — D631 Anemia in chronic kidney disease: Secondary | ICD-10-CM | POA: Diagnosis not present

## 2014-10-22 DIAGNOSIS — D631 Anemia in chronic kidney disease: Secondary | ICD-10-CM | POA: Diagnosis not present

## 2014-10-22 DIAGNOSIS — C61 Malignant neoplasm of prostate: Secondary | ICD-10-CM | POA: Diagnosis not present

## 2014-10-22 DIAGNOSIS — Z79899 Other long term (current) drug therapy: Secondary | ICD-10-CM | POA: Diagnosis not present

## 2014-10-22 DIAGNOSIS — N186 End stage renal disease: Secondary | ICD-10-CM | POA: Diagnosis not present

## 2014-10-23 DIAGNOSIS — Z79899 Other long term (current) drug therapy: Secondary | ICD-10-CM | POA: Diagnosis not present

## 2014-10-23 DIAGNOSIS — D631 Anemia in chronic kidney disease: Secondary | ICD-10-CM | POA: Diagnosis not present

## 2014-10-23 DIAGNOSIS — N186 End stage renal disease: Secondary | ICD-10-CM | POA: Diagnosis not present

## 2014-10-24 ENCOUNTER — Telehealth: Payer: Self-pay | Admitting: Neurology

## 2014-10-24 DIAGNOSIS — N186 End stage renal disease: Secondary | ICD-10-CM | POA: Diagnosis not present

## 2014-10-24 DIAGNOSIS — D631 Anemia in chronic kidney disease: Secondary | ICD-10-CM | POA: Diagnosis not present

## 2014-10-24 DIAGNOSIS — Z79899 Other long term (current) drug therapy: Secondary | ICD-10-CM | POA: Diagnosis not present

## 2014-10-24 NOTE — Telephone Encounter (Signed)
Spoke to [facility] at [employer]. He cannot find in the system where the settings were changed for the pt's cpap. He will speak to someone regarding that tomorrow and call me back. He does say that the nasal pillows and supplies were mailed to the pt. I asked him to please call the pt back because the pt is concerned that he has not heard from [employer] yet. Mannie said he would call the pt today.

## 2014-10-24 NOTE — Telephone Encounter (Signed)
Spoke with patient about nasal cannula. He hasnot received his nasal pillows from [employer]. He was wondering what the hold up was. He said he spoke with them and they said they did not get order and they would call us to get it. He has not heard back from them. He did not know where this company was located. It looks like the closest one is in [facility] form online search He did not have the phone number with him.It is at at his house. Let me know if I need to call him back tomorrow to get phone number of place.

## 2014-10-24 NOTE — Telephone Encounter (Signed)
Amber with [employer] is calling regarding the patient. Amber says the patient already gets his supplies from [employer] will put in an order to have new supplies mailed to the patient. If you have any questions please call Amber. Thank you.

## 2014-10-24 NOTE — Telephone Encounter (Signed)
[employer] called on behalf of the pt. The pt is wondering if he can have the nasal canula vs the mask for cpap. Please call pt  and advise [phone]

## 2014-10-25 DIAGNOSIS — D631 Anemia in chronic kidney disease: Secondary | ICD-10-CM | POA: Diagnosis not present

## 2014-10-25 DIAGNOSIS — N186 End stage renal disease: Secondary | ICD-10-CM | POA: Diagnosis not present

## 2014-10-25 DIAGNOSIS — Z79899 Other long term (current) drug therapy: Secondary | ICD-10-CM | POA: Diagnosis not present

## 2014-10-27 DIAGNOSIS — N186 End stage renal disease: Secondary | ICD-10-CM | POA: Diagnosis not present

## 2014-10-27 DIAGNOSIS — D631 Anemia in chronic kidney disease: Secondary | ICD-10-CM | POA: Diagnosis not present

## 2014-10-27 DIAGNOSIS — Z79899 Other long term (current) drug therapy: Secondary | ICD-10-CM | POA: Diagnosis not present

## 2014-10-29 ENCOUNTER — Encounter: Payer: Self-pay | Admitting: Radiation Oncology

## 2014-10-29 ENCOUNTER — Ambulatory Visit: Payer: Self-pay | Admitting: Neurology

## 2014-10-29 DIAGNOSIS — N186 End stage renal disease: Secondary | ICD-10-CM | POA: Diagnosis not present

## 2014-10-29 DIAGNOSIS — D631 Anemia in chronic kidney disease: Secondary | ICD-10-CM | POA: Diagnosis not present

## 2014-10-29 DIAGNOSIS — Z79899 Other long term (current) drug therapy: Secondary | ICD-10-CM | POA: Diagnosis not present

## 2014-10-29 NOTE — Progress Notes (Signed)
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 His PSA was 5.17 1 year prior to this visit  Past/Anticipated interventions by urology, if any: prostate Biopsy  Past/Anticipated interventions by medical oncology, if any: Unknown  Weight changes, if any:  Bowel/Bladder complaints, if any: Reported urgency and weak Stream 06/2014  Nausea/Vomiting, if any:   Pain issues, if any:   SAFETY ISSUES:  Prior radiation?  Pacemaker/ICD?  Possible current pregnancy? N/A  Is the patient on methotrexate? No  Current Complaints / other details:  Dialysis for ESRD and is on the transplant list Large ventral hernia

## 2014-10-30 ENCOUNTER — Ambulatory Visit
Admission: RE | Admit: 2014-10-30 | Discharge: 2014-10-30 | Disposition: A | Discharge status: Home / Self Care | Payer: Medicare Other | Source: Ambulatory Visit | Attending: Radiation Oncology | Admitting: Radiation Oncology

## 2014-10-30 ENCOUNTER — Encounter: Payer: Self-pay | Admitting: Radiation Oncology

## 2014-10-30 ENCOUNTER — Telehealth: Payer: Self-pay | Admitting: *Deleted

## 2014-10-30 VITALS — BP 135/73 | HR 79 | Temp 98.6°F | Resp 20 | Ht 71.0 in | Wt 266.6 lb

## 2014-10-30 DIAGNOSIS — Z510 Encounter for antineoplastic radiation therapy: Secondary | ICD-10-CM | POA: Insufficient documentation

## 2014-10-30 DIAGNOSIS — D638 Anemia in other chronic diseases classified elsewhere: Secondary | ICD-10-CM | POA: Diagnosis not present

## 2014-10-30 DIAGNOSIS — Z809 Family history of malignant neoplasm, unspecified: Secondary | ICD-10-CM | POA: Diagnosis not present

## 2014-10-30 DIAGNOSIS — N186 End stage renal disease: Secondary | ICD-10-CM | POA: Insufficient documentation

## 2014-10-30 DIAGNOSIS — Z8249 Family history of ischemic heart disease and other diseases of the circulatory system: Secondary | ICD-10-CM | POA: Insufficient documentation

## 2014-10-30 DIAGNOSIS — D696 Thrombocytopenia, unspecified: Secondary | ICD-10-CM | POA: Insufficient documentation

## 2014-10-30 DIAGNOSIS — Z833 Family history of diabetes mellitus: Secondary | ICD-10-CM | POA: Diagnosis not present

## 2014-10-30 DIAGNOSIS — I10 Essential (primary) hypertension: Secondary | ICD-10-CM | POA: Diagnosis not present

## 2014-10-30 DIAGNOSIS — I252 Old myocardial infarction: Secondary | ICD-10-CM | POA: Diagnosis not present

## 2014-10-30 DIAGNOSIS — F1010 Alcohol abuse, uncomplicated: Secondary | ICD-10-CM | POA: Insufficient documentation

## 2014-10-30 DIAGNOSIS — C61 Malignant neoplasm of prostate: Secondary | ICD-10-CM | POA: Insufficient documentation

## 2014-10-30 DIAGNOSIS — Z7982 Long term (current) use of aspirin: Secondary | ICD-10-CM | POA: Diagnosis not present

## 2014-10-30 DIAGNOSIS — Z992 Dependence on renal dialysis: Secondary | ICD-10-CM | POA: Diagnosis not present

## 2014-10-30 HISTORY — DX: Malignant neoplasm of prostate: C61

## 2014-10-30 NOTE — Telephone Encounter (Signed)
CALLED PATIENT TO INFORM OF PRE-SEED APPT. ON 11-01-14 @ 8 AM, SPOKE WITH PATIENT AND HE IS AWARE OF THIS APPT.
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
## 2014-10-30 NOTE — Progress Notes (Signed)
Please see the Nurse Progress Note in the MD Initial Consult Encounter for this patient. 

## 2014-10-30 NOTE — Progress Notes (Signed)
GU Location of Tumor / Histology: Adenocarcinoma of the Prostate   If Prostate Cancer, Gleason Score is (3 + 4) and PSA is (4.71) Volume=36g  [name] presented with a PSA of 4.71 to Urology on 07/02/14. His PSA was 5.17 1 year prior to this visit  Past/Anticipated interventions by urology, if any: prostate Biopsy  Past/Anticipated interventions by medical oncology, if any: Unknown  Weight changes, if any:  Gained 5 lbs past couple months  Bowel/Bladder complaints, if any: Reported urgency and weak Stream 06/2014 better with Flomax, no hematuria, or nocturia, regular bowel movements,   Nausea/Vomiting, if any: NO  Pain issues, if any:  No  SAFETY ISSUES: NO  Prior radiation? {NO  Pacemaker/ICD? NO  Is the patient on methotrexate? NO  Current Complaints / other details:  Married, Anxiety/depression,Cath stent hx colostomy temporary ,colon surgery,abdomin surgery, Dialysis for ESRD and is on the transplant list,wife does this at home for the patient Large ventral hernia \\Allergies : NKA I=PSS score=7 BP 135/73 mmHg  Pulse 79  Temp(Src) 98.6 F (37 C) (Oral)  Resp 20  Ht 5\' 11"  (1.803 m)  Wt 266 lb 9.6 oz (120.929 kg)  BMI 37.20 kg/m2  Wt Readings from Last 3 Encounters:  10/30/14 266 lb 9.6 oz (120.929 kg)  09/03/14 264 lb 3.2 oz (119.84 kg)  07/23/14 261 lb 8 oz (118.616 kg)

## 2014-10-31 DIAGNOSIS — Z79899 Other long term (current) drug therapy: Secondary | ICD-10-CM | POA: Diagnosis not present

## 2014-10-31 DIAGNOSIS — D631 Anemia in chronic kidney disease: Secondary | ICD-10-CM | POA: Diagnosis not present

## 2014-10-31 DIAGNOSIS — N186 End stage renal disease: Secondary | ICD-10-CM | POA: Diagnosis not present

## 2014-11-01 ENCOUNTER — Ambulatory Visit
Admission: RE | Admit: 2014-11-01 | Discharge: 2014-11-01 | Disposition: A | Discharge status: Home / Self Care | Payer: Medicare Other | Source: Ambulatory Visit | Attending: Radiation Oncology | Admitting: Radiation Oncology

## 2014-11-01 DIAGNOSIS — C61 Malignant neoplasm of prostate: Secondary | ICD-10-CM

## 2014-11-01 DIAGNOSIS — Z510 Encounter for antineoplastic radiation therapy: Secondary | ICD-10-CM | POA: Diagnosis not present

## 2014-11-01 NOTE — Progress Notes (Signed)
CC: Dr. Burman Nieves  Complex simulation/treatment planning note:  [name] was taken to the CT simulator.  His pelvis was scanned.  The CT data set was sent to the planning system where contoured his prostate.  His prostate volume is 33.0 mL, a reasonable correlation to 36.1 mL measured by ultrasound at the time of his biopsies.  The prostate was projected over the pubic arch and the arch is open.  He is a candidate for seed implantation.  I prescribing 14,500 cGy utilizing I-125 seeds.  He will be implanted with the Oncentra/Nucletron seed Selectron system.

## 2014-11-02 DIAGNOSIS — D631 Anemia in chronic kidney disease: Secondary | ICD-10-CM | POA: Diagnosis not present

## 2014-11-02 DIAGNOSIS — Z79899 Other long term (current) drug therapy: Secondary | ICD-10-CM | POA: Diagnosis not present

## 2014-11-02 DIAGNOSIS — N186 End stage renal disease: Secondary | ICD-10-CM | POA: Diagnosis not present

## 2014-11-03 DIAGNOSIS — D631 Anemia in chronic kidney disease: Secondary | ICD-10-CM | POA: Diagnosis not present

## 2014-11-03 DIAGNOSIS — N186 End stage renal disease: Secondary | ICD-10-CM | POA: Diagnosis not present

## 2014-11-03 DIAGNOSIS — Z79899 Other long term (current) drug therapy: Secondary | ICD-10-CM | POA: Diagnosis not present

## 2014-11-05 ENCOUNTER — Other Ambulatory Visit: Payer: Self-pay | Admitting: Urology

## 2014-11-05 ENCOUNTER — Telehealth: Payer: Self-pay | Admitting: *Deleted

## 2014-11-05 DIAGNOSIS — N186 End stage renal disease: Secondary | ICD-10-CM | POA: Diagnosis not present

## 2014-11-05 DIAGNOSIS — D631 Anemia in chronic kidney disease: Secondary | ICD-10-CM | POA: Diagnosis not present

## 2014-11-05 DIAGNOSIS — Z79899 Other long term (current) drug therapy: Secondary | ICD-10-CM | POA: Diagnosis not present

## 2014-11-05 NOTE — Telephone Encounter (Signed)
Called patient to inform of implant date, spoke with this patient and he is aware of this date

## 2014-11-07 DIAGNOSIS — N186 End stage renal disease: Secondary | ICD-10-CM | POA: Diagnosis not present

## 2014-11-07 DIAGNOSIS — D631 Anemia in chronic kidney disease: Secondary | ICD-10-CM | POA: Diagnosis not present

## 2014-11-07 DIAGNOSIS — Z79899 Other long term (current) drug therapy: Secondary | ICD-10-CM | POA: Diagnosis not present

## 2014-11-09 DIAGNOSIS — Z79899 Other long term (current) drug therapy: Secondary | ICD-10-CM | POA: Diagnosis not present

## 2014-11-09 DIAGNOSIS — D631 Anemia in chronic kidney disease: Secondary | ICD-10-CM | POA: Diagnosis not present

## 2014-11-09 DIAGNOSIS — N186 End stage renal disease: Secondary | ICD-10-CM | POA: Diagnosis not present

## 2014-11-11 DIAGNOSIS — N186 End stage renal disease: Secondary | ICD-10-CM | POA: Diagnosis not present

## 2014-11-11 DIAGNOSIS — D631 Anemia in chronic kidney disease: Secondary | ICD-10-CM | POA: Diagnosis not present

## 2014-11-11 DIAGNOSIS — Z79899 Other long term (current) drug therapy: Secondary | ICD-10-CM | POA: Diagnosis not present

## 2014-11-12 DIAGNOSIS — Z79899 Other long term (current) drug therapy: Secondary | ICD-10-CM | POA: Diagnosis not present

## 2014-11-12 DIAGNOSIS — N186 End stage renal disease: Secondary | ICD-10-CM | POA: Diagnosis not present

## 2014-11-12 DIAGNOSIS — D631 Anemia in chronic kidney disease: Secondary | ICD-10-CM | POA: Diagnosis not present

## 2014-11-12 DIAGNOSIS — Z992 Dependence on renal dialysis: Secondary | ICD-10-CM | POA: Diagnosis not present

## 2014-11-13 DIAGNOSIS — N186 End stage renal disease: Secondary | ICD-10-CM | POA: Diagnosis not present

## 2014-11-13 DIAGNOSIS — N2581 Secondary hyperparathyroidism of renal origin: Secondary | ICD-10-CM | POA: Diagnosis not present

## 2014-11-13 DIAGNOSIS — D631 Anemia in chronic kidney disease: Secondary | ICD-10-CM | POA: Diagnosis not present

## 2014-11-13 DIAGNOSIS — Z79899 Other long term (current) drug therapy: Secondary | ICD-10-CM | POA: Diagnosis not present

## 2014-11-15 ENCOUNTER — Other Ambulatory Visit: Payer: Self-pay | Admitting: Urology

## 2014-11-15 DIAGNOSIS — Z79899 Other long term (current) drug therapy: Secondary | ICD-10-CM | POA: Diagnosis not present

## 2014-11-15 DIAGNOSIS — N2581 Secondary hyperparathyroidism of renal origin: Secondary | ICD-10-CM | POA: Diagnosis not present

## 2014-11-15 DIAGNOSIS — N186 End stage renal disease: Secondary | ICD-10-CM | POA: Diagnosis not present

## 2014-11-15 DIAGNOSIS — D631 Anemia in chronic kidney disease: Secondary | ICD-10-CM | POA: Diagnosis not present

## 2014-11-17 DIAGNOSIS — Z79899 Other long term (current) drug therapy: Secondary | ICD-10-CM | POA: Diagnosis not present

## 2014-11-17 DIAGNOSIS — N2581 Secondary hyperparathyroidism of renal origin: Secondary | ICD-10-CM | POA: Diagnosis not present

## 2014-11-17 DIAGNOSIS — D631 Anemia in chronic kidney disease: Secondary | ICD-10-CM | POA: Diagnosis not present

## 2014-11-17 DIAGNOSIS — N186 End stage renal disease: Secondary | ICD-10-CM | POA: Diagnosis not present

## 2014-11-19 DIAGNOSIS — Z79899 Other long term (current) drug therapy: Secondary | ICD-10-CM | POA: Diagnosis not present

## 2014-11-19 DIAGNOSIS — N186 End stage renal disease: Secondary | ICD-10-CM | POA: Diagnosis not present

## 2014-11-19 DIAGNOSIS — N2581 Secondary hyperparathyroidism of renal origin: Secondary | ICD-10-CM | POA: Diagnosis not present

## 2014-11-19 DIAGNOSIS — D631 Anemia in chronic kidney disease: Secondary | ICD-10-CM | POA: Diagnosis not present

## 2014-11-21 DIAGNOSIS — N2581 Secondary hyperparathyroidism of renal origin: Secondary | ICD-10-CM | POA: Diagnosis not present

## 2014-11-21 DIAGNOSIS — N186 End stage renal disease: Secondary | ICD-10-CM | POA: Diagnosis not present

## 2014-11-21 DIAGNOSIS — Z79899 Other long term (current) drug therapy: Secondary | ICD-10-CM | POA: Diagnosis not present

## 2014-11-21 DIAGNOSIS — D631 Anemia in chronic kidney disease: Secondary | ICD-10-CM | POA: Diagnosis not present

## 2014-11-23 DIAGNOSIS — Z992 Dependence on renal dialysis: Secondary | ICD-10-CM | POA: Diagnosis not present

## 2014-11-23 DIAGNOSIS — N186 End stage renal disease: Secondary | ICD-10-CM | POA: Diagnosis not present

## 2014-11-23 DIAGNOSIS — N2581 Secondary hyperparathyroidism of renal origin: Secondary | ICD-10-CM | POA: Diagnosis not present

## 2014-11-23 DIAGNOSIS — D631 Anemia in chronic kidney disease: Secondary | ICD-10-CM | POA: Diagnosis not present

## 2014-11-23 DIAGNOSIS — T82858D Stenosis of vascular prosthetic devices, implants and grafts, subsequent encounter: Secondary | ICD-10-CM | POA: Diagnosis not present

## 2014-11-23 DIAGNOSIS — I871 Compression of vein: Secondary | ICD-10-CM | POA: Diagnosis not present

## 2014-11-23 DIAGNOSIS — Z79899 Other long term (current) drug therapy: Secondary | ICD-10-CM | POA: Diagnosis not present

## 2014-11-25 DIAGNOSIS — D631 Anemia in chronic kidney disease: Secondary | ICD-10-CM | POA: Diagnosis not present

## 2014-11-25 DIAGNOSIS — N2581 Secondary hyperparathyroidism of renal origin: Secondary | ICD-10-CM | POA: Diagnosis not present

## 2014-11-25 DIAGNOSIS — N186 End stage renal disease: Secondary | ICD-10-CM | POA: Diagnosis not present

## 2014-11-25 DIAGNOSIS — Z79899 Other long term (current) drug therapy: Secondary | ICD-10-CM | POA: Diagnosis not present

## 2014-11-26 DIAGNOSIS — Z79899 Other long term (current) drug therapy: Secondary | ICD-10-CM | POA: Diagnosis not present

## 2014-11-26 DIAGNOSIS — D631 Anemia in chronic kidney disease: Secondary | ICD-10-CM | POA: Diagnosis not present

## 2014-11-26 DIAGNOSIS — N2581 Secondary hyperparathyroidism of renal origin: Secondary | ICD-10-CM | POA: Diagnosis not present

## 2014-11-26 DIAGNOSIS — N186 End stage renal disease: Secondary | ICD-10-CM | POA: Diagnosis not present

## 2014-11-27 DIAGNOSIS — Z79899 Other long term (current) drug therapy: Secondary | ICD-10-CM | POA: Diagnosis not present

## 2014-11-27 DIAGNOSIS — N2581 Secondary hyperparathyroidism of renal origin: Secondary | ICD-10-CM | POA: Diagnosis not present

## 2014-11-27 DIAGNOSIS — D631 Anemia in chronic kidney disease: Secondary | ICD-10-CM | POA: Diagnosis not present

## 2014-11-27 DIAGNOSIS — N186 End stage renal disease: Secondary | ICD-10-CM | POA: Diagnosis not present

## 2014-11-29 DIAGNOSIS — Z79899 Other long term (current) drug therapy: Secondary | ICD-10-CM | POA: Diagnosis not present

## 2014-11-29 DIAGNOSIS — N186 End stage renal disease: Secondary | ICD-10-CM | POA: Diagnosis not present

## 2014-11-29 DIAGNOSIS — N2581 Secondary hyperparathyroidism of renal origin: Secondary | ICD-10-CM | POA: Diagnosis not present

## 2014-11-29 DIAGNOSIS — D631 Anemia in chronic kidney disease: Secondary | ICD-10-CM | POA: Diagnosis not present

## 2014-12-01 DIAGNOSIS — Z79899 Other long term (current) drug therapy: Secondary | ICD-10-CM | POA: Diagnosis not present

## 2014-12-01 DIAGNOSIS — N186 End stage renal disease: Secondary | ICD-10-CM | POA: Diagnosis not present

## 2014-12-01 DIAGNOSIS — N2581 Secondary hyperparathyroidism of renal origin: Secondary | ICD-10-CM | POA: Diagnosis not present

## 2014-12-01 DIAGNOSIS — D631 Anemia in chronic kidney disease: Secondary | ICD-10-CM | POA: Diagnosis not present

## 2014-12-03 DIAGNOSIS — N186 End stage renal disease: Secondary | ICD-10-CM | POA: Diagnosis not present

## 2014-12-03 DIAGNOSIS — Z79899 Other long term (current) drug therapy: Secondary | ICD-10-CM | POA: Diagnosis not present

## 2014-12-03 DIAGNOSIS — D631 Anemia in chronic kidney disease: Secondary | ICD-10-CM | POA: Diagnosis not present

## 2014-12-03 DIAGNOSIS — N2581 Secondary hyperparathyroidism of renal origin: Secondary | ICD-10-CM | POA: Diagnosis not present

## 2014-12-05 DIAGNOSIS — D631 Anemia in chronic kidney disease: Secondary | ICD-10-CM | POA: Diagnosis not present

## 2014-12-05 DIAGNOSIS — Z79899 Other long term (current) drug therapy: Secondary | ICD-10-CM | POA: Diagnosis not present

## 2014-12-05 DIAGNOSIS — N186 End stage renal disease: Secondary | ICD-10-CM | POA: Diagnosis not present

## 2014-12-05 DIAGNOSIS — N2581 Secondary hyperparathyroidism of renal origin: Secondary | ICD-10-CM | POA: Diagnosis not present

## 2014-12-07 DIAGNOSIS — Z79899 Other long term (current) drug therapy: Secondary | ICD-10-CM | POA: Diagnosis not present

## 2014-12-07 DIAGNOSIS — N186 End stage renal disease: Secondary | ICD-10-CM | POA: Diagnosis not present

## 2014-12-07 DIAGNOSIS — D631 Anemia in chronic kidney disease: Secondary | ICD-10-CM | POA: Diagnosis not present

## 2014-12-07 DIAGNOSIS — N2581 Secondary hyperparathyroidism of renal origin: Secondary | ICD-10-CM | POA: Diagnosis not present

## 2014-12-09 DIAGNOSIS — D631 Anemia in chronic kidney disease: Secondary | ICD-10-CM | POA: Diagnosis not present

## 2014-12-09 DIAGNOSIS — N186 End stage renal disease: Secondary | ICD-10-CM | POA: Diagnosis not present

## 2014-12-09 DIAGNOSIS — Z79899 Other long term (current) drug therapy: Secondary | ICD-10-CM | POA: Diagnosis not present

## 2014-12-09 DIAGNOSIS — N2581 Secondary hyperparathyroidism of renal origin: Secondary | ICD-10-CM | POA: Diagnosis not present

## 2014-12-10 DIAGNOSIS — Z79899 Other long term (current) drug therapy: Secondary | ICD-10-CM | POA: Diagnosis not present

## 2014-12-10 DIAGNOSIS — D631 Anemia in chronic kidney disease: Secondary | ICD-10-CM | POA: Diagnosis not present

## 2014-12-10 DIAGNOSIS — N186 End stage renal disease: Secondary | ICD-10-CM | POA: Diagnosis not present

## 2014-12-10 DIAGNOSIS — N2581 Secondary hyperparathyroidism of renal origin: Secondary | ICD-10-CM | POA: Diagnosis not present

## 2014-12-12 DIAGNOSIS — N2581 Secondary hyperparathyroidism of renal origin: Secondary | ICD-10-CM | POA: Diagnosis not present

## 2014-12-12 DIAGNOSIS — D631 Anemia in chronic kidney disease: Secondary | ICD-10-CM | POA: Diagnosis not present

## 2014-12-12 DIAGNOSIS — N186 End stage renal disease: Secondary | ICD-10-CM | POA: Diagnosis not present

## 2014-12-12 DIAGNOSIS — Z992 Dependence on renal dialysis: Secondary | ICD-10-CM | POA: Diagnosis not present

## 2014-12-12 DIAGNOSIS — Z79899 Other long term (current) drug therapy: Secondary | ICD-10-CM | POA: Diagnosis not present

## 2014-12-13 DIAGNOSIS — E784 Other hyperlipidemia: Secondary | ICD-10-CM | POA: Diagnosis not present

## 2014-12-13 DIAGNOSIS — N186 End stage renal disease: Secondary | ICD-10-CM | POA: Diagnosis not present

## 2014-12-13 DIAGNOSIS — D631 Anemia in chronic kidney disease: Secondary | ICD-10-CM | POA: Diagnosis not present

## 2014-12-13 DIAGNOSIS — Z79899 Other long term (current) drug therapy: Secondary | ICD-10-CM | POA: Diagnosis not present

## 2014-12-14 DIAGNOSIS — D631 Anemia in chronic kidney disease: Secondary | ICD-10-CM | POA: Diagnosis not present

## 2014-12-14 DIAGNOSIS — N186 End stage renal disease: Secondary | ICD-10-CM | POA: Diagnosis not present

## 2014-12-14 DIAGNOSIS — Z79899 Other long term (current) drug therapy: Secondary | ICD-10-CM | POA: Diagnosis not present

## 2014-12-15 DIAGNOSIS — D631 Anemia in chronic kidney disease: Secondary | ICD-10-CM | POA: Diagnosis not present

## 2014-12-15 DIAGNOSIS — Z79899 Other long term (current) drug therapy: Secondary | ICD-10-CM | POA: Diagnosis not present

## 2014-12-15 DIAGNOSIS — N186 End stage renal disease: Secondary | ICD-10-CM | POA: Diagnosis not present

## 2014-12-17 DIAGNOSIS — Z79899 Other long term (current) drug therapy: Secondary | ICD-10-CM | POA: Diagnosis not present

## 2014-12-17 DIAGNOSIS — D631 Anemia in chronic kidney disease: Secondary | ICD-10-CM | POA: Diagnosis not present

## 2014-12-17 DIAGNOSIS — N186 End stage renal disease: Secondary | ICD-10-CM | POA: Diagnosis not present

## 2014-12-19 DIAGNOSIS — N186 End stage renal disease: Secondary | ICD-10-CM | POA: Diagnosis not present

## 2014-12-19 DIAGNOSIS — D631 Anemia in chronic kidney disease: Secondary | ICD-10-CM | POA: Diagnosis not present

## 2014-12-19 DIAGNOSIS — Z79899 Other long term (current) drug therapy: Secondary | ICD-10-CM | POA: Diagnosis not present

## 2014-12-21 DIAGNOSIS — Z79899 Other long term (current) drug therapy: Secondary | ICD-10-CM | POA: Diagnosis not present

## 2014-12-21 DIAGNOSIS — N186 End stage renal disease: Secondary | ICD-10-CM | POA: Diagnosis not present

## 2014-12-21 DIAGNOSIS — D631 Anemia in chronic kidney disease: Secondary | ICD-10-CM | POA: Diagnosis not present

## 2014-12-22 DIAGNOSIS — D631 Anemia in chronic kidney disease: Secondary | ICD-10-CM | POA: Diagnosis not present

## 2014-12-22 DIAGNOSIS — N186 End stage renal disease: Secondary | ICD-10-CM | POA: Diagnosis not present

## 2014-12-22 DIAGNOSIS — Z79899 Other long term (current) drug therapy: Secondary | ICD-10-CM | POA: Diagnosis not present

## 2014-12-24 DIAGNOSIS — D631 Anemia in chronic kidney disease: Secondary | ICD-10-CM | POA: Diagnosis not present

## 2014-12-24 DIAGNOSIS — N186 End stage renal disease: Secondary | ICD-10-CM | POA: Diagnosis not present

## 2014-12-24 DIAGNOSIS — Z79899 Other long term (current) drug therapy: Secondary | ICD-10-CM | POA: Diagnosis not present

## 2014-12-26 ENCOUNTER — Telehealth: Payer: Self-pay | Admitting: *Deleted

## 2014-12-26 DIAGNOSIS — Z79899 Other long term (current) drug therapy: Secondary | ICD-10-CM | POA: Diagnosis not present

## 2014-12-26 DIAGNOSIS — N186 End stage renal disease: Secondary | ICD-10-CM | POA: Diagnosis not present

## 2014-12-26 DIAGNOSIS — D631 Anemia in chronic kidney disease: Secondary | ICD-10-CM | POA: Diagnosis not present

## 2014-12-26 NOTE — Telephone Encounter (Signed)
Called patient to remind of labs for 12-27-14 for prostate implant, spoke with patient and he is aware of this appt.

## 2014-12-27 ENCOUNTER — Ambulatory Visit (HOSPITAL_COMMUNITY)
Admission: RE | Admit: 2014-12-27 | Discharge: 2014-12-27 | Disposition: A | Discharge status: Home / Self Care | Payer: Medicare Other | Source: Ambulatory Visit | Attending: Urology | Admitting: Urology

## 2014-12-27 DIAGNOSIS — C61 Malignant neoplasm of prostate: Secondary | ICD-10-CM | POA: Insufficient documentation

## 2014-12-27 DIAGNOSIS — R918 Other nonspecific abnormal finding of lung field: Secondary | ICD-10-CM | POA: Diagnosis not present

## 2014-12-27 DIAGNOSIS — I252 Old myocardial infarction: Secondary | ICD-10-CM | POA: Diagnosis not present

## 2014-12-27 LAB — CBC
HEMATOCRIT: 30.1 % — AB (ref 39.0–52.0)
Hemoglobin: 9.7 g/dL — ABNORMAL LOW (ref 13.0–17.0)
MCH: 32.9 pg (ref 26.0–34.0)
MCHC: 32.2 g/dL (ref 30.0–36.0)
MCV: 102 fL — AB (ref 78.0–100.0)
PLATELETS: 177 10*3/uL (ref 150–400)
RBC: 2.95 MIL/uL — ABNORMAL LOW (ref 4.22–5.81)
RDW: 15.3 % (ref 11.5–15.5)
WBC: 7.1 10*3/uL (ref 4.0–10.5)

## 2014-12-27 LAB — COMPREHENSIVE METABOLIC PANEL
ALT: 13 U/L — ABNORMAL LOW (ref 17–63)
AST: 14 U/L — ABNORMAL LOW (ref 15–41)
Albumin: 4.2 g/dL (ref 3.5–5.0)
Alkaline Phosphatase: 86 U/L (ref 38–126)
Anion gap: 16 — ABNORMAL HIGH (ref 5–15)
BILIRUBIN TOTAL: 0.8 mg/dL (ref 0.3–1.2)
BUN: 59 mg/dL — AB (ref 6–20)
CHLORIDE: 97 mmol/L — AB (ref 101–111)
CO2: 26 mmol/L (ref 22–32)
CREATININE: 13.07 mg/dL — AB (ref 0.61–1.24)
Calcium: 9.7 mg/dL (ref 8.9–10.3)
GFR, EST AFRICAN AMERICAN: 4 mL/min — AB (ref >60)
GFR, EST NON AFRICAN AMERICAN: 3 mL/min — AB (ref >60)
Glucose, Bld: 95 mg/dL (ref 65–99)
POTASSIUM: 5.1 mmol/L (ref 3.5–5.1)
Sodium: 139 mmol/L (ref 135–145)
TOTAL PROTEIN: 8.1 g/dL (ref 6.5–8.1)

## 2014-12-27 LAB — APTT: aPTT: 32 seconds (ref 24–37)

## 2014-12-27 LAB — PROTIME-INR
INR: 1.06 (ref 0.00–1.49)
PROTHROMBIN TIME: 14 s (ref 11.6–15.2)

## 2014-12-28 DIAGNOSIS — Z79899 Other long term (current) drug therapy: Secondary | ICD-10-CM | POA: Diagnosis not present

## 2014-12-28 DIAGNOSIS — D631 Anemia in chronic kidney disease: Secondary | ICD-10-CM | POA: Diagnosis not present

## 2014-12-28 DIAGNOSIS — N186 End stage renal disease: Secondary | ICD-10-CM | POA: Diagnosis not present

## 2014-12-29 DIAGNOSIS — Z79899 Other long term (current) drug therapy: Secondary | ICD-10-CM | POA: Diagnosis not present

## 2014-12-29 DIAGNOSIS — N186 End stage renal disease: Secondary | ICD-10-CM | POA: Diagnosis not present

## 2014-12-29 DIAGNOSIS — D631 Anemia in chronic kidney disease: Secondary | ICD-10-CM | POA: Diagnosis not present

## 2014-12-31 ENCOUNTER — Encounter (HOSPITAL_BASED_OUTPATIENT_CLINIC_OR_DEPARTMENT_OTHER): Payer: Self-pay | Admitting: *Deleted

## 2014-12-31 DIAGNOSIS — N186 End stage renal disease: Secondary | ICD-10-CM | POA: Diagnosis not present

## 2014-12-31 DIAGNOSIS — D631 Anemia in chronic kidney disease: Secondary | ICD-10-CM | POA: Diagnosis not present

## 2014-12-31 DIAGNOSIS — Z79899 Other long term (current) drug therapy: Secondary | ICD-10-CM | POA: Diagnosis not present

## 2014-12-31 NOTE — Progress Notes (Signed)
NPO AFTER MN.  ARRIVE AT 0600.  NEEDS ISTAT 8 (PT ON HOME HEMODIALYSIS, THIS WEEK TUES/THURS/SAT).  OTHER CURRENT LAB RESULTS, EKG , AND CXR IN CHART AND EPIC.  WILL TAKE FLOMAX AM DOS W/ SIPS OF WATER AND DO FLEET ENEMA.

## 2015-01-02 DIAGNOSIS — Z79899 Other long term (current) drug therapy: Secondary | ICD-10-CM | POA: Diagnosis not present

## 2015-01-02 DIAGNOSIS — N186 End stage renal disease: Secondary | ICD-10-CM | POA: Diagnosis not present

## 2015-01-02 DIAGNOSIS — D631 Anemia in chronic kidney disease: Secondary | ICD-10-CM | POA: Diagnosis not present

## 2015-01-04 DIAGNOSIS — D631 Anemia in chronic kidney disease: Secondary | ICD-10-CM | POA: Diagnosis not present

## 2015-01-04 DIAGNOSIS — N186 End stage renal disease: Secondary | ICD-10-CM | POA: Diagnosis not present

## 2015-01-04 DIAGNOSIS — Z79899 Other long term (current) drug therapy: Secondary | ICD-10-CM | POA: Diagnosis not present

## 2015-01-05 DIAGNOSIS — N186 End stage renal disease: Secondary | ICD-10-CM | POA: Diagnosis not present

## 2015-01-05 DIAGNOSIS — Z79899 Other long term (current) drug therapy: Secondary | ICD-10-CM | POA: Diagnosis not present

## 2015-01-05 DIAGNOSIS — D631 Anemia in chronic kidney disease: Secondary | ICD-10-CM | POA: Diagnosis not present

## 2015-01-07 DIAGNOSIS — D631 Anemia in chronic kidney disease: Secondary | ICD-10-CM | POA: Diagnosis not present

## 2015-01-07 DIAGNOSIS — N186 End stage renal disease: Secondary | ICD-10-CM | POA: Diagnosis not present

## 2015-01-07 DIAGNOSIS — Z79899 Other long term (current) drug therapy: Secondary | ICD-10-CM | POA: Diagnosis not present

## 2015-01-09 DIAGNOSIS — Z79899 Other long term (current) drug therapy: Secondary | ICD-10-CM | POA: Diagnosis not present

## 2015-01-09 DIAGNOSIS — N186 End stage renal disease: Secondary | ICD-10-CM | POA: Diagnosis not present

## 2015-01-09 DIAGNOSIS — D631 Anemia in chronic kidney disease: Secondary | ICD-10-CM | POA: Diagnosis not present

## 2015-01-11 ENCOUNTER — Telehealth: Payer: Self-pay | Admitting: *Deleted

## 2015-01-11 DIAGNOSIS — N186 End stage renal disease: Secondary | ICD-10-CM | POA: Diagnosis not present

## 2015-01-11 DIAGNOSIS — D631 Anemia in chronic kidney disease: Secondary | ICD-10-CM | POA: Diagnosis not present

## 2015-01-11 DIAGNOSIS — Z79899 Other long term (current) drug therapy: Secondary | ICD-10-CM | POA: Diagnosis not present

## 2015-01-11 NOTE — Telephone Encounter (Signed)
Called  Patient to update about implant, lvm for a return call

## 2015-01-12 DIAGNOSIS — D631 Anemia in chronic kidney disease: Secondary | ICD-10-CM | POA: Diagnosis not present

## 2015-01-12 DIAGNOSIS — Z992 Dependence on renal dialysis: Secondary | ICD-10-CM | POA: Diagnosis not present

## 2015-01-12 DIAGNOSIS — Z79899 Other long term (current) drug therapy: Secondary | ICD-10-CM | POA: Diagnosis not present

## 2015-01-12 DIAGNOSIS — N186 End stage renal disease: Secondary | ICD-10-CM | POA: Diagnosis not present

## 2015-01-14 DIAGNOSIS — N186 End stage renal disease: Secondary | ICD-10-CM | POA: Diagnosis not present

## 2015-01-14 DIAGNOSIS — N2581 Secondary hyperparathyroidism of renal origin: Secondary | ICD-10-CM | POA: Diagnosis not present

## 2015-01-14 DIAGNOSIS — D631 Anemia in chronic kidney disease: Secondary | ICD-10-CM | POA: Diagnosis not present

## 2015-01-14 DIAGNOSIS — Z79899 Other long term (current) drug therapy: Secondary | ICD-10-CM | POA: Diagnosis not present

## 2015-01-16 DIAGNOSIS — N2581 Secondary hyperparathyroidism of renal origin: Secondary | ICD-10-CM | POA: Diagnosis not present

## 2015-01-16 DIAGNOSIS — N186 End stage renal disease: Secondary | ICD-10-CM | POA: Diagnosis not present

## 2015-01-16 DIAGNOSIS — Z79899 Other long term (current) drug therapy: Secondary | ICD-10-CM | POA: Diagnosis not present

## 2015-01-16 DIAGNOSIS — D631 Anemia in chronic kidney disease: Secondary | ICD-10-CM | POA: Diagnosis not present

## 2015-01-18 DIAGNOSIS — N186 End stage renal disease: Secondary | ICD-10-CM | POA: Diagnosis not present

## 2015-01-18 DIAGNOSIS — N2581 Secondary hyperparathyroidism of renal origin: Secondary | ICD-10-CM | POA: Diagnosis not present

## 2015-01-18 DIAGNOSIS — D631 Anemia in chronic kidney disease: Secondary | ICD-10-CM | POA: Diagnosis not present

## 2015-01-18 DIAGNOSIS — Z79899 Other long term (current) drug therapy: Secondary | ICD-10-CM | POA: Diagnosis not present

## 2015-01-19 DIAGNOSIS — Z79899 Other long term (current) drug therapy: Secondary | ICD-10-CM | POA: Diagnosis not present

## 2015-01-19 DIAGNOSIS — N2581 Secondary hyperparathyroidism of renal origin: Secondary | ICD-10-CM | POA: Diagnosis not present

## 2015-01-19 DIAGNOSIS — N186 End stage renal disease: Secondary | ICD-10-CM | POA: Diagnosis not present

## 2015-01-19 DIAGNOSIS — D631 Anemia in chronic kidney disease: Secondary | ICD-10-CM | POA: Diagnosis not present

## 2015-01-21 DIAGNOSIS — N186 End stage renal disease: Secondary | ICD-10-CM | POA: Diagnosis not present

## 2015-01-21 DIAGNOSIS — N2581 Secondary hyperparathyroidism of renal origin: Secondary | ICD-10-CM | POA: Diagnosis not present

## 2015-01-21 DIAGNOSIS — Z79899 Other long term (current) drug therapy: Secondary | ICD-10-CM | POA: Diagnosis not present

## 2015-01-21 DIAGNOSIS — D631 Anemia in chronic kidney disease: Secondary | ICD-10-CM | POA: Diagnosis not present

## 2015-01-23 DIAGNOSIS — D631 Anemia in chronic kidney disease: Secondary | ICD-10-CM | POA: Diagnosis not present

## 2015-01-23 DIAGNOSIS — N2581 Secondary hyperparathyroidism of renal origin: Secondary | ICD-10-CM | POA: Diagnosis not present

## 2015-01-23 DIAGNOSIS — N186 End stage renal disease: Secondary | ICD-10-CM | POA: Diagnosis not present

## 2015-01-23 DIAGNOSIS — Z79899 Other long term (current) drug therapy: Secondary | ICD-10-CM | POA: Diagnosis not present

## 2015-01-25 DIAGNOSIS — N186 End stage renal disease: Secondary | ICD-10-CM | POA: Diagnosis not present

## 2015-01-25 DIAGNOSIS — Z79899 Other long term (current) drug therapy: Secondary | ICD-10-CM | POA: Diagnosis not present

## 2015-01-25 DIAGNOSIS — N2581 Secondary hyperparathyroidism of renal origin: Secondary | ICD-10-CM | POA: Diagnosis not present

## 2015-01-25 DIAGNOSIS — D631 Anemia in chronic kidney disease: Secondary | ICD-10-CM | POA: Diagnosis not present

## 2015-01-26 DIAGNOSIS — N2581 Secondary hyperparathyroidism of renal origin: Secondary | ICD-10-CM | POA: Diagnosis not present

## 2015-01-26 DIAGNOSIS — Z79899 Other long term (current) drug therapy: Secondary | ICD-10-CM | POA: Diagnosis not present

## 2015-01-26 DIAGNOSIS — D631 Anemia in chronic kidney disease: Secondary | ICD-10-CM | POA: Diagnosis not present

## 2015-01-26 DIAGNOSIS — N186 End stage renal disease: Secondary | ICD-10-CM | POA: Diagnosis not present

## 2015-01-28 DIAGNOSIS — N2581 Secondary hyperparathyroidism of renal origin: Secondary | ICD-10-CM | POA: Diagnosis not present

## 2015-01-28 DIAGNOSIS — Z79899 Other long term (current) drug therapy: Secondary | ICD-10-CM | POA: Diagnosis not present

## 2015-01-28 DIAGNOSIS — N186 End stage renal disease: Secondary | ICD-10-CM | POA: Diagnosis not present

## 2015-01-28 DIAGNOSIS — D631 Anemia in chronic kidney disease: Secondary | ICD-10-CM | POA: Diagnosis not present

## 2015-01-30 DIAGNOSIS — Z79899 Other long term (current) drug therapy: Secondary | ICD-10-CM | POA: Diagnosis not present

## 2015-01-30 DIAGNOSIS — D631 Anemia in chronic kidney disease: Secondary | ICD-10-CM | POA: Diagnosis not present

## 2015-01-30 DIAGNOSIS — N2581 Secondary hyperparathyroidism of renal origin: Secondary | ICD-10-CM | POA: Diagnosis not present

## 2015-01-30 DIAGNOSIS — N186 End stage renal disease: Secondary | ICD-10-CM | POA: Diagnosis not present

## 2015-02-01 DIAGNOSIS — N2581 Secondary hyperparathyroidism of renal origin: Secondary | ICD-10-CM | POA: Diagnosis not present

## 2015-02-01 DIAGNOSIS — Z79899 Other long term (current) drug therapy: Secondary | ICD-10-CM | POA: Diagnosis not present

## 2015-02-01 DIAGNOSIS — D631 Anemia in chronic kidney disease: Secondary | ICD-10-CM | POA: Diagnosis not present

## 2015-02-01 DIAGNOSIS — N186 End stage renal disease: Secondary | ICD-10-CM | POA: Diagnosis not present

## 2015-02-02 DIAGNOSIS — D631 Anemia in chronic kidney disease: Secondary | ICD-10-CM | POA: Diagnosis not present

## 2015-02-02 DIAGNOSIS — N2581 Secondary hyperparathyroidism of renal origin: Secondary | ICD-10-CM | POA: Diagnosis not present

## 2015-02-02 DIAGNOSIS — N186 End stage renal disease: Secondary | ICD-10-CM | POA: Diagnosis not present

## 2015-02-02 DIAGNOSIS — Z79899 Other long term (current) drug therapy: Secondary | ICD-10-CM | POA: Diagnosis not present

## 2015-02-04 DIAGNOSIS — N186 End stage renal disease: Secondary | ICD-10-CM | POA: Diagnosis not present

## 2015-02-04 DIAGNOSIS — N2581 Secondary hyperparathyroidism of renal origin: Secondary | ICD-10-CM | POA: Diagnosis not present

## 2015-02-04 DIAGNOSIS — Z79899 Other long term (current) drug therapy: Secondary | ICD-10-CM | POA: Diagnosis not present

## 2015-02-04 DIAGNOSIS — D631 Anemia in chronic kidney disease: Secondary | ICD-10-CM | POA: Diagnosis not present

## 2015-02-06 DIAGNOSIS — D631 Anemia in chronic kidney disease: Secondary | ICD-10-CM | POA: Diagnosis not present

## 2015-02-06 DIAGNOSIS — N2581 Secondary hyperparathyroidism of renal origin: Secondary | ICD-10-CM | POA: Diagnosis not present

## 2015-02-06 DIAGNOSIS — Z79899 Other long term (current) drug therapy: Secondary | ICD-10-CM | POA: Diagnosis not present

## 2015-02-06 DIAGNOSIS — N186 End stage renal disease: Secondary | ICD-10-CM | POA: Diagnosis not present

## 2015-02-08 DIAGNOSIS — Z79899 Other long term (current) drug therapy: Secondary | ICD-10-CM | POA: Diagnosis not present

## 2015-02-08 DIAGNOSIS — N2581 Secondary hyperparathyroidism of renal origin: Secondary | ICD-10-CM | POA: Diagnosis not present

## 2015-02-08 DIAGNOSIS — N186 End stage renal disease: Secondary | ICD-10-CM | POA: Diagnosis not present

## 2015-02-08 DIAGNOSIS — D631 Anemia in chronic kidney disease: Secondary | ICD-10-CM | POA: Diagnosis not present

## 2015-02-09 DIAGNOSIS — D631 Anemia in chronic kidney disease: Secondary | ICD-10-CM | POA: Diagnosis not present

## 2015-02-09 DIAGNOSIS — N2581 Secondary hyperparathyroidism of renal origin: Secondary | ICD-10-CM | POA: Diagnosis not present

## 2015-02-09 DIAGNOSIS — Z79899 Other long term (current) drug therapy: Secondary | ICD-10-CM | POA: Diagnosis not present

## 2015-02-09 DIAGNOSIS — N186 End stage renal disease: Secondary | ICD-10-CM | POA: Diagnosis not present

## 2015-02-11 DIAGNOSIS — N2581 Secondary hyperparathyroidism of renal origin: Secondary | ICD-10-CM | POA: Diagnosis not present

## 2015-02-11 DIAGNOSIS — N186 End stage renal disease: Secondary | ICD-10-CM | POA: Diagnosis not present

## 2015-02-11 DIAGNOSIS — D631 Anemia in chronic kidney disease: Secondary | ICD-10-CM | POA: Diagnosis not present

## 2015-02-11 DIAGNOSIS — Z79899 Other long term (current) drug therapy: Secondary | ICD-10-CM | POA: Diagnosis not present

## 2015-02-12 DIAGNOSIS — N186 End stage renal disease: Secondary | ICD-10-CM | POA: Diagnosis not present

## 2015-02-12 DIAGNOSIS — Z992 Dependence on renal dialysis: Secondary | ICD-10-CM | POA: Diagnosis not present

## 2015-02-12 DIAGNOSIS — N041 Nephrotic syndrome with focal and segmental glomerular lesions: Secondary | ICD-10-CM | POA: Diagnosis not present

## 2015-02-13 DIAGNOSIS — N186 End stage renal disease: Secondary | ICD-10-CM | POA: Diagnosis not present

## 2015-02-13 DIAGNOSIS — N2581 Secondary hyperparathyroidism of renal origin: Secondary | ICD-10-CM | POA: Diagnosis not present

## 2015-02-13 DIAGNOSIS — D631 Anemia in chronic kidney disease: Secondary | ICD-10-CM | POA: Diagnosis not present

## 2015-02-13 DIAGNOSIS — R509 Fever, unspecified: Secondary | ICD-10-CM | POA: Diagnosis not present

## 2015-02-13 DIAGNOSIS — Z79899 Other long term (current) drug therapy: Secondary | ICD-10-CM | POA: Diagnosis not present

## 2015-02-13 DIAGNOSIS — Z23 Encounter for immunization: Secondary | ICD-10-CM | POA: Diagnosis not present

## 2015-02-14 ENCOUNTER — Ambulatory Visit: Admission: RE | Admit: 2015-02-14 | Payer: Medicare Other | Source: Ambulatory Visit

## 2015-02-14 DIAGNOSIS — Z79899 Other long term (current) drug therapy: Secondary | ICD-10-CM | POA: Diagnosis not present

## 2015-02-14 DIAGNOSIS — N186 End stage renal disease: Secondary | ICD-10-CM | POA: Diagnosis not present

## 2015-02-14 DIAGNOSIS — Z23 Encounter for immunization: Secondary | ICD-10-CM | POA: Diagnosis not present

## 2015-02-14 DIAGNOSIS — R509 Fever, unspecified: Secondary | ICD-10-CM | POA: Diagnosis not present

## 2015-02-14 DIAGNOSIS — N2581 Secondary hyperparathyroidism of renal origin: Secondary | ICD-10-CM | POA: Diagnosis not present

## 2015-02-14 DIAGNOSIS — D631 Anemia in chronic kidney disease: Secondary | ICD-10-CM | POA: Diagnosis not present

## 2015-02-15 DIAGNOSIS — N2581 Secondary hyperparathyroidism of renal origin: Secondary | ICD-10-CM | POA: Diagnosis not present

## 2015-02-15 DIAGNOSIS — R509 Fever, unspecified: Secondary | ICD-10-CM | POA: Diagnosis not present

## 2015-02-15 DIAGNOSIS — D631 Anemia in chronic kidney disease: Secondary | ICD-10-CM | POA: Diagnosis not present

## 2015-02-15 DIAGNOSIS — Z79899 Other long term (current) drug therapy: Secondary | ICD-10-CM | POA: Diagnosis not present

## 2015-02-15 DIAGNOSIS — N186 End stage renal disease: Secondary | ICD-10-CM | POA: Diagnosis not present

## 2015-02-15 DIAGNOSIS — Z23 Encounter for immunization: Secondary | ICD-10-CM | POA: Diagnosis not present

## 2015-02-16 DIAGNOSIS — Z79899 Other long term (current) drug therapy: Secondary | ICD-10-CM | POA: Diagnosis not present

## 2015-02-16 DIAGNOSIS — R509 Fever, unspecified: Secondary | ICD-10-CM | POA: Diagnosis not present

## 2015-02-16 DIAGNOSIS — D631 Anemia in chronic kidney disease: Secondary | ICD-10-CM | POA: Diagnosis not present

## 2015-02-16 DIAGNOSIS — N186 End stage renal disease: Secondary | ICD-10-CM | POA: Diagnosis not present

## 2015-02-16 DIAGNOSIS — Z23 Encounter for immunization: Secondary | ICD-10-CM | POA: Diagnosis not present

## 2015-02-16 DIAGNOSIS — N2581 Secondary hyperparathyroidism of renal origin: Secondary | ICD-10-CM | POA: Diagnosis not present

## 2015-02-18 DIAGNOSIS — Z23 Encounter for immunization: Secondary | ICD-10-CM | POA: Diagnosis not present

## 2015-02-18 DIAGNOSIS — R509 Fever, unspecified: Secondary | ICD-10-CM | POA: Diagnosis not present

## 2015-02-18 DIAGNOSIS — Z79899 Other long term (current) drug therapy: Secondary | ICD-10-CM | POA: Diagnosis not present

## 2015-02-18 DIAGNOSIS — N2581 Secondary hyperparathyroidism of renal origin: Secondary | ICD-10-CM | POA: Diagnosis not present

## 2015-02-18 DIAGNOSIS — D631 Anemia in chronic kidney disease: Secondary | ICD-10-CM | POA: Diagnosis not present

## 2015-02-18 DIAGNOSIS — N186 End stage renal disease: Secondary | ICD-10-CM | POA: Diagnosis not present

## 2015-02-19 DIAGNOSIS — N186 End stage renal disease: Secondary | ICD-10-CM | POA: Diagnosis not present

## 2015-02-19 DIAGNOSIS — Z23 Encounter for immunization: Secondary | ICD-10-CM | POA: Diagnosis not present

## 2015-02-19 DIAGNOSIS — R509 Fever, unspecified: Secondary | ICD-10-CM | POA: Diagnosis not present

## 2015-02-19 DIAGNOSIS — N2581 Secondary hyperparathyroidism of renal origin: Secondary | ICD-10-CM | POA: Diagnosis not present

## 2015-02-19 DIAGNOSIS — Z79899 Other long term (current) drug therapy: Secondary | ICD-10-CM | POA: Diagnosis not present

## 2015-02-19 DIAGNOSIS — D631 Anemia in chronic kidney disease: Secondary | ICD-10-CM | POA: Diagnosis not present

## 2015-02-20 ENCOUNTER — Telehealth: Payer: Self-pay | Admitting: *Deleted

## 2015-02-20 DIAGNOSIS — R509 Fever, unspecified: Secondary | ICD-10-CM | POA: Diagnosis not present

## 2015-02-20 DIAGNOSIS — Z79899 Other long term (current) drug therapy: Secondary | ICD-10-CM | POA: Diagnosis not present

## 2015-02-20 DIAGNOSIS — D631 Anemia in chronic kidney disease: Secondary | ICD-10-CM | POA: Diagnosis not present

## 2015-02-20 DIAGNOSIS — N2581 Secondary hyperparathyroidism of renal origin: Secondary | ICD-10-CM | POA: Diagnosis not present

## 2015-02-20 DIAGNOSIS — Z23 Encounter for immunization: Secondary | ICD-10-CM | POA: Diagnosis not present

## 2015-02-20 DIAGNOSIS — N186 End stage renal disease: Secondary | ICD-10-CM | POA: Diagnosis not present

## 2015-02-20 NOTE — Telephone Encounter (Signed)
CALLED PATIENT TO REMIND OF BLOOD WORK FOR 02-21-15 FOR IMPLANT ON 02-28-15, SPOKE WITH PATIENT AND HE IS AWARE OF THIS LAB.

## 2015-02-21 DIAGNOSIS — D649 Anemia, unspecified: Secondary | ICD-10-CM | POA: Diagnosis not present

## 2015-02-21 DIAGNOSIS — K439 Ventral hernia without obstruction or gangrene: Secondary | ICD-10-CM | POA: Diagnosis not present

## 2015-02-21 DIAGNOSIS — M1990 Unspecified osteoarthritis, unspecified site: Secondary | ICD-10-CM | POA: Diagnosis not present

## 2015-02-21 DIAGNOSIS — Z6837 Body mass index (BMI) 37.0-37.9, adult: Secondary | ICD-10-CM | POA: Diagnosis not present

## 2015-02-21 DIAGNOSIS — Z7982 Long term (current) use of aspirin: Secondary | ICD-10-CM | POA: Diagnosis not present

## 2015-02-21 DIAGNOSIS — I252 Old myocardial infarction: Secondary | ICD-10-CM | POA: Diagnosis not present

## 2015-02-21 DIAGNOSIS — E669 Obesity, unspecified: Secondary | ICD-10-CM | POA: Diagnosis not present

## 2015-02-21 DIAGNOSIS — Z79899 Other long term (current) drug therapy: Secondary | ICD-10-CM | POA: Diagnosis not present

## 2015-02-21 DIAGNOSIS — N186 End stage renal disease: Secondary | ICD-10-CM | POA: Diagnosis not present

## 2015-02-21 DIAGNOSIS — G4730 Sleep apnea, unspecified: Secondary | ICD-10-CM | POA: Diagnosis not present

## 2015-02-21 DIAGNOSIS — Z992 Dependence on renal dialysis: Secondary | ICD-10-CM | POA: Diagnosis not present

## 2015-02-21 DIAGNOSIS — C61 Malignant neoplasm of prostate: Secondary | ICD-10-CM | POA: Diagnosis not present

## 2015-02-21 DIAGNOSIS — I2510 Atherosclerotic heart disease of native coronary artery without angina pectoris: Secondary | ICD-10-CM | POA: Diagnosis not present

## 2015-02-21 DIAGNOSIS — Z7682 Awaiting organ transplant status: Secondary | ICD-10-CM | POA: Diagnosis not present

## 2015-02-21 DIAGNOSIS — Z955 Presence of coronary angioplasty implant and graft: Secondary | ICD-10-CM | POA: Diagnosis not present

## 2015-02-21 LAB — COMPREHENSIVE METABOLIC PANEL
ALT: 15 U/L — ABNORMAL LOW (ref 17–63)
ANION GAP: 18 — AB (ref 5–15)
AST: 20 U/L (ref 15–41)
Albumin: 4.4 g/dL (ref 3.5–5.0)
Alkaline Phosphatase: 97 U/L (ref 38–126)
BUN: 58 mg/dL — ABNORMAL HIGH (ref 6–20)
CHLORIDE: 94 mmol/L — AB (ref 101–111)
CO2: 25 mmol/L (ref 22–32)
CREATININE: 11.48 mg/dL — AB (ref 0.61–1.24)
Calcium: 9.1 mg/dL (ref 8.9–10.3)
GFR, EST AFRICAN AMERICAN: 5 mL/min — AB (ref >60)
GFR, EST NON AFRICAN AMERICAN: 4 mL/min — AB (ref >60)
Glucose, Bld: 149 mg/dL — ABNORMAL HIGH (ref 65–99)
Potassium: 3.7 mmol/L (ref 3.5–5.1)
SODIUM: 137 mmol/L (ref 135–145)
Total Bilirubin: 0.5 mg/dL (ref 0.3–1.2)
Total Protein: 7.8 g/dL (ref 6.5–8.1)

## 2015-02-21 LAB — APTT: APTT: 33 s (ref 24–37)

## 2015-02-21 LAB — CBC
HCT: 30.2 % — ABNORMAL LOW (ref 39.0–52.0)
Hemoglobin: 9.9 g/dL — ABNORMAL LOW (ref 13.0–17.0)
MCH: 33.6 pg (ref 26.0–34.0)
MCHC: 32.8 g/dL (ref 30.0–36.0)
MCV: 102.4 fL — AB (ref 78.0–100.0)
PLATELETS: 215 10*3/uL (ref 150–400)
RBC: 2.95 MIL/uL — ABNORMAL LOW (ref 4.22–5.81)
RDW: 15.2 % (ref 11.5–15.5)
WBC: 7.8 10*3/uL (ref 4.0–10.5)

## 2015-02-21 LAB — PROTIME-INR
INR: 1.15 (ref 0.00–1.49)
PROTHROMBIN TIME: 14.4 s (ref 11.6–15.2)

## 2015-02-22 ENCOUNTER — Encounter (HOSPITAL_BASED_OUTPATIENT_CLINIC_OR_DEPARTMENT_OTHER): Payer: Self-pay | Admitting: *Deleted

## 2015-02-22 ENCOUNTER — Telehealth: Payer: Self-pay | Admitting: *Deleted

## 2015-02-22 DIAGNOSIS — N2581 Secondary hyperparathyroidism of renal origin: Secondary | ICD-10-CM | POA: Diagnosis not present

## 2015-02-22 DIAGNOSIS — Z79899 Other long term (current) drug therapy: Secondary | ICD-10-CM | POA: Diagnosis not present

## 2015-02-22 DIAGNOSIS — R509 Fever, unspecified: Secondary | ICD-10-CM | POA: Diagnosis not present

## 2015-02-22 DIAGNOSIS — D631 Anemia in chronic kidney disease: Secondary | ICD-10-CM | POA: Diagnosis not present

## 2015-02-22 DIAGNOSIS — N186 End stage renal disease: Secondary | ICD-10-CM | POA: Diagnosis not present

## 2015-02-22 DIAGNOSIS — Z23 Encounter for immunization: Secondary | ICD-10-CM | POA: Diagnosis not present

## 2015-02-22 NOTE — Telephone Encounter (Signed)
-----   Message from Georgiana Shore, RN sent at 02/21/2015  2:45 PM EST ----- Ellyn Hack sent this to me but thinks he should be seen in [place] office as he doesn't live in [place]. Thanks!  Hope this makes sense. He and I had several messages back and forth to clarify.    From: Franciso Bend, MD   Sent: 02/19/2015  4:43 PM    To: Leonie Man, MD  Subject: [facility] Patient                  Hi Waunita Schooner-   I saw Jaivion Denno in the clinic today for his monthly dialysis visit. I told him to see you all. He is having atypical chest pain but more bothersome is the fact that when I saw him 2 weeks ago - he was irregular on exam. Today he is regular. I told him 2 weeks ago to call you all. I don't have an EKG machine in my office but wondering if he would benefit from holter/ziopatch.   Can someone from your office reach out to him to schedule an appt?   Thank you  [facility]   [phone]   The patient sees Dr. Martinique (I did his cath), not me. DOB - 10-13-2050  May be easier to get him in to see APP in [place]?

## 2015-02-22 NOTE — Telephone Encounter (Signed)
ROUTED INFORMATION TO SCHEDULER TO CONTACT PATIENT SET APPOINTMENT TO SEE AN EXTENDER  DR  HARDING requesting.  patient is followed by DR Martinique

## 2015-02-22 NOTE — Progress Notes (Signed)
NPO AFTER MN WITH EXCEPTION CLEAR LIQUIDS UNTIL 0700 (NO CREAM/ MILK PRODUCTS).  ARRIVE AT 1130.  NEEDS ISTAT 8 (PT DOES HOME DIALYSIS, T/TH/SA).  OTHER LAB RESULTS, EKG, AND CXR IN CHART AND EPIC.  WILL TAKE FLOMAX AM DOS W/ SIPS OF WATER AND DO FLEET ENEMA.

## 2015-02-23 DIAGNOSIS — N186 End stage renal disease: Secondary | ICD-10-CM | POA: Diagnosis not present

## 2015-02-23 DIAGNOSIS — Z23 Encounter for immunization: Secondary | ICD-10-CM | POA: Diagnosis not present

## 2015-02-23 DIAGNOSIS — Z79899 Other long term (current) drug therapy: Secondary | ICD-10-CM | POA: Diagnosis not present

## 2015-02-23 DIAGNOSIS — R509 Fever, unspecified: Secondary | ICD-10-CM | POA: Diagnosis not present

## 2015-02-23 DIAGNOSIS — N2581 Secondary hyperparathyroidism of renal origin: Secondary | ICD-10-CM | POA: Diagnosis not present

## 2015-02-23 DIAGNOSIS — D631 Anemia in chronic kidney disease: Secondary | ICD-10-CM | POA: Diagnosis not present

## 2015-02-25 DIAGNOSIS — Z79899 Other long term (current) drug therapy: Secondary | ICD-10-CM | POA: Diagnosis not present

## 2015-02-25 DIAGNOSIS — N2581 Secondary hyperparathyroidism of renal origin: Secondary | ICD-10-CM | POA: Diagnosis not present

## 2015-02-25 DIAGNOSIS — R509 Fever, unspecified: Secondary | ICD-10-CM | POA: Diagnosis not present

## 2015-02-25 DIAGNOSIS — D631 Anemia in chronic kidney disease: Secondary | ICD-10-CM | POA: Diagnosis not present

## 2015-02-25 DIAGNOSIS — N186 End stage renal disease: Secondary | ICD-10-CM | POA: Diagnosis not present

## 2015-02-25 DIAGNOSIS — Z23 Encounter for immunization: Secondary | ICD-10-CM | POA: Diagnosis not present

## 2015-02-26 DIAGNOSIS — E213 Hyperparathyroidism, unspecified: Secondary | ICD-10-CM | POA: Diagnosis not present

## 2015-02-26 DIAGNOSIS — C61 Malignant neoplasm of prostate: Secondary | ICD-10-CM | POA: Diagnosis not present

## 2015-02-27 ENCOUNTER — Telehealth: Payer: Self-pay | Admitting: *Deleted

## 2015-02-27 DIAGNOSIS — R509 Fever, unspecified: Secondary | ICD-10-CM | POA: Diagnosis not present

## 2015-02-27 DIAGNOSIS — N2581 Secondary hyperparathyroidism of renal origin: Secondary | ICD-10-CM | POA: Diagnosis not present

## 2015-02-27 DIAGNOSIS — Z79899 Other long term (current) drug therapy: Secondary | ICD-10-CM | POA: Diagnosis not present

## 2015-02-27 DIAGNOSIS — N186 End stage renal disease: Secondary | ICD-10-CM | POA: Diagnosis not present

## 2015-02-27 DIAGNOSIS — D631 Anemia in chronic kidney disease: Secondary | ICD-10-CM | POA: Diagnosis not present

## 2015-02-27 DIAGNOSIS — Z23 Encounter for immunization: Secondary | ICD-10-CM | POA: Diagnosis not present

## 2015-02-27 NOTE — Telephone Encounter (Signed)
CALLED PATIENT TO REMIND OF PROCEDURE FOR 02-28-15, LVM FOR A RETURN CALL

## 2015-02-28 ENCOUNTER — Ambulatory Visit (HOSPITAL_COMMUNITY): Payer: Medicare Other

## 2015-02-28 ENCOUNTER — Encounter (HOSPITAL_BASED_OUTPATIENT_CLINIC_OR_DEPARTMENT_OTHER): Payer: Self-pay | Admitting: *Deleted

## 2015-02-28 ENCOUNTER — Ambulatory Visit (HOSPITAL_BASED_OUTPATIENT_CLINIC_OR_DEPARTMENT_OTHER): Payer: Medicare Other | Admitting: Anesthesiology

## 2015-02-28 ENCOUNTER — Encounter (HOSPITAL_BASED_OUTPATIENT_CLINIC_OR_DEPARTMENT_OTHER)
Admission: RE | Disposition: A | Discharge status: Home / Self Care | Payer: Self-pay | Source: Ambulatory Visit | Attending: Urology

## 2015-02-28 ENCOUNTER — Ambulatory Visit (HOSPITAL_BASED_OUTPATIENT_CLINIC_OR_DEPARTMENT_OTHER)
Admission: RE | Admit: 2015-02-28 | Discharge: 2015-02-28 | Disposition: A | Discharge status: Home / Self Care | Payer: Medicare Other | Source: Ambulatory Visit | Attending: Urology | Admitting: Urology

## 2015-02-28 DIAGNOSIS — D638 Anemia in other chronic diseases classified elsewhere: Secondary | ICD-10-CM | POA: Diagnosis not present

## 2015-02-28 DIAGNOSIS — E669 Obesity, unspecified: Secondary | ICD-10-CM | POA: Insufficient documentation

## 2015-02-28 DIAGNOSIS — Z7982 Long term (current) use of aspirin: Secondary | ICD-10-CM | POA: Diagnosis not present

## 2015-02-28 DIAGNOSIS — Z833 Family history of diabetes mellitus: Secondary | ICD-10-CM | POA: Diagnosis not present

## 2015-02-28 DIAGNOSIS — F1010 Alcohol abuse, uncomplicated: Secondary | ICD-10-CM | POA: Diagnosis not present

## 2015-02-28 DIAGNOSIS — Z7682 Awaiting organ transplant status: Secondary | ICD-10-CM | POA: Diagnosis not present

## 2015-02-28 DIAGNOSIS — I10 Essential (primary) hypertension: Secondary | ICD-10-CM | POA: Diagnosis not present

## 2015-02-28 DIAGNOSIS — C61 Malignant neoplasm of prostate: Secondary | ICD-10-CM | POA: Insufficient documentation

## 2015-02-28 DIAGNOSIS — I2510 Atherosclerotic heart disease of native coronary artery without angina pectoris: Secondary | ICD-10-CM | POA: Insufficient documentation

## 2015-02-28 DIAGNOSIS — Z510 Encounter for antineoplastic radiation therapy: Secondary | ICD-10-CM | POA: Diagnosis not present

## 2015-02-28 DIAGNOSIS — M1990 Unspecified osteoarthritis, unspecified site: Secondary | ICD-10-CM | POA: Diagnosis not present

## 2015-02-28 DIAGNOSIS — N186 End stage renal disease: Secondary | ICD-10-CM | POA: Diagnosis not present

## 2015-02-28 DIAGNOSIS — Z809 Family history of malignant neoplasm, unspecified: Secondary | ICD-10-CM | POA: Diagnosis not present

## 2015-02-28 DIAGNOSIS — Z992 Dependence on renal dialysis: Secondary | ICD-10-CM | POA: Diagnosis not present

## 2015-02-28 DIAGNOSIS — G4730 Sleep apnea, unspecified: Secondary | ICD-10-CM | POA: Insufficient documentation

## 2015-02-28 DIAGNOSIS — Z8249 Family history of ischemic heart disease and other diseases of the circulatory system: Secondary | ICD-10-CM | POA: Diagnosis not present

## 2015-02-28 DIAGNOSIS — Z6837 Body mass index (BMI) 37.0-37.9, adult: Secondary | ICD-10-CM | POA: Insufficient documentation

## 2015-02-28 DIAGNOSIS — Z79899 Other long term (current) drug therapy: Secondary | ICD-10-CM | POA: Insufficient documentation

## 2015-02-28 DIAGNOSIS — D696 Thrombocytopenia, unspecified: Secondary | ICD-10-CM | POA: Diagnosis not present

## 2015-02-28 DIAGNOSIS — K439 Ventral hernia without obstruction or gangrene: Secondary | ICD-10-CM | POA: Diagnosis not present

## 2015-02-28 DIAGNOSIS — I252 Old myocardial infarction: Secondary | ICD-10-CM | POA: Diagnosis not present

## 2015-02-28 DIAGNOSIS — Z955 Presence of coronary angioplasty implant and graft: Secondary | ICD-10-CM | POA: Insufficient documentation

## 2015-02-28 DIAGNOSIS — D649 Anemia, unspecified: Secondary | ICD-10-CM | POA: Insufficient documentation

## 2015-02-28 HISTORY — DX: Unspecified symptoms and signs involving the genitourinary system: R39.9

## 2015-02-28 HISTORY — DX: Chronic kidney disease, unspecified: N18.9

## 2015-02-28 HISTORY — DX: Obstructive sleep apnea (adult) (pediatric): G47.33

## 2015-02-28 HISTORY — DX: Awaiting organ transplant status: Z76.82

## 2015-02-28 HISTORY — DX: Presence of coronary angioplasty implant and graft: Z95.5

## 2015-02-28 HISTORY — DX: Benign prostatic hyperplasia with lower urinary tract symptoms: N40.1

## 2015-02-28 HISTORY — PX: CYSTOSCOPY: SHX5120

## 2015-02-28 HISTORY — PX: RADIOACTIVE SEED IMPLANT: SHX5150

## 2015-02-28 HISTORY — DX: Dependence on other enabling machines and devices: Z99.89

## 2015-02-28 HISTORY — DX: Personal history of other (healed) physical injury and trauma: Z87.828

## 2015-02-28 HISTORY — DX: Personal history of other diseases of the circulatory system: Z86.79

## 2015-02-28 HISTORY — DX: Personal history of other infectious and parasitic diseases: Z86.19

## 2015-02-28 HISTORY — DX: Reserved for inherently not codable concepts without codable children: IMO0001

## 2015-02-28 HISTORY — DX: Anemia in chronic kidney disease: D63.1

## 2015-02-28 HISTORY — DX: Dependence on renal dialysis: Z99.2

## 2015-02-28 HISTORY — DX: Secondary hyperparathyroidism of renal origin: N25.81

## 2015-02-28 HISTORY — DX: Old myocardial infarction: I25.2

## 2015-02-28 LAB — POCT I-STAT, CHEM 8
BUN: 42 mg/dL — ABNORMAL HIGH (ref 6–20)
CHLORIDE: 96 mmol/L — AB (ref 101–111)
CREATININE: 10.8 mg/dL — AB (ref 0.61–1.24)
Calcium, Ion: 1.06 mmol/L — ABNORMAL LOW (ref 1.13–1.30)
GLUCOSE: 93 mg/dL (ref 65–99)
HEMATOCRIT: 26 % — AB (ref 39.0–52.0)
HEMOGLOBIN: 8.8 g/dL — AB (ref 13.0–17.0)
POTASSIUM: 3.8 mmol/L (ref 3.5–5.1)
Sodium: 139 mmol/L (ref 135–145)
TCO2: 29 mmol/L (ref 0–100)

## 2015-02-28 SURGERY — INSERTION, RADIATION SOURCE, PROSTATE
Anesthesia: General | Site: Prostate

## 2015-02-28 MED ORDER — STERILE WATER FOR IRRIGATION IR SOLN
Status: DC | PRN
  Administered 2015-02-28: 3000 mL

## 2015-02-28 MED ORDER — ALBUMIN HUMAN 5 % IV SOLN
INTRAVENOUS | Status: AC
  Filled 2015-02-28: qty 250

## 2015-02-28 MED ORDER — OXYCODONE HCL 5 MG PO TABS
5.0000 mg | ORAL_TABLET | Freq: Once | ORAL | Status: AC
  Administered 2015-02-28: 5 mg via ORAL
  Filled 2015-02-28: qty 1

## 2015-02-28 MED ORDER — DEXAMETHASONE SODIUM PHOSPHATE 10 MG/ML IJ SOLN
INTRAMUSCULAR | Status: AC
  Filled 2015-02-28: qty 1

## 2015-02-28 MED ORDER — ONDANSETRON HCL 4 MG/2ML IJ SOLN
INTRAMUSCULAR | Status: AC
  Filled 2015-02-28: qty 2

## 2015-02-28 MED ORDER — PROPOFOL 10 MG/ML IV BOLUS
INTRAVENOUS | Status: DC | PRN
  Administered 2015-02-28 (×2): 50 mg via INTRAVENOUS
  Administered 2015-02-28: 100 mg via INTRAVENOUS

## 2015-02-28 MED ORDER — NEOSTIGMINE METHYLSULFATE 10 MG/10ML IV SOLN
INTRAVENOUS | Status: DC | PRN
  Administered 2015-02-28: 2 mg via INTRAVENOUS

## 2015-02-28 MED ORDER — MIDAZOLAM HCL 2 MG/2ML IJ SOLN
INTRAMUSCULAR | Status: AC
  Filled 2015-02-28: qty 2

## 2015-02-28 MED ORDER — SODIUM CHLORIDE 0.9 % IV SOLN
INTRAVENOUS | Status: DC
  Administered 2015-02-28: 12:00:00 via INTRAVENOUS
  Filled 2015-02-28: qty 1000

## 2015-02-28 MED ORDER — ROCURONIUM BROMIDE 100 MG/10ML IV SOLN
INTRAVENOUS | Status: DC | PRN
  Administered 2015-02-28: 50 mg via INTRAVENOUS

## 2015-02-28 MED ORDER — ROCURONIUM BROMIDE 100 MG/10ML IV SOLN
INTRAVENOUS | Status: AC
  Filled 2015-02-28: qty 1

## 2015-02-28 MED ORDER — SUGAMMADEX SODIUM 200 MG/2ML IV SOLN
INTRAVENOUS | Status: AC
  Filled 2015-02-28: qty 4

## 2015-02-28 MED ORDER — IOHEXOL 350 MG/ML SOLN
INTRAVENOUS | Status: DC | PRN
  Administered 2015-02-28: 7 mL

## 2015-02-28 MED ORDER — ARTIFICIAL TEARS OP OINT
TOPICAL_OINTMENT | OPHTHALMIC | Status: AC
  Filled 2015-02-28: qty 14

## 2015-02-28 MED ORDER — TAMSULOSIN HCL 0.4 MG PO CAPS: 0.4000 mg | ORAL_CAPSULE | Freq: Every day | ORAL | Status: DC

## 2015-02-28 MED ORDER — FENTANYL CITRATE (PF) 100 MCG/2ML IJ SOLN
INTRAMUSCULAR | Status: DC | PRN
  Administered 2015-02-28: 50 ug via INTRAVENOUS

## 2015-02-28 MED ORDER — ALBUMIN HUMAN 5 % IV SOLN
INTRAVENOUS | Status: DC | PRN
  Administered 2015-02-28: 14:00:00 via INTRAVENOUS

## 2015-02-28 MED ORDER — CIPROFLOXACIN IN D5W 400 MG/200ML IV SOLN
INTRAVENOUS | Status: AC
  Filled 2015-02-28: qty 200

## 2015-02-28 MED ORDER — PROPOFOL 10 MG/ML IV BOLUS
INTRAVENOUS | Status: AC
  Filled 2015-02-28: qty 40

## 2015-02-28 MED ORDER — FENTANYL CITRATE (PF) 100 MCG/2ML IJ SOLN
25.0000 ug | INTRAMUSCULAR | Status: DC | PRN
  Filled 2015-02-28: qty 1

## 2015-02-28 MED ORDER — PHENYLEPHRINE HCL 10 MG/ML IJ SOLN
10.0000 mg | INTRAVENOUS | Status: DC | PRN
  Administered 2015-02-28: 50 ug/min via INTRAVENOUS

## 2015-02-28 MED ORDER — GLYCOPYRROLATE 0.2 MG/ML IJ SOLN
INTRAMUSCULAR | Status: AC
  Filled 2015-02-28: qty 1

## 2015-02-28 MED ORDER — ONDANSETRON HCL 4 MG/2ML IJ SOLN
INTRAMUSCULAR | Status: DC | PRN
  Administered 2015-02-28: 4 mg via INTRAVENOUS

## 2015-02-28 MED ORDER — CIPROFLOXACIN HCL 500 MG PO TABS: 500.0000 mg | ORAL_TABLET | Freq: Every day | ORAL | Status: DC

## 2015-02-28 MED ORDER — CIPROFLOXACIN IN D5W 400 MG/200ML IV SOLN
400.0000 mg | INTRAVENOUS | Status: AC
  Administered 2015-02-28: 400 mg via INTRAVENOUS
  Filled 2015-02-28: qty 200

## 2015-02-28 MED ORDER — PHENYLEPHRINE HCL 10 MG/ML IJ SOLN
INTRAMUSCULAR | Status: AC
  Filled 2015-02-28: qty 1

## 2015-02-28 MED ORDER — NEOSTIGMINE METHYLSULFATE 10 MG/10ML IV SOLN
INTRAVENOUS | Status: AC
  Filled 2015-02-28: qty 1

## 2015-02-28 MED ORDER — GLYCOPYRROLATE 0.2 MG/ML IJ SOLN
INTRAMUSCULAR | Status: DC | PRN
  Administered 2015-02-28: 0.3 mg via INTRAVENOUS

## 2015-02-28 MED ORDER — LIDOCAINE HCL (CARDIAC) 20 MG/ML IV SOLN
INTRAVENOUS | Status: DC | PRN
  Administered 2015-02-28: 100 mg via INTRATRACHEAL

## 2015-02-28 MED ORDER — FENTANYL CITRATE (PF) 100 MCG/2ML IJ SOLN
INTRAMUSCULAR | Status: AC
  Filled 2015-02-28: qty 4

## 2015-02-28 MED ORDER — OXYCODONE HCL 5 MG PO TABS
ORAL_TABLET | ORAL | Status: AC
  Filled 2015-02-28: qty 1

## 2015-02-28 MED ORDER — PHENYLEPHRINE HCL 10 MG/ML IJ SOLN
INTRAMUSCULAR | Status: AC
  Filled 2015-02-28: qty 2

## 2015-02-28 MED ORDER — ONDANSETRON HCL 4 MG/2ML IJ SOLN
4.0000 mg | Freq: Once | INTRAMUSCULAR | Status: DC | PRN
  Filled 2015-02-28: qty 2

## 2015-02-28 MED ORDER — GLYCOPYRROLATE 0.2 MG/ML IJ SOLN
INTRAMUSCULAR | Status: AC
  Filled 2015-02-28: qty 3

## 2015-02-28 MED ORDER — TRAMADOL HCL 50 MG PO TABS: 50.0000 mg | ORAL_TABLET | Freq: Four times a day (QID) | ORAL | Status: DC | PRN

## 2015-02-28 MED ORDER — MIDAZOLAM HCL 5 MG/5ML IJ SOLN
INTRAMUSCULAR | Status: DC | PRN
  Administered 2015-02-28: 1 mg via INTRAVENOUS

## 2015-02-28 MED ORDER — LIDOCAINE HCL (CARDIAC) 20 MG/ML IV SOLN
INTRAVENOUS | Status: AC
  Filled 2015-02-28: qty 5

## 2015-02-28 MED ORDER — DEXAMETHASONE SODIUM PHOSPHATE 10 MG/ML IJ SOLN
INTRAMUSCULAR | Status: AC
Start: 2015-02-28 — End: 2015-02-28
  Filled 2015-02-28: qty 1

## 2015-02-28 MED ORDER — FLEET ENEMA 7-19 GM/118ML RE ENEM
1.0000 | ENEMA | Freq: Once | RECTAL | Status: DC
  Filled 2015-02-28: qty 1

## 2015-02-28 SURGICAL SUPPLY — 37 items
BAG URINE DRAINAGE (UROLOGICAL SUPPLIES) ×4 IMPLANT
BLADE CLIPPER SURG (BLADE) ×4 IMPLANT
CATH FOLEY 2WAY SLVR  5CC 16FR (CATHETERS) ×2
CATH FOLEY 2WAY SLVR 5CC 16FR (CATHETERS) ×2 IMPLANT
CATH ROBINSON RED A/P 16FR (CATHETERS) ×2 IMPLANT
CATH ROBINSON RED A/P 20FR (CATHETERS) ×4 IMPLANT
CLOTH BEACON ORANGE TIMEOUT ST (SAFETY) ×4 IMPLANT
COVER BACK TABLE 60X90IN (DRAPES) ×4 IMPLANT
COVER MAYO STAND STRL (DRAPES) ×4 IMPLANT
DRSG TEGADERM 4X4.75 (GAUZE/BANDAGES/DRESSINGS) ×4 IMPLANT
DRSG TEGADERM 8X12 (GAUZE/BANDAGES/DRESSINGS) ×4 IMPLANT
GAUZE SPONGE 4X4 16PLY XRAY LF (GAUZE/BANDAGES/DRESSINGS) ×2 IMPLANT
GLOVE BIO SURGEON STRL SZ 6.5 (GLOVE) ×2 IMPLANT
GLOVE BIO SURGEON STRL SZ7.5 (GLOVE) ×8 IMPLANT
GLOVE BIO SURGEONS STRL SZ 6.5 (GLOVE) ×1
GLOVE BIOGEL PI IND STRL 7.5 (GLOVE) ×2 IMPLANT
GLOVE BIOGEL PI INDICATOR 7.5 (GLOVE) ×4
GLOVE ECLIPSE 8.0 STRL XLNG CF (GLOVE) ×10 IMPLANT
GLOVE INDICATOR 6.5 STRL GRN (GLOVE) ×2 IMPLANT
GLOVE SURG SS PI 7.5 STRL IVOR (GLOVE) ×4 IMPLANT
GOWN STRL REUS W/ TWL LRG LVL3 (GOWN DISPOSABLE) ×2 IMPLANT
GOWN STRL REUS W/ TWL XL LVL3 (GOWN DISPOSABLE) ×2 IMPLANT
GOWN STRL REUS W/TWL LRG LVL3 (GOWN DISPOSABLE) ×4
GOWN STRL REUS W/TWL XL LVL3 (GOWN DISPOSABLE) ×4
HOLDER FOLEY CATH W/STRAP (MISCELLANEOUS) ×2 IMPLANT
IV NS 1000ML (IV SOLUTION)
IV NS 1000ML BAXH (IV SOLUTION) ×2 IMPLANT
KIT ROOM TURNOVER WOR (KITS) ×4 IMPLANT
MANIFOLD NEPTUNE II (INSTRUMENTS) IMPLANT
PACK CYSTO (CUSTOM PROCEDURE TRAY) ×4 IMPLANT
SELECTSEED 1-125 ×2 IMPLANT
SYRINGE 10CC LL (SYRINGE) ×4 IMPLANT
TUBE CONNECTING 12'X1/4 (SUCTIONS) ×1
TUBE CONNECTING 12X1/4 (SUCTIONS) ×1 IMPLANT
UNDERPAD 30X30 INCONTINENT (UNDERPADS AND DIAPERS) ×8 IMPLANT
WATER STERILE IRR 3000ML UROMA (IV SOLUTION) ×2 IMPLANT
WATER STERILE IRR 500ML POUR (IV SOLUTION) ×4 IMPLANT

## 2015-02-28 NOTE — Progress Notes (Signed)
  Radiation Oncology         [phone] ________________________________  Name: [name] MRN: [id number]  Date: 02/28/2015  DOB: 1950-02-19       Prostate Seed Implant  [id number], DAVID SHANE, PA-C  No ref. provider found  DIAGNOSIS:   65 y.o. man with stage T1c adenocarcinoma prostate  Gleason 7 (3+4) and PSA of 5.17     ICD-9-CM ICD-10-CM   1. Prostate cancer ([place]) 185 C61 DG Chest 2 View     DG Chest 2 View     DG Abd 1 View     DG Abd 1 View     Discharge patient    PROCEDURE: Insertion of radioactive I-125 seeds into the prostate gland.  RADIATION DOSE: 145 Gy, definitive therapy.  TECHNIQUE: [name] was brought to the operating room with the urologist. He was placed in the dorsolithotomy position. He was catheterized and a rectal tube was inserted. The perineum was shaved, prepped and draped. The ultrasound probe was then introduced into the rectum to see the prostate gland.  TREATMENT DEVICE: A needle grid was attached to the ultrasound probe stand and anchor needles were placed.  3D PLANNING: The prostate was imaged in 3D using a sagittal sweep of the prostate probe. These images were transferred to the planning computer. There, the prostate, urethra and rectum were defined on each axial reconstructed image. Then, the software created an optimized 3D plan and a few seed positions were adjusted. The quality of the plan was reviewed using [facility] information for the target and the following two organs at risk:  Urethra and Rectum.  Then the accepted plan was uploaded to the seed Selectron afterloading unit.  PROSTATE VOLUME STUDY:  Using transrectal ultrasound the volume of the prostate was verified to be 39.6 cc.  SPECIAL TREATMENT PROCEDURE/SUPERVISION AND HANDLING: The Nucletron FIRST system was used to place the needles under sagittal guidance. A total of 21 needles were used to deposit 72 seeds in the prostate gland. The individual seed activity was 0.427  mCi.  COMPLEX SIMULATION: At the end of the procedure, an anterior radiograph of the pelvis was obtained to document seed positioning and count. Cystoscopy was performed to check the urethra and bladder.  MICRODOSIMETRY: At the end of the procedure, the patient was emitting 0.7 mR/hr at 1 meter. Accordingly, he was considered safe for hospital discharge.  PLAN: The patient will return to the radiation oncology clinic for post implant CT dosimetry in three weeks.   ________________________________  Sheral Apley Tammi Klippel, M.D.

## 2015-02-28 NOTE — Discharge Instructions (Signed)
DISCHARGE INSTRUCTIONS FOR PROSTATE SEED IMPLANTATION ° °Antibiotics °You may be given a prescription for an antibiotic to take when you arrive home. If so, be sure to take every tablet in the bottle, even if you are feeling better before the prescription is finished. If you begin itching, notice a rash or start to swell on your trunk, arms, legs and/or throat, immediately stop taking the antibiotic and call your Urologist. °Diet °Resume your usual diet when you return home. To keep your bowels moving easily and softly, drink prune, apple and cranberry juice at room temperature. You may also take a stool softener, such as Colace, which is available without prescription at local pharmacies. °Daily activities °? No driving or heavy lifting for at least two days after the implant. °? No bike riding, horseback riding or riding lawn mowers for the first month after the implant. °? Any strenuous physical activity should be approved by your doctor before you resume it. °Sexual relations °You may resume sexual relations two weeks after the procedure. A condom should be used for the first two weeks. Your semen may be dark brown or black; this is normal and is related bleeding that may have occurred during the implant. °Postoperative swelling °Expect swelling and bruising of the scrotum and perineum (the area between the scrotum and anus). Both the swelling and the bruising should resolve in l or 2 weeks. Ice packs and over- the-counter medications such as Tylenol, Advil or Aleve may lessen your discomfort. °Postoperative urination °Most men experience burning on urination and/or urinary frequency. If this becomes bothersome, contact your Urologist.  Medication can be prescribed to relieve these problems.  It is normal to have some blood in your urine for a few days after the implant. °Special instructions related to the seeds °It is unlikely that you will pass an Iodine-125 seed in your urine. The seeds are silver in color  and are about as large as a grain of rice. If you pass a seed, do not handle it with your fingers. Use a spoon to place it in an envelope or jar in place this in base occluded area such as the garage or basement for return to the radiation clinic at your convenience. ° °Contact your doctor for °? Temperature greater than 101 F °? Increasing pain °? Inability to urinate °Follow-up ° You should have follow up with your urologist and radiation oncologist about 3 weeks after the procedure. °General information regarding Iodine seeds °? Iodine-125 is a low energy radioactive material. It is not deeply penetrating and loses energy at short distances. Your prostate will absorb the radiation. Objects that are touched or used by the patient do not become radioactive. °? Body wastes (urine and stool) or body fluids (saliva, tears, semen or blood) are not radioactive. °? The Nuclear Regulatory Commission (NRC) has determined that no radiation precautions are needed for patients undergoing Iodine-125 seed implantation. The NRC states that such patients do not present a risk to the people around them, including young children and pregnant women. However, in keeping with the general principle that radiation exposure should be kept as low reasonably possible, we suggest the following: °? Children and pets should not sit on the patient's lap for the first two (2) weeks after the implant. °? Pregnant (or possibly pregnant) women should avoid prolonged, close contact with the patient for the first two (2) weeks after the implant. °? A distance of three (3) feet is acceptable. °? At a distance of three (3)   with the patient.  Post Anesthesia Home Care Instructions  Activity: Get plenty of rest for the remainder of the day. A responsible adult should stay with you for 24 hours following the procedure.  For the next 24 hours, DO NOT: -Drive a car -Operate machinery -Drink alcoholic beverages -Take  any medication unless instructed by your physician -Make any legal decisions or sign important papers.  Meals: Start with liquid foods such as gelatin or soup. Progress to regular foods as tolerated. Avoid greasy, spicy, heavy foods. If nausea and/or vomiting occur, drink only clear liquids until the nausea and/or vomiting subsides. Call your physician if vomiting continues.  Special Instructions/Symptoms: Your throat may feel dry or sore from the anesthesia or the breathing tube placed in your throat during surgery. If this causes discomfort, gargle with warm salt water. The discomfort should disappear within 24 hours.     
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
## 2015-02-28 NOTE — Anesthesia Postprocedure Evaluation (Signed)
Anesthesia Post Note  Patient: [name]  Procedure(s) Performed: Procedure(s) (LRB): RADIOACTIVE SEED IMPLANT/BRACHYTHERAPY IMPLANT (N/A) CYSTOSCOPY (N/A)  Patient location during evaluation: PACU Anesthesia Type: General Level of consciousness: awake and alert and patient cooperative Pain management: pain level controlled Vital Signs Assessment: post-procedure vital signs reviewed and stable Respiratory status: spontaneous breathing and respiratory function stable Cardiovascular status: stable Anesthetic complications: no    Last Vitals:  Filed Vitals:   02/28/15 1600 02/28/15 1615  BP: 112/52 115/59  Pulse: 63 66  Temp:  36 C  Resp: 13 18    Last Pain: There were no vitals filed for this visit.               [place]

## 2015-02-28 NOTE — Transfer of Care (Signed)
Immediate Anesthesia Transfer of Care Note  Patient: [name]  Procedure(s) Performed: Procedure(s) with comments: RADIOACTIVE SEED IMPLANT/BRACHYTHERAPY IMPLANT (N/A) - 72  SEEDS IMPLANTED CYSTOSCOPY (N/A) - NO SEEDS FOUND IN BLADDER  Patient Location: PACU  Anesthesia Type:General  Level of Consciousness: awake and oriented  Airway & Oxygen Therapy: Patient Spontanous Breathing and Patient connected to nasal cannula oxygen  Post-op Assessment: Report given to RN  Post vital signs: Reviewed and stable  Last Vitals:  Filed Vitals:   02/28/15 1129 02/28/15 1533  BP: 128/63 136/48  Pulse: 73 69  Temp: 36.8 C 35.6 C  Resp: 18 9    Complications: No apparent anesthesia complications

## 2015-02-28 NOTE — Op Note (Signed)
Preoperative diagnosis: Clinical stage TI C adenocarcinoma the prostate  Postoperative diagnosis: Same  Procedure: I-125 prostate seed implantation with Nucletron robotic implanter  Surgeon: Louis Meckel, M.D., Tyler Pita, M.D.  Anesthesia: Gen.  Indications: Patient  was diagnosed with clinical stage TI C prostate cancer. We had extensive discussion with him about treatment options versus. He elected to proceed with seed implantation. He underwent consultation my office as well as with Dr. Arloa Koh. He appeared to understand the advantages disadvantages potential risks of this treatment option. Full informed consent has been obtained. The patient is had preoperative ciprofloxacin. PAS compression boots were placed.  Technique and findings: Patient was brought the operating room where he had successful induction of general anesthesia. He was placed in lithotomy position and prepped and draped in usual manner. Appropriate surgical timeout was performed. Radiation oncology department placed a transrectal ultrasound probe anchoring stand. Foley catheter with contrast in the balloon was inserted without difficulty. Anchoring needles were placed within the prostate. Real-time contouring of the urethra prostate and rectum were performed and the dosing parameters were established. Targeted dose was 145 gray. We then came to the operating suite suite for placement of the needles. A second timeout was performed. All needle passage was done with real-time transrectal ultrasound guidance with the sagittal plane. A total of 21 needles were placed. See implantation itself was done with the robotic implanter. 71 active seeds were implanted. A Foley catheter was removed and flexible cystoscopy failed to show any seeds outside the prostate. The Foley catheter was inserted which drained clear urine. The patient was brought to recovery room in stable condition.

## 2015-02-28 NOTE — Anesthesia Procedure Notes (Signed)
Procedure Name: Intubation Date/Time: 02/28/2015 1:38 PM Performed by: Wanita Chamberlain Pre-anesthesia Checklist: Patient identified, Timeout performed, Emergency Drugs available and Suction available Patient Re-evaluated:Patient Re-evaluated prior to inductionOxygen Delivery Method: Circle system utilized Preoxygenation: Pre-oxygenation with 100% oxygen Intubation Type: IV induction and Cricoid Pressure applied Ventilation: Two handed mask ventilation required Laryngoscope Size: Mac and 4 Grade View: Grade I Tube size: 8.0 mm Number of attempts: 1 Airway Equipment and Method: Stylet and Patient positioned with wedge pillow Placement Confirmation: ETT inserted through vocal cords under direct vision,  positive ETCO2 and breath sounds checked- equal and bilateral Dental Injury: Teeth and Oropharynx as per pre-operative assessment  Difficulty Due To: Difficult Airway- due to large tongue and Difficult Airway- due to reduced neck mobility Comments: Pt with beard. 2- man ventilation needed. Pt placed in good sniffing position prior to induction.

## 2015-03-01 ENCOUNTER — Encounter (HOSPITAL_BASED_OUTPATIENT_CLINIC_OR_DEPARTMENT_OTHER): Payer: Self-pay | Admitting: Urology

## 2015-03-01 DIAGNOSIS — R509 Fever, unspecified: Secondary | ICD-10-CM | POA: Diagnosis not present

## 2015-03-01 DIAGNOSIS — N2581 Secondary hyperparathyroidism of renal origin: Secondary | ICD-10-CM | POA: Diagnosis not present

## 2015-03-01 DIAGNOSIS — N186 End stage renal disease: Secondary | ICD-10-CM | POA: Diagnosis not present

## 2015-03-01 DIAGNOSIS — Z79899 Other long term (current) drug therapy: Secondary | ICD-10-CM | POA: Diagnosis not present

## 2015-03-01 DIAGNOSIS — D631 Anemia in chronic kidney disease: Secondary | ICD-10-CM | POA: Diagnosis not present

## 2015-03-01 DIAGNOSIS — Z23 Encounter for immunization: Secondary | ICD-10-CM | POA: Diagnosis not present

## 2015-03-02 DIAGNOSIS — D631 Anemia in chronic kidney disease: Secondary | ICD-10-CM | POA: Diagnosis not present

## 2015-03-02 DIAGNOSIS — Z79899 Other long term (current) drug therapy: Secondary | ICD-10-CM | POA: Diagnosis not present

## 2015-03-02 DIAGNOSIS — N186 End stage renal disease: Secondary | ICD-10-CM | POA: Diagnosis not present

## 2015-03-02 DIAGNOSIS — Z23 Encounter for immunization: Secondary | ICD-10-CM | POA: Diagnosis not present

## 2015-03-02 DIAGNOSIS — R509 Fever, unspecified: Secondary | ICD-10-CM | POA: Diagnosis not present

## 2015-03-02 DIAGNOSIS — N2581 Secondary hyperparathyroidism of renal origin: Secondary | ICD-10-CM | POA: Diagnosis not present

## 2015-03-04 DIAGNOSIS — Z79899 Other long term (current) drug therapy: Secondary | ICD-10-CM | POA: Diagnosis not present

## 2015-03-04 DIAGNOSIS — D631 Anemia in chronic kidney disease: Secondary | ICD-10-CM | POA: Diagnosis not present

## 2015-03-04 DIAGNOSIS — R509 Fever, unspecified: Secondary | ICD-10-CM | POA: Diagnosis not present

## 2015-03-04 DIAGNOSIS — Z23 Encounter for immunization: Secondary | ICD-10-CM | POA: Diagnosis not present

## 2015-03-04 DIAGNOSIS — N186 End stage renal disease: Secondary | ICD-10-CM | POA: Diagnosis not present

## 2015-03-04 DIAGNOSIS — N2581 Secondary hyperparathyroidism of renal origin: Secondary | ICD-10-CM | POA: Diagnosis not present

## 2015-03-05 DIAGNOSIS — R509 Fever, unspecified: Secondary | ICD-10-CM | POA: Diagnosis not present

## 2015-03-05 DIAGNOSIS — D631 Anemia in chronic kidney disease: Secondary | ICD-10-CM | POA: Diagnosis not present

## 2015-03-05 DIAGNOSIS — N186 End stage renal disease: Secondary | ICD-10-CM | POA: Diagnosis not present

## 2015-03-05 DIAGNOSIS — Z79899 Other long term (current) drug therapy: Secondary | ICD-10-CM | POA: Diagnosis not present

## 2015-03-05 DIAGNOSIS — Z23 Encounter for immunization: Secondary | ICD-10-CM | POA: Diagnosis not present

## 2015-03-05 DIAGNOSIS — N2581 Secondary hyperparathyroidism of renal origin: Secondary | ICD-10-CM | POA: Diagnosis not present

## 2015-03-06 DIAGNOSIS — D631 Anemia in chronic kidney disease: Secondary | ICD-10-CM | POA: Diagnosis not present

## 2015-03-06 DIAGNOSIS — Z79899 Other long term (current) drug therapy: Secondary | ICD-10-CM | POA: Diagnosis not present

## 2015-03-06 DIAGNOSIS — N186 End stage renal disease: Secondary | ICD-10-CM | POA: Diagnosis not present

## 2015-03-06 DIAGNOSIS — N2581 Secondary hyperparathyroidism of renal origin: Secondary | ICD-10-CM | POA: Diagnosis not present

## 2015-03-06 DIAGNOSIS — R509 Fever, unspecified: Secondary | ICD-10-CM | POA: Diagnosis not present

## 2015-03-06 DIAGNOSIS — Z23 Encounter for immunization: Secondary | ICD-10-CM | POA: Diagnosis not present

## 2015-03-07 ENCOUNTER — Ambulatory Visit (INDEPENDENT_AMBULATORY_CARE_PROVIDER_SITE_OTHER): Payer: Medicare Other | Admitting: Medical

## 2015-03-07 ENCOUNTER — Encounter: Payer: Self-pay | Admitting: Medical

## 2015-03-07 VITALS — BP 90/60 | HR 78 | Wt 271.0 lb

## 2015-03-07 DIAGNOSIS — Z992 Dependence on renal dialysis: Secondary | ICD-10-CM | POA: Diagnosis not present

## 2015-03-07 DIAGNOSIS — N186 End stage renal disease: Secondary | ICD-10-CM

## 2015-03-07 DIAGNOSIS — E215 Disorder of parathyroid gland, unspecified: Secondary | ICD-10-CM | POA: Diagnosis not present

## 2015-03-07 DIAGNOSIS — N189 Chronic kidney disease, unspecified: Secondary | ICD-10-CM | POA: Diagnosis not present

## 2015-03-07 DIAGNOSIS — R002 Palpitations: Secondary | ICD-10-CM | POA: Diagnosis not present

## 2015-03-07 DIAGNOSIS — N2581 Secondary hyperparathyroidism of renal origin: Secondary | ICD-10-CM | POA: Diagnosis not present

## 2015-03-07 DIAGNOSIS — D631 Anemia in chronic kidney disease: Secondary | ICD-10-CM

## 2015-03-07 DIAGNOSIS — Z79899 Other long term (current) drug therapy: Secondary | ICD-10-CM | POA: Diagnosis not present

## 2015-03-07 DIAGNOSIS — R509 Fever, unspecified: Secondary | ICD-10-CM | POA: Diagnosis not present

## 2015-03-07 DIAGNOSIS — Z23 Encounter for immunization: Secondary | ICD-10-CM | POA: Diagnosis not present

## 2015-03-07 DIAGNOSIS — I25119 Atherosclerotic heart disease of native coronary artery with unspecified angina pectoris: Secondary | ICD-10-CM | POA: Diagnosis not present
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
## 2015-03-08 DIAGNOSIS — Z79899 Other long term (current) drug therapy: Secondary | ICD-10-CM | POA: Diagnosis not present

## 2015-03-08 DIAGNOSIS — N186 End stage renal disease: Secondary | ICD-10-CM | POA: Diagnosis not present

## 2015-03-08 DIAGNOSIS — N2581 Secondary hyperparathyroidism of renal origin: Secondary | ICD-10-CM | POA: Diagnosis not present

## 2015-03-08 DIAGNOSIS — R509 Fever, unspecified: Secondary | ICD-10-CM | POA: Diagnosis not present

## 2015-03-08 DIAGNOSIS — D631 Anemia in chronic kidney disease: Secondary | ICD-10-CM | POA: Diagnosis not present

## 2015-03-08 DIAGNOSIS — Z23 Encounter for immunization: Secondary | ICD-10-CM | POA: Diagnosis not present

## 2015-03-09 DIAGNOSIS — N2581 Secondary hyperparathyroidism of renal origin: Secondary | ICD-10-CM | POA: Diagnosis not present

## 2015-03-09 DIAGNOSIS — D631 Anemia in chronic kidney disease: Secondary | ICD-10-CM | POA: Diagnosis not present

## 2015-03-09 DIAGNOSIS — N186 End stage renal disease: Secondary | ICD-10-CM | POA: Diagnosis not present

## 2015-03-09 DIAGNOSIS — Z79899 Other long term (current) drug therapy: Secondary | ICD-10-CM | POA: Diagnosis not present

## 2015-03-09 DIAGNOSIS — R509 Fever, unspecified: Secondary | ICD-10-CM | POA: Diagnosis not present

## 2015-03-09 DIAGNOSIS — Z23 Encounter for immunization: Secondary | ICD-10-CM | POA: Diagnosis not present

## 2015-03-11 DIAGNOSIS — N2581 Secondary hyperparathyroidism of renal origin: Secondary | ICD-10-CM | POA: Diagnosis not present

## 2015-03-11 DIAGNOSIS — Z23 Encounter for immunization: Secondary | ICD-10-CM | POA: Diagnosis not present

## 2015-03-11 DIAGNOSIS — D631 Anemia in chronic kidney disease: Secondary | ICD-10-CM | POA: Diagnosis not present

## 2015-03-11 DIAGNOSIS — R509 Fever, unspecified: Secondary | ICD-10-CM | POA: Diagnosis not present

## 2015-03-11 DIAGNOSIS — Z79899 Other long term (current) drug therapy: Secondary | ICD-10-CM | POA: Diagnosis not present

## 2015-03-11 DIAGNOSIS — N186 End stage renal disease: Secondary | ICD-10-CM | POA: Diagnosis not present

## 2015-03-12 ENCOUNTER — Telehealth: Payer: Self-pay | Admitting: Medical

## 2015-03-12 DIAGNOSIS — Z23 Encounter for immunization: Secondary | ICD-10-CM | POA: Diagnosis not present

## 2015-03-12 DIAGNOSIS — D631 Anemia in chronic kidney disease: Secondary | ICD-10-CM | POA: Diagnosis not present

## 2015-03-12 DIAGNOSIS — Z992 Dependence on renal dialysis: Secondary | ICD-10-CM | POA: Diagnosis not present

## 2015-03-12 DIAGNOSIS — R509 Fever, unspecified: Secondary | ICD-10-CM | POA: Diagnosis not present

## 2015-03-12 DIAGNOSIS — N2581 Secondary hyperparathyroidism of renal origin: Secondary | ICD-10-CM | POA: Diagnosis not present

## 2015-03-12 DIAGNOSIS — Z79899 Other long term (current) drug therapy: Secondary | ICD-10-CM | POA: Diagnosis not present

## 2015-03-12 DIAGNOSIS — N186 End stage renal disease: Secondary | ICD-10-CM | POA: Diagnosis not present

## 2015-03-12 NOTE — Telephone Encounter (Signed)
I received labs from kidney doctor.   Has he had any more tachycardia or palpitations?   Given ok EKG findings, no other major changes in labs, his palpitations could be due to cardiac cause or anemia.  Looking back in notes, he is due back for routine cardiology f/u at this time which is what I would recommend in case they want to do Holter monitor.

## 2015-03-12 NOTE — Telephone Encounter (Signed)
Said no he has not that everything has been normal. Pt is aware of recommendations. Is going to schedule appt with Dr Martinique his cardiologist.

## 2015-03-13 DIAGNOSIS — N186 End stage renal disease: Secondary | ICD-10-CM | POA: Diagnosis not present

## 2015-03-13 DIAGNOSIS — Z79899 Other long term (current) drug therapy: Secondary | ICD-10-CM | POA: Diagnosis not present

## 2015-03-13 DIAGNOSIS — D631 Anemia in chronic kidney disease: Secondary | ICD-10-CM | POA: Diagnosis not present

## 2015-03-15 DIAGNOSIS — D631 Anemia in chronic kidney disease: Secondary | ICD-10-CM | POA: Diagnosis not present

## 2015-03-15 DIAGNOSIS — N186 End stage renal disease: Secondary | ICD-10-CM | POA: Diagnosis not present

## 2015-03-15 DIAGNOSIS — Z79899 Other long term (current) drug therapy: Secondary | ICD-10-CM | POA: Diagnosis not present

## 2015-03-16 DIAGNOSIS — Z79899 Other long term (current) drug therapy: Secondary | ICD-10-CM | POA: Diagnosis not present

## 2015-03-16 DIAGNOSIS — D631 Anemia in chronic kidney disease: Secondary | ICD-10-CM | POA: Diagnosis not present

## 2015-03-16 DIAGNOSIS — N186 End stage renal disease: Secondary | ICD-10-CM | POA: Diagnosis not present

## 2015-03-18 DIAGNOSIS — D631 Anemia in chronic kidney disease: Secondary | ICD-10-CM | POA: Diagnosis not present

## 2015-03-18 DIAGNOSIS — Z79899 Other long term (current) drug therapy: Secondary | ICD-10-CM | POA: Diagnosis not present

## 2015-03-18 DIAGNOSIS — N186 End stage renal disease: Secondary | ICD-10-CM | POA: Diagnosis not present

## 2015-03-19 DIAGNOSIS — Z79899 Other long term (current) drug therapy: Secondary | ICD-10-CM | POA: Diagnosis not present

## 2015-03-19 DIAGNOSIS — N186 End stage renal disease: Secondary | ICD-10-CM | POA: Diagnosis not present

## 2015-03-19 DIAGNOSIS — D631 Anemia in chronic kidney disease: Secondary | ICD-10-CM | POA: Diagnosis not present

## 2015-03-20 DIAGNOSIS — D631 Anemia in chronic kidney disease: Secondary | ICD-10-CM | POA: Diagnosis not present

## 2015-03-20 DIAGNOSIS — N186 End stage renal disease: Secondary | ICD-10-CM | POA: Diagnosis not present

## 2015-03-20 DIAGNOSIS — Z79899 Other long term (current) drug therapy: Secondary | ICD-10-CM | POA: Diagnosis not present

## 2015-03-21 ENCOUNTER — Telehealth: Payer: Self-pay | Admitting: *Deleted

## 2015-03-21 DIAGNOSIS — Z79899 Other long term (current) drug therapy: Secondary | ICD-10-CM | POA: Diagnosis not present

## 2015-03-21 DIAGNOSIS — N186 End stage renal disease: Secondary | ICD-10-CM | POA: Diagnosis not present

## 2015-03-21 DIAGNOSIS — D631 Anemia in chronic kidney disease: Secondary | ICD-10-CM | POA: Diagnosis not present

## 2015-03-21 DIAGNOSIS — E784 Other hyperlipidemia: Secondary | ICD-10-CM | POA: Diagnosis not present

## 2015-03-21 NOTE — Telephone Encounter (Signed)
CALLED PATIENT TO REMIND OF APPTS. FOR 03-22-15, SPOKE WITH PATIENT AND HE IS AWARE OF THESE APPTS.

## 2015-03-22 ENCOUNTER — Ambulatory Visit
Admission: RE | Admit: 2015-03-22 | Discharge: 2015-03-22 | Disposition: A | Discharge status: Home / Self Care | Payer: Medicare Other | Source: Ambulatory Visit | Attending: Radiation Oncology | Admitting: Radiation Oncology

## 2015-03-22 ENCOUNTER — Encounter: Payer: Self-pay | Admitting: Radiation Oncology

## 2015-03-22 ENCOUNTER — Ambulatory Visit
Admit: 2015-03-22 | Discharge: 2015-03-22 | Disposition: A | Discharge status: Home / Self Care | Payer: Medicare Other | Attending: Radiation Oncology | Admitting: Radiation Oncology

## 2015-03-22 VITALS — BP 105/54 | HR 81 | Resp 16 | Wt 273.9 lb

## 2015-03-22 DIAGNOSIS — D631 Anemia in chronic kidney disease: Secondary | ICD-10-CM | POA: Diagnosis not present

## 2015-03-22 DIAGNOSIS — Z510 Encounter for antineoplastic radiation therapy: Secondary | ICD-10-CM | POA: Diagnosis not present

## 2015-03-22 DIAGNOSIS — Z79899 Other long term (current) drug therapy: Secondary | ICD-10-CM | POA: Diagnosis not present

## 2015-03-22 DIAGNOSIS — N186 End stage renal disease: Secondary | ICD-10-CM | POA: Diagnosis not present

## 2015-03-22 DIAGNOSIS — C61 Malignant neoplasm of prostate: Secondary | ICD-10-CM | POA: Diagnosis not present

## 2015-03-22 NOTE — Progress Notes (Signed)
Radiation Oncology         [phone] ________________________________  Name: [name] MRN: [id number]  Date: 03/22/2015  DOB: 1950-08-15  Follow-Up Visit Note  CC: Crisoforo Oxford, PA-C  Ardis Hughs, MD  Diagnosis:   65 year-old gentleman with Stage T1c intermediate risk adenocarcinoma prostate     ICD-9-CM ICD-10-CM   1. Malignant neoplasm of prostate (HCC) 185 C61     Interval Since Last Radiation:  1  months  Narrative:  The patient returns today for routine follow-up.  He is complaining of increased urinary frequency and urinary hesitation symptoms. He filled out a questionnaire regarding urinary function today providing and overall IPSS score of 22 characterizing his symptoms as severe.  His pre-implant score was 7. He denies any bowel symptoms.  Weight and vitals stable. Denies pain. Patient complains of difficulty emptying, urgency and weak stream. Reports nocturia x 1. Denies dysuria or hematuria. Denies diarrhea.    ALLERGIES:  is allergic to tramadol.  Meds: Current Outpatient Prescriptions  Medication Sig Dispense Refill  . aspirin EC 81 MG tablet Take 81 mg by mouth daily.    Marland Kitchen atorvastatin (LIPITOR) 20 MG tablet Take 1 tablet (20 mg total) by mouth daily at 6 PM. (Patient taking differently: Take 20 mg by mouth every evening. ) 30 tablet 1  . b complex-vitamin c-folic acid (NEPHRO-VITE) 0.8 MG TABS tablet Take 1 tablet by mouth at bedtime.    . nitroGLYCERIN (NITROSTAT) 0.4 MG SL tablet Place 1 tablet (0.4 mg total) under the tongue every 5 (five) minutes as needed for chest pain. 90 tablet 3  . Omega-3 Fatty Acids (FISH OIL PO) Take 1 capsule by mouth daily.     . SENSIPAR 90 MG tablet Take 1 tablet by mouth every evening.     . sevelamer carbonate (RENVELA) 800 MG tablet Take 2,400 mg by mouth 3 (three) times daily with meals.    . tamsulosin (FLOMAX) 0.4 MG CAPS capsule Take 0.4 mg by mouth daily after breakfast.     No current  facility-administered medications for this encounter.    Physical Findings: The patient is in no acute distress. Patient is alert and oriented.  weight is 273 lb 14.4 oz (124.24 kg). His blood pressure is 105/54 and his pulse is 81. His respiration is 16 and oxygen saturation is 100%. .  No significant changes.  Lab Findings: Lab Results  Component Value Date   WBC 7.8 02/21/2015   HGB 8.8* 02/28/2015   HCT 26.0* 02/28/2015   MCV 102.4* 02/21/2015   PLT 215 02/21/2015    Radiographic Findings:  Patient underwent CT imaging in our clinic for post implant dosimetry. The CT appears to demonstrate an adequate distribution of radioactive seeds throughout the prostate gland. There no seeds in her near the rectum. I suspect the final radiation plan and dosimetry will show appropriate coverage of the prostate gland.   Impression: The patient is recovering from the effects of radiation. His urinary symptoms should gradually improve over the next 4-6 months. We talked about this today. He is encouraged by his improvement already and is otherwise please with his outcome.   Plan: Today, I spent time talking to the patient about his prostate seed implant and resolving urinary symptoms. We also talked about long-term follow-up for prostate cancer following seed implant. He understands that ongoing PSA determinations and digital rectal exams will help perform surveillance to rule out disease recurrence. He understands what to expect with  his PSA measures. Patient was also educated today about some of the long-term effects from radiation including a small risk for rectal bleeding and possibly erectile dysfunction. We talked about some of the general management approaches to these potential complications. However, I did encourage the patient to contact our office or return at any point if he has questions or concerns related to his previous radiation and prostate  cancer.  _____________________________________  Sheral Apley. Tammi Klippel, M.D.   This document serves as a record of services personally performed by Tyler Pita, MD. It was created on his behalf by Arlyce Harman, a trained medical scribe. The creation of this record is based on the scribe's personal observations and the provider's statements to them. This document has been checked and approved by the attending provider.

## 2015-03-22 NOTE — Progress Notes (Signed)
  Radiation Oncology         [phone] ________________________________  Name: [name] MRN: [id number]  Date: 03/22/2015  DOB: [birth year]  COMPLEX SIMULATION NOTE  NARRATIVE:  The patient was brought to the [facility] today following prostate seed implantation approximately one month ago.  Identity was confirmed.  All relevant records and images related to the planned course of therapy were reviewed.  Then, the patient was set-up supine.  CT images were obtained.  The CT images were loaded into the planning software.  Then the prostate and rectum were contoured.  Treatment planning then occurred.  The implanted iodine 125 seeds were identified by the physics staff for projection of radiation distribution  I have requested : 3D Simulation  I have requested a DVH of the following structures: Prostate and rectum.    ________________________________  Sheral Apley Tammi Klippel, M.D.  This document serves as a record of services personally performed by Tyler Pita, MD. It was created on his behalf by Arlyce Harman, a trained medical scribe. The creation of this record is based on the scribe's personal observations and the provider's statements to them. This document has been checked and approved by the attending provider.

## 2015-03-22 NOTE — Progress Notes (Signed)
Weight and vitals stable. Denies pain. Patient complains of difficulty emptying, urgency and weak stream. Reports nocturia x 1. Denies dysuria or hematuria. Denies diarrhea.   BP 105/54 mmHg  Pulse 81  Resp 16  Wt 273 lb 14.4 oz (124.24 kg)  SpO2 100% Wt Readings from Last 3 Encounters:  03/22/15 273 lb 14.4 oz (124.24 kg)  03/07/15 271 lb (122.925 kg)  02/28/15 267 lb (121.11 kg)

## 2015-03-23 DIAGNOSIS — Z79899 Other long term (current) drug therapy: Secondary | ICD-10-CM | POA: Diagnosis not present

## 2015-03-23 DIAGNOSIS — D631 Anemia in chronic kidney disease: Secondary | ICD-10-CM | POA: Diagnosis not present

## 2015-03-23 DIAGNOSIS — N186 End stage renal disease: Secondary | ICD-10-CM | POA: Diagnosis not present

## 2015-03-25 DIAGNOSIS — Z79899 Other long term (current) drug therapy: Secondary | ICD-10-CM | POA: Diagnosis not present

## 2015-03-25 DIAGNOSIS — D631 Anemia in chronic kidney disease: Secondary | ICD-10-CM | POA: Diagnosis not present

## 2015-03-25 DIAGNOSIS — N186 End stage renal disease: Secondary | ICD-10-CM | POA: Diagnosis not present

## 2015-03-27 DIAGNOSIS — D631 Anemia in chronic kidney disease: Secondary | ICD-10-CM | POA: Diagnosis not present

## 2015-03-27 DIAGNOSIS — Z79899 Other long term (current) drug therapy: Secondary | ICD-10-CM | POA: Diagnosis not present

## 2015-03-27 DIAGNOSIS — N186 End stage renal disease: Secondary | ICD-10-CM | POA: Diagnosis not present

## 2015-03-28 DIAGNOSIS — C61 Malignant neoplasm of prostate: Secondary | ICD-10-CM | POA: Diagnosis not present

## 2015-03-28 DIAGNOSIS — Z0000 Encounter for general adult medical examination without abnormal findings: Secondary | ICD-10-CM | POA: Diagnosis not present

## 2015-03-29 DIAGNOSIS — N186 End stage renal disease: Secondary | ICD-10-CM | POA: Diagnosis not present

## 2015-03-29 DIAGNOSIS — D631 Anemia in chronic kidney disease: Secondary | ICD-10-CM | POA: Diagnosis not present

## 2015-03-29 DIAGNOSIS — Z79899 Other long term (current) drug therapy: Secondary | ICD-10-CM | POA: Diagnosis not present

## 2015-03-30 DIAGNOSIS — N186 End stage renal disease: Secondary | ICD-10-CM | POA: Diagnosis not present

## 2015-03-30 DIAGNOSIS — D631 Anemia in chronic kidney disease: Secondary | ICD-10-CM | POA: Diagnosis not present

## 2015-03-30 DIAGNOSIS — Z79899 Other long term (current) drug therapy: Secondary | ICD-10-CM | POA: Diagnosis not present

## 2015-04-01 DIAGNOSIS — D631 Anemia in chronic kidney disease: Secondary | ICD-10-CM | POA: Diagnosis not present

## 2015-04-01 DIAGNOSIS — Z79899 Other long term (current) drug therapy: Secondary | ICD-10-CM | POA: Diagnosis not present

## 2015-04-01 DIAGNOSIS — N186 End stage renal disease: Secondary | ICD-10-CM | POA: Diagnosis not present

## 2015-04-03 ENCOUNTER — Ambulatory Visit: Payer: Medicare Other | Admitting: Cardiology

## 2015-04-03 DIAGNOSIS — N186 End stage renal disease: Secondary | ICD-10-CM | POA: Diagnosis not present

## 2015-04-03 DIAGNOSIS — Z79899 Other long term (current) drug therapy: Secondary | ICD-10-CM | POA: Diagnosis not present

## 2015-04-03 DIAGNOSIS — D631 Anemia in chronic kidney disease: Secondary | ICD-10-CM | POA: Diagnosis not present

## 2015-04-05 DIAGNOSIS — Z79899 Other long term (current) drug therapy: Secondary | ICD-10-CM | POA: Diagnosis not present

## 2015-04-05 DIAGNOSIS — D631 Anemia in chronic kidney disease: Secondary | ICD-10-CM | POA: Diagnosis not present

## 2015-04-05 DIAGNOSIS — N186 End stage renal disease: Secondary | ICD-10-CM | POA: Diagnosis not present

## 2015-04-06 DIAGNOSIS — D631 Anemia in chronic kidney disease: Secondary | ICD-10-CM | POA: Diagnosis not present

## 2015-04-06 DIAGNOSIS — N186 End stage renal disease: Secondary | ICD-10-CM | POA: Diagnosis not present

## 2015-04-06 DIAGNOSIS — Z79899 Other long term (current) drug therapy: Secondary | ICD-10-CM | POA: Diagnosis not present

## 2015-04-08 DIAGNOSIS — N186 End stage renal disease: Secondary | ICD-10-CM | POA: Diagnosis not present

## 2015-04-08 DIAGNOSIS — D631 Anemia in chronic kidney disease: Secondary | ICD-10-CM | POA: Diagnosis not present

## 2015-04-08 DIAGNOSIS — Z79899 Other long term (current) drug therapy: Secondary | ICD-10-CM | POA: Diagnosis not present

## 2015-04-09 ENCOUNTER — Encounter: Payer: Self-pay | Admitting: Physician Assistant

## 2015-04-09 ENCOUNTER — Ambulatory Visit (INDEPENDENT_AMBULATORY_CARE_PROVIDER_SITE_OTHER): Payer: Medicare Other | Admitting: Physician Assistant

## 2015-04-09 VITALS — BP 126/72 | HR 72 | Ht 71.0 in | Wt 270.0 lb

## 2015-04-09 DIAGNOSIS — D631 Anemia in chronic kidney disease: Secondary | ICD-10-CM | POA: Diagnosis not present

## 2015-04-09 DIAGNOSIS — I25119 Atherosclerotic heart disease of native coronary artery with unspecified angina pectoris: Secondary | ICD-10-CM

## 2015-04-09 DIAGNOSIS — Z79899 Other long term (current) drug therapy: Secondary | ICD-10-CM | POA: Diagnosis not present

## 2015-04-09 DIAGNOSIS — R002 Palpitations: Secondary | ICD-10-CM | POA: Diagnosis not present

## 2015-04-09 DIAGNOSIS — N186 End stage renal disease: Secondary | ICD-10-CM | POA: Diagnosis not present

## 2015-04-09 NOTE — Patient Instructions (Signed)
Medication Instructions:  Your physician recommends that you continue on your current medications as directed. Please refer to the Current Medication list given to you today.   Labwork: None ordered  Testing/Procedures: None ordered  Follow-Up: Your physician wants you to follow-up in: 6 months with Dr.Jordan You will receive a reminder letter in the mail two months in advance. If you don't receive a letter, please call our office to schedule the follow-up appointment.   Any Other Special Instructions Will Be Listed Below (If Applicable). Please call the office if your palpitations increase in frequency or duration     If you need a refill on your cardiac medications before your next appointment, please call your pharmacy.
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
## 2015-04-10 DIAGNOSIS — Z79899 Other long term (current) drug therapy: Secondary | ICD-10-CM | POA: Diagnosis not present

## 2015-04-10 DIAGNOSIS — D631 Anemia in chronic kidney disease: Secondary | ICD-10-CM | POA: Diagnosis not present

## 2015-04-10 DIAGNOSIS — N186 End stage renal disease: Secondary | ICD-10-CM | POA: Diagnosis not present

## 2015-04-12 DIAGNOSIS — D631 Anemia in chronic kidney disease: Secondary | ICD-10-CM | POA: Diagnosis not present

## 2015-04-12 DIAGNOSIS — N186 End stage renal disease: Secondary | ICD-10-CM | POA: Diagnosis not present

## 2015-04-12 DIAGNOSIS — Z992 Dependence on renal dialysis: Secondary | ICD-10-CM | POA: Diagnosis not present

## 2015-04-12 DIAGNOSIS — Z79899 Other long term (current) drug therapy: Secondary | ICD-10-CM | POA: Diagnosis not present

## 2015-04-13 DIAGNOSIS — D631 Anemia in chronic kidney disease: Secondary | ICD-10-CM | POA: Diagnosis not present

## 2015-04-13 DIAGNOSIS — N2581 Secondary hyperparathyroidism of renal origin: Secondary | ICD-10-CM | POA: Diagnosis not present

## 2015-04-13 DIAGNOSIS — D509 Iron deficiency anemia, unspecified: Secondary | ICD-10-CM | POA: Diagnosis not present

## 2015-04-13 DIAGNOSIS — N186 End stage renal disease: Secondary | ICD-10-CM | POA: Diagnosis not present

## 2015-04-21 DIAGNOSIS — C61 Malignant neoplasm of prostate: Secondary | ICD-10-CM | POA: Diagnosis not present

## 2015-04-21 NOTE — Progress Notes (Addendum)
  Radiation Oncology         [phone] ________________________________  Name: [name] MRN: [id number]  Date: 03/25/2015  DOB: [birth year]  3D Planning Note   Prostate Brachytherapy Post-Implant Dosimetry  Diagnosis:  65 year-old gentleman with Stage T1c intermediate risk adenocarcinoma prostate with Gleasons 3+4 and PSA of 5.17  Narrative: On a previous date, [name] returned following prostate seed implantation for post implant planning. He underwent CT scan complex simulation to delineate the three-dimensional structures of the pelvis and demonstrate the radiation distribution.  Since that time, the seed localization, and complex isodose planning with dose volume histograms have now been completed.  Results:   Prostate Coverage - The dose of radiation delivered to the 90% or more of the prostate gland (D90) was 88.01% of the prescription dose. This is within 2 5 of our goal of greater than 90%. Rectal Sparing - The volume of rectal tissue receiving the prescription dose or higher was 0.03 cc. This falls under our thresholds tolerance of 1.0 cc.  Impression: The prostate seed implant appears to show adequate target coverage and appropriate rectal sparing.  Plan:  The patient will continue to follow with urology for ongoing PSA determinations. I would anticipate a high likelihood for local tumor control with minimal risk for rectal morbidity.  ________________________________  Sheral Apley Tammi Klippel, M.D.

## 2015-05-02 ENCOUNTER — Encounter: Payer: Self-pay | Admitting: Medical

## 2015-05-06 ENCOUNTER — Encounter: Payer: Self-pay | Admitting: Medical

## 2015-05-09 DIAGNOSIS — R3912 Poor urinary stream: Secondary | ICD-10-CM | POA: Diagnosis not present

## 2015-05-09 DIAGNOSIS — Z0000 Encounter for general adult medical examination without abnormal findings: Secondary | ICD-10-CM | POA: Diagnosis not present

## 2015-05-09 DIAGNOSIS — R3915 Urgency of urination: Secondary | ICD-10-CM | POA: Diagnosis not present

## 2015-05-10 DIAGNOSIS — I871 Compression of vein: Secondary | ICD-10-CM | POA: Diagnosis not present

## 2015-05-10 DIAGNOSIS — T82858D Stenosis of vascular prosthetic devices, implants and grafts, subsequent encounter: Secondary | ICD-10-CM | POA: Diagnosis not present

## 2015-05-10 DIAGNOSIS — N186 End stage renal disease: Secondary | ICD-10-CM | POA: Diagnosis not present

## 2015-05-10 DIAGNOSIS — Z992 Dependence on renal dialysis: Secondary | ICD-10-CM | POA: Diagnosis not present

## 2015-05-12 DIAGNOSIS — Z992 Dependence on renal dialysis: Secondary | ICD-10-CM | POA: Diagnosis not present

## 2015-05-12 DIAGNOSIS — N186 End stage renal disease: Secondary | ICD-10-CM | POA: Diagnosis not present

## 2015-05-13 DIAGNOSIS — N186 End stage renal disease: Secondary | ICD-10-CM | POA: Diagnosis not present

## 2015-05-13 DIAGNOSIS — N2581 Secondary hyperparathyroidism of renal origin: Secondary | ICD-10-CM | POA: Diagnosis not present

## 2015-05-13 DIAGNOSIS — D631 Anemia in chronic kidney disease: Secondary | ICD-10-CM | POA: Diagnosis not present

## 2015-05-13 DIAGNOSIS — Z79899 Other long term (current) drug therapy: Secondary | ICD-10-CM | POA: Diagnosis not present

## 2015-05-14 ENCOUNTER — Ambulatory Visit: Payer: Medicare Other | Admitting: Cardiology

## 2015-05-15 DIAGNOSIS — N186 End stage renal disease: Secondary | ICD-10-CM | POA: Diagnosis not present

## 2015-05-15 DIAGNOSIS — N2581 Secondary hyperparathyroidism of renal origin: Secondary | ICD-10-CM | POA: Diagnosis not present

## 2015-05-15 DIAGNOSIS — Z79899 Other long term (current) drug therapy: Secondary | ICD-10-CM | POA: Diagnosis not present

## 2015-05-15 DIAGNOSIS — D631 Anemia in chronic kidney disease: Secondary | ICD-10-CM | POA: Diagnosis not present

## 2015-05-17 DIAGNOSIS — Z79899 Other long term (current) drug therapy: Secondary | ICD-10-CM | POA: Diagnosis not present

## 2015-05-17 DIAGNOSIS — N2581 Secondary hyperparathyroidism of renal origin: Secondary | ICD-10-CM | POA: Diagnosis not present

## 2015-05-17 DIAGNOSIS — N186 End stage renal disease: Secondary | ICD-10-CM | POA: Diagnosis not present

## 2015-05-17 DIAGNOSIS — D631 Anemia in chronic kidney disease: Secondary | ICD-10-CM | POA: Diagnosis not present

## 2015-05-18 DIAGNOSIS — N186 End stage renal disease: Secondary | ICD-10-CM | POA: Diagnosis not present

## 2015-05-18 DIAGNOSIS — Z79899 Other long term (current) drug therapy: Secondary | ICD-10-CM | POA: Diagnosis not present

## 2015-05-18 DIAGNOSIS — D631 Anemia in chronic kidney disease: Secondary | ICD-10-CM | POA: Diagnosis not present

## 2015-05-18 DIAGNOSIS — N2581 Secondary hyperparathyroidism of renal origin: Secondary | ICD-10-CM | POA: Diagnosis not present

## 2015-05-20 DIAGNOSIS — Z79899 Other long term (current) drug therapy: Secondary | ICD-10-CM | POA: Diagnosis not present

## 2015-05-20 DIAGNOSIS — N2581 Secondary hyperparathyroidism of renal origin: Secondary | ICD-10-CM | POA: Diagnosis not present

## 2015-05-20 DIAGNOSIS — N186 End stage renal disease: Secondary | ICD-10-CM | POA: Diagnosis not present

## 2015-05-20 DIAGNOSIS — D631 Anemia in chronic kidney disease: Secondary | ICD-10-CM | POA: Diagnosis not present

## 2015-05-21 DIAGNOSIS — N2581 Secondary hyperparathyroidism of renal origin: Secondary | ICD-10-CM | POA: Diagnosis not present

## 2015-05-21 DIAGNOSIS — Z79899 Other long term (current) drug therapy: Secondary | ICD-10-CM | POA: Diagnosis not present

## 2015-05-21 DIAGNOSIS — N186 End stage renal disease: Secondary | ICD-10-CM | POA: Diagnosis not present

## 2015-05-21 DIAGNOSIS — D631 Anemia in chronic kidney disease: Secondary | ICD-10-CM | POA: Diagnosis not present

## 2015-05-22 DIAGNOSIS — N186 End stage renal disease: Secondary | ICD-10-CM | POA: Diagnosis not present

## 2015-05-22 DIAGNOSIS — N2581 Secondary hyperparathyroidism of renal origin: Secondary | ICD-10-CM | POA: Diagnosis not present

## 2015-05-22 DIAGNOSIS — D631 Anemia in chronic kidney disease: Secondary | ICD-10-CM | POA: Diagnosis not present

## 2015-05-22 DIAGNOSIS — Z79899 Other long term (current) drug therapy: Secondary | ICD-10-CM | POA: Diagnosis not present

## 2015-05-24 DIAGNOSIS — N2581 Secondary hyperparathyroidism of renal origin: Secondary | ICD-10-CM | POA: Diagnosis not present

## 2015-05-24 DIAGNOSIS — N186 End stage renal disease: Secondary | ICD-10-CM | POA: Diagnosis not present

## 2015-05-24 DIAGNOSIS — Z79899 Other long term (current) drug therapy: Secondary | ICD-10-CM | POA: Diagnosis not present

## 2015-05-24 DIAGNOSIS — D631 Anemia in chronic kidney disease: Secondary | ICD-10-CM | POA: Diagnosis not present

## 2015-05-25 DIAGNOSIS — N2581 Secondary hyperparathyroidism of renal origin: Secondary | ICD-10-CM | POA: Diagnosis not present

## 2015-05-25 DIAGNOSIS — D631 Anemia in chronic kidney disease: Secondary | ICD-10-CM | POA: Diagnosis not present

## 2015-05-25 DIAGNOSIS — N186 End stage renal disease: Secondary | ICD-10-CM | POA: Diagnosis not present

## 2015-05-25 DIAGNOSIS — Z79899 Other long term (current) drug therapy: Secondary | ICD-10-CM | POA: Diagnosis not present

## 2015-05-27 DIAGNOSIS — N186 End stage renal disease: Secondary | ICD-10-CM | POA: Diagnosis not present

## 2015-05-27 DIAGNOSIS — D631 Anemia in chronic kidney disease: Secondary | ICD-10-CM | POA: Diagnosis not present

## 2015-05-27 DIAGNOSIS — Z79899 Other long term (current) drug therapy: Secondary | ICD-10-CM | POA: Diagnosis not present

## 2015-05-27 DIAGNOSIS — N2581 Secondary hyperparathyroidism of renal origin: Secondary | ICD-10-CM | POA: Diagnosis not present

## 2015-05-29 DIAGNOSIS — Z79899 Other long term (current) drug therapy: Secondary | ICD-10-CM | POA: Diagnosis not present

## 2015-05-29 DIAGNOSIS — N2581 Secondary hyperparathyroidism of renal origin: Secondary | ICD-10-CM | POA: Diagnosis not present

## 2015-05-29 DIAGNOSIS — N186 End stage renal disease: Secondary | ICD-10-CM | POA: Diagnosis not present

## 2015-05-29 DIAGNOSIS — D631 Anemia in chronic kidney disease: Secondary | ICD-10-CM | POA: Diagnosis not present

## 2015-05-30 DIAGNOSIS — N2581 Secondary hyperparathyroidism of renal origin: Secondary | ICD-10-CM | POA: Diagnosis not present

## 2015-05-30 DIAGNOSIS — Z79899 Other long term (current) drug therapy: Secondary | ICD-10-CM | POA: Diagnosis not present

## 2015-05-30 DIAGNOSIS — N186 End stage renal disease: Secondary | ICD-10-CM | POA: Diagnosis not present

## 2015-05-30 DIAGNOSIS — D631 Anemia in chronic kidney disease: Secondary | ICD-10-CM | POA: Diagnosis not present

## 2015-05-31 DIAGNOSIS — D631 Anemia in chronic kidney disease: Secondary | ICD-10-CM | POA: Diagnosis not present

## 2015-05-31 DIAGNOSIS — N186 End stage renal disease: Secondary | ICD-10-CM | POA: Diagnosis not present

## 2015-05-31 DIAGNOSIS — N2581 Secondary hyperparathyroidism of renal origin: Secondary | ICD-10-CM | POA: Diagnosis not present

## 2015-05-31 DIAGNOSIS — Z79899 Other long term (current) drug therapy: Secondary | ICD-10-CM | POA: Diagnosis not present

## 2015-06-01 DIAGNOSIS — N2581 Secondary hyperparathyroidism of renal origin: Secondary | ICD-10-CM | POA: Diagnosis not present

## 2015-06-01 DIAGNOSIS — D631 Anemia in chronic kidney disease: Secondary | ICD-10-CM | POA: Diagnosis not present

## 2015-06-01 DIAGNOSIS — Z79899 Other long term (current) drug therapy: Secondary | ICD-10-CM | POA: Diagnosis not present

## 2015-06-01 DIAGNOSIS — N186 End stage renal disease: Secondary | ICD-10-CM | POA: Diagnosis not present

## 2015-06-03 DIAGNOSIS — N2581 Secondary hyperparathyroidism of renal origin: Secondary | ICD-10-CM | POA: Diagnosis not present

## 2015-06-03 DIAGNOSIS — N186 End stage renal disease: Secondary | ICD-10-CM | POA: Diagnosis not present

## 2015-06-03 DIAGNOSIS — D631 Anemia in chronic kidney disease: Secondary | ICD-10-CM | POA: Diagnosis not present

## 2015-06-03 DIAGNOSIS — Z79899 Other long term (current) drug therapy: Secondary | ICD-10-CM | POA: Diagnosis not present

## 2015-06-05 DIAGNOSIS — N186 End stage renal disease: Secondary | ICD-10-CM | POA: Diagnosis not present

## 2015-06-05 DIAGNOSIS — D631 Anemia in chronic kidney disease: Secondary | ICD-10-CM | POA: Diagnosis not present

## 2015-06-05 DIAGNOSIS — N2581 Secondary hyperparathyroidism of renal origin: Secondary | ICD-10-CM | POA: Diagnosis not present

## 2015-06-05 DIAGNOSIS — Z79899 Other long term (current) drug therapy: Secondary | ICD-10-CM | POA: Diagnosis not present

## 2015-06-07 DIAGNOSIS — N186 End stage renal disease: Secondary | ICD-10-CM | POA: Diagnosis not present

## 2015-06-07 DIAGNOSIS — D631 Anemia in chronic kidney disease: Secondary | ICD-10-CM | POA: Diagnosis not present

## 2015-06-07 DIAGNOSIS — Z79899 Other long term (current) drug therapy: Secondary | ICD-10-CM | POA: Diagnosis not present

## 2015-06-07 DIAGNOSIS — N2581 Secondary hyperparathyroidism of renal origin: Secondary | ICD-10-CM | POA: Diagnosis not present

## 2015-06-08 DIAGNOSIS — Z79899 Other long term (current) drug therapy: Secondary | ICD-10-CM | POA: Diagnosis not present

## 2015-06-08 DIAGNOSIS — N186 End stage renal disease: Secondary | ICD-10-CM | POA: Diagnosis not present

## 2015-06-08 DIAGNOSIS — N2581 Secondary hyperparathyroidism of renal origin: Secondary | ICD-10-CM | POA: Diagnosis not present

## 2015-06-08 DIAGNOSIS — D631 Anemia in chronic kidney disease: Secondary | ICD-10-CM | POA: Diagnosis not present

## 2015-06-10 DIAGNOSIS — N186 End stage renal disease: Secondary | ICD-10-CM | POA: Diagnosis not present

## 2015-06-10 DIAGNOSIS — D631 Anemia in chronic kidney disease: Secondary | ICD-10-CM | POA: Diagnosis not present

## 2015-06-10 DIAGNOSIS — Z79899 Other long term (current) drug therapy: Secondary | ICD-10-CM | POA: Diagnosis not present

## 2015-06-10 DIAGNOSIS — N2581 Secondary hyperparathyroidism of renal origin: Secondary | ICD-10-CM | POA: Diagnosis not present

## 2015-06-12 DIAGNOSIS — D631 Anemia in chronic kidney disease: Secondary | ICD-10-CM | POA: Diagnosis not present

## 2015-06-12 DIAGNOSIS — N2581 Secondary hyperparathyroidism of renal origin: Secondary | ICD-10-CM | POA: Diagnosis not present

## 2015-06-12 DIAGNOSIS — Z992 Dependence on renal dialysis: Secondary | ICD-10-CM | POA: Diagnosis not present

## 2015-06-12 DIAGNOSIS — Z79899 Other long term (current) drug therapy: Secondary | ICD-10-CM | POA: Diagnosis not present

## 2015-06-12 DIAGNOSIS — N186 End stage renal disease: Secondary | ICD-10-CM | POA: Diagnosis not present

## 2015-06-14 DIAGNOSIS — D631 Anemia in chronic kidney disease: Secondary | ICD-10-CM | POA: Diagnosis not present

## 2015-06-14 DIAGNOSIS — N186 End stage renal disease: Secondary | ICD-10-CM | POA: Diagnosis not present

## 2015-06-14 DIAGNOSIS — Z79899 Other long term (current) drug therapy: Secondary | ICD-10-CM | POA: Diagnosis not present

## 2015-06-14 DIAGNOSIS — K7689 Other specified diseases of liver: Secondary | ICD-10-CM | POA: Diagnosis not present

## 2015-06-15 DIAGNOSIS — Z79899 Other long term (current) drug therapy: Secondary | ICD-10-CM | POA: Diagnosis not present

## 2015-06-15 DIAGNOSIS — D631 Anemia in chronic kidney disease: Secondary | ICD-10-CM | POA: Diagnosis not present

## 2015-06-15 DIAGNOSIS — K7689 Other specified diseases of liver: Secondary | ICD-10-CM | POA: Diagnosis not present

## 2015-06-15 DIAGNOSIS — N186 End stage renal disease: Secondary | ICD-10-CM | POA: Diagnosis not present

## 2015-06-18 DIAGNOSIS — N186 End stage renal disease: Secondary | ICD-10-CM | POA: Diagnosis not present

## 2015-06-18 DIAGNOSIS — D631 Anemia in chronic kidney disease: Secondary | ICD-10-CM | POA: Diagnosis not present

## 2015-06-18 DIAGNOSIS — E784 Other hyperlipidemia: Secondary | ICD-10-CM | POA: Diagnosis not present

## 2015-06-18 DIAGNOSIS — Z79899 Other long term (current) drug therapy: Secondary | ICD-10-CM | POA: Diagnosis not present

## 2015-06-18 DIAGNOSIS — K7689 Other specified diseases of liver: Secondary | ICD-10-CM | POA: Diagnosis not present

## 2015-07-02 DIAGNOSIS — E213 Hyperparathyroidism, unspecified: Secondary | ICD-10-CM | POA: Diagnosis not present

## 2015-07-12 DIAGNOSIS — N186 End stage renal disease: Secondary | ICD-10-CM | POA: Diagnosis not present

## 2015-07-12 DIAGNOSIS — Z992 Dependence on renal dialysis: Secondary | ICD-10-CM | POA: Diagnosis not present

## 2015-07-17 DIAGNOSIS — N2581 Secondary hyperparathyroidism of renal origin: Secondary | ICD-10-CM | POA: Diagnosis not present

## 2015-07-17 DIAGNOSIS — N186 End stage renal disease: Secondary | ICD-10-CM | POA: Diagnosis not present

## 2015-08-12 DIAGNOSIS — Z992 Dependence on renal dialysis: Secondary | ICD-10-CM | POA: Diagnosis not present

## 2015-08-12 DIAGNOSIS — N186 End stage renal disease: Secondary | ICD-10-CM | POA: Diagnosis not present

## 2015-09-12 DIAGNOSIS — Z992 Dependence on renal dialysis: Secondary | ICD-10-CM | POA: Diagnosis not present

## 2015-09-12 DIAGNOSIS — N186 End stage renal disease: Secondary | ICD-10-CM | POA: Diagnosis not present

## 2015-10-12 DIAGNOSIS — Z992 Dependence on renal dialysis: Secondary | ICD-10-CM | POA: Diagnosis not present

## 2015-10-12 DIAGNOSIS — N186 End stage renal disease: Secondary | ICD-10-CM | POA: Diagnosis not present

## 2015-11-12 DIAGNOSIS — Z992 Dependence on renal dialysis: Secondary | ICD-10-CM | POA: Diagnosis not present

## 2015-11-12 DIAGNOSIS — N186 End stage renal disease: Secondary | ICD-10-CM | POA: Diagnosis not present

## 2015-11-20 ENCOUNTER — Telehealth: Payer: Self-pay

## 2015-11-20 NOTE — Telephone Encounter (Signed)
Rec'd call from pt's Home Dialysis nurse.  Pt. Is being referred by Dr. 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  Stated the skin is intact.  Denied any scab or ulcer over AVF site.  Stated the pt. is c/o increased pain when fistula is accessed.  Advised will contact pt. With an appt.  Nurse stated he does home dialysis 4 days/ week, and that the pt. Can be flexible with his treatments to come to an appt.

## 2015-11-21 NOTE — Telephone Encounter (Signed)
Does this patient need an access lab?

## 2015-11-22 ENCOUNTER — Encounter: Payer: Self-pay | Admitting: Vascular Surgery

## 2015-11-22 ENCOUNTER — Ambulatory Visit (INDEPENDENT_AMBULATORY_CARE_PROVIDER_SITE_OTHER): Payer: Medicare Other | Admitting: Vascular Surgery

## 2015-11-22 VITALS — BP 127/71 | HR 74 | Temp 98.3°F | Resp 18 | Ht 71.0 in | Wt 270.4 lb

## 2015-11-22 DIAGNOSIS — Z992 Dependence on renal dialysis: Secondary | ICD-10-CM

## 2015-11-22 DIAGNOSIS — I25119 Atherosclerotic heart disease of native coronary artery with unspecified angina pectoris: Secondary | ICD-10-CM | POA: Diagnosis not present

## 2015-11-22 DIAGNOSIS — N186 End stage renal disease: Secondary | ICD-10-CM | POA: Diagnosis not present
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
## 2015-12-12 DIAGNOSIS — N186 End stage renal disease: Secondary | ICD-10-CM | POA: Diagnosis not present

## 2015-12-12 DIAGNOSIS — Z992 Dependence on renal dialysis: Secondary | ICD-10-CM | POA: Diagnosis not present

## 2015-12-13 DIAGNOSIS — N186 End stage renal disease: Secondary | ICD-10-CM | POA: Diagnosis not present

## 2015-12-13 DIAGNOSIS — T82858A Stenosis of vascular prosthetic devices, implants and grafts, initial encounter: Secondary | ICD-10-CM | POA: Diagnosis not present

## 2015-12-13 DIAGNOSIS — Z992 Dependence on renal dialysis: Secondary | ICD-10-CM | POA: Diagnosis not present

## 2015-12-13 DIAGNOSIS — I871 Compression of vein: Secondary | ICD-10-CM | POA: Diagnosis not present

## 2015-12-26 ENCOUNTER — Other Ambulatory Visit
Admission: RE | Admit: 2015-12-26 | Discharge: 2015-12-26 | Disposition: A | Discharge status: Home / Self Care | Payer: Medicare Other | Source: Ambulatory Visit | Attending: Nephrology | Admitting: Nephrology

## 2015-12-26 DIAGNOSIS — N2589 Other disorders resulting from impaired renal tubular function: Secondary | ICD-10-CM | POA: Insufficient documentation

## 2015-12-26 DIAGNOSIS — N186 End stage renal disease: Secondary | ICD-10-CM | POA: Diagnosis not present

## 2015-12-26 DIAGNOSIS — D630 Anemia in neoplastic disease: Secondary | ICD-10-CM | POA: Diagnosis not present

## 2015-12-26 LAB — CBC
HCT: 33.8 % — ABNORMAL LOW (ref 40.0–52.0)
HEMOGLOBIN: 11.3 g/dL — AB (ref 13.0–18.0)
MCH: 30.9 pg (ref 26.0–34.0)
MCHC: 33.4 g/dL (ref 32.0–36.0)
MCV: 92.6 fL (ref 80.0–100.0)
Platelets: 214 10*3/uL (ref 150–440)
RBC: 3.65 MIL/uL — AB (ref 4.40–5.90)
RDW: 17.4 % — ABNORMAL HIGH (ref 11.5–14.5)
WBC: 6.5 10*3/uL (ref 3.8–10.6)

## 2015-12-26 LAB — BASIC METABOLIC PANEL
ANION GAP: 13 (ref 5–15)
BUN: 35 mg/dL — AB (ref 6–20)
CHLORIDE: 94 mmol/L — AB (ref 101–111)
CO2: 27 mmol/L (ref 22–32)
Calcium: 8.3 mg/dL — ABNORMAL LOW (ref 8.9–10.3)
Creatinine, Ser: 9.94 mg/dL — ABNORMAL HIGH (ref 0.61–1.24)
GFR calc non Af Amer: 5 mL/min — ABNORMAL LOW (ref >60)
GFR, EST AFRICAN AMERICAN: 6 mL/min — AB (ref >60)
Glucose, Bld: 80 mg/dL (ref 65–99)
POTASSIUM: 3.8 mmol/L (ref 3.5–5.1)
SODIUM: 134 mmol/L — AB (ref 135–145)

## 2015-12-27 ENCOUNTER — Other Ambulatory Visit
Admission: RE | Admit: 2015-12-27 | Discharge: 2015-12-27 | Disposition: A | Discharge status: Home / Self Care | Payer: Medicare Other | Source: Ambulatory Visit | Attending: Nephrology | Admitting: Nephrology

## 2015-12-27 LAB — PARATHYROID HORMONE, INTACT (NO CA): PTH: 504 pg/mL — AB (ref 15–65)

## 2015-12-27 LAB — PHOSPHORUS: PHOSPHORUS: 5.9 mg/dL — AB (ref 2.5–4.6)

## 2016-01-07 DIAGNOSIS — Z8546 Personal history of malignant neoplasm of prostate: Secondary | ICD-10-CM | POA: Diagnosis not present

## 2016-01-12 DIAGNOSIS — Z992 Dependence on renal dialysis: Secondary | ICD-10-CM | POA: Diagnosis not present

## 2016-01-12 DIAGNOSIS — N186 End stage renal disease: Secondary | ICD-10-CM | POA: Diagnosis not present

## 2016-01-27 ENCOUNTER — Other Ambulatory Visit: Payer: Self-pay | Admitting: Nephrology

## 2016-01-27 DIAGNOSIS — Z7682 Awaiting organ transplant status: Secondary | ICD-10-CM

## 2016-02-04 ENCOUNTER — Other Ambulatory Visit
Admission: RE | Admit: 2016-02-04 | Discharge: 2016-02-04 | Disposition: A | Discharge status: Home / Self Care | Payer: Medicare Other | Source: Ambulatory Visit | Attending: Nephrology | Admitting: Nephrology

## 2016-02-04 DIAGNOSIS — D631 Anemia in chronic kidney disease: Secondary | ICD-10-CM | POA: Insufficient documentation

## 2016-02-04 DIAGNOSIS — N186 End stage renal disease: Secondary | ICD-10-CM | POA: Diagnosis not present

## 2016-02-04 LAB — CBC WITH DIFFERENTIAL/PLATELET
BASOS PCT: 1 %
Basophils Absolute: 0.1 10*3/uL (ref 0–0.1)
EOS ABS: 0.2 10*3/uL (ref 0–0.7)
EOS PCT: 3 %
HCT: 32.4 % — ABNORMAL LOW (ref 40.0–52.0)
HEMOGLOBIN: 11.1 g/dL — AB (ref 13.0–18.0)
LYMPHS ABS: 1.3 10*3/uL (ref 1.0–3.6)
Lymphocytes Relative: 23 %
MCH: 31 pg (ref 26.0–34.0)
MCHC: 34.1 g/dL (ref 32.0–36.0)
MCV: 91 fL (ref 80.0–100.0)
Monocytes Absolute: 0.4 10*3/uL (ref 0.2–1.0)
Monocytes Relative: 7 %
NEUTROS PCT: 66 %
Neutro Abs: 3.9 10*3/uL (ref 1.4–6.5)
PLATELETS: 160 10*3/uL (ref 150–440)
RBC: 3.56 MIL/uL — AB (ref 4.40–5.90)
RDW: 16.7 % — ABNORMAL HIGH (ref 11.5–14.5)
WBC: 5.9 10*3/uL (ref 3.8–10.6)

## 2016-02-04 LAB — RENAL FUNCTION PANEL
ANION GAP: 10 (ref 5–15)
Albumin: 3.8 g/dL (ref 3.5–5.0)
BUN: 39 mg/dL — ABNORMAL HIGH (ref 6–20)
CALCIUM: 9.4 mg/dL (ref 8.9–10.3)
CO2: 28 mmol/L (ref 22–32)
CREATININE: 9.29 mg/dL — AB (ref 0.61–1.24)
Chloride: 96 mmol/L — ABNORMAL LOW (ref 101–111)
GFR calc non Af Amer: 5 mL/min — ABNORMAL LOW (ref >60)
GFR, EST AFRICAN AMERICAN: 6 mL/min — AB (ref >60)
GLUCOSE: 78 mg/dL (ref 65–99)
Phosphorus: 5.5 mg/dL — ABNORMAL HIGH (ref 2.5–4.6)
Potassium: 3.7 mmol/L (ref 3.5–5.1)
SODIUM: 134 mmol/L — AB (ref 135–145)

## 2016-02-05 LAB — PTH, INTACT AND CALCIUM
Calcium, Total (PTH): 9.5 mg/dL (ref 8.6–10.2)
PTH: 759 pg/mL — AB (ref 15–65)

## 2016-02-06 ENCOUNTER — Other Ambulatory Visit: Payer: Self-pay

## 2016-02-06 ENCOUNTER — Ambulatory Visit (HOSPITAL_COMMUNITY): Payer: Medicare Other | Attending: Internal Medicine

## 2016-02-06 DIAGNOSIS — E785 Hyperlipidemia, unspecified: Secondary | ICD-10-CM | POA: Diagnosis not present

## 2016-02-06 DIAGNOSIS — I2510 Atherosclerotic heart disease of native coronary artery without angina pectoris: Secondary | ICD-10-CM | POA: Insufficient documentation

## 2016-02-06 DIAGNOSIS — Z7682 Awaiting organ transplant status: Secondary | ICD-10-CM

## 2016-02-06 DIAGNOSIS — D649 Anemia, unspecified: Secondary | ICD-10-CM | POA: Diagnosis not present

## 2016-02-06 DIAGNOSIS — I071 Rheumatic tricuspid insufficiency: Secondary | ICD-10-CM | POA: Insufficient documentation

## 2016-02-06 DIAGNOSIS — I4891 Unspecified atrial fibrillation: Secondary | ICD-10-CM | POA: Insufficient documentation

## 2016-02-06 DIAGNOSIS — G4733 Obstructive sleep apnea (adult) (pediatric): Secondary | ICD-10-CM | POA: Insufficient documentation

## 2016-02-06 DIAGNOSIS — N189 Chronic kidney disease, unspecified: Secondary | ICD-10-CM | POA: Diagnosis not present

## 2016-02-12 DIAGNOSIS — N186 End stage renal disease: Secondary | ICD-10-CM | POA: Diagnosis not present

## 2016-02-12 DIAGNOSIS — Z992 Dependence on renal dialysis: Secondary | ICD-10-CM | POA: Diagnosis not present

## 2016-02-14 ENCOUNTER — Telehealth: Payer: Self-pay | Admitting: Cardiology

## 2016-02-14 NOTE — Telephone Encounter (Signed)
New message       Request for surgical clearance:  1. What type of surgery is being performed?  Kidney transplant  When is this surgery scheduled? Pending clearance Are there any medications that need to be held prior to surgery and how long? Need something in writing stating pt is cleared to have a kidney transplant 2. Name of physician performing surgery?   3. What is your office phone and fax number?  Fax [phone]

## 2016-02-16 NOTE — Telephone Encounter (Signed)
He was last seen by [name] in March 2107. I haven't seen since March 2016. He has a history of coronary stents in April 2015. No ischemic evaluation since then. If he needs clearance for renal transplant he should have a stress Myoview and follow up office visit to assess risk.  [name] Martinique MD, [facility]

## 2016-02-18 NOTE — Telephone Encounter (Signed)
Spoke to Tavia at [facility] transplant team.She stated patient already had stress myoview done there on 01/23/16 results in care everywhere.Stated she needs a letter from [place] clearing patient for surgery. Spoke to patient appointment scheduled with Dr.Jordan 02/19/16 at 2:45 pm.
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
## 2016-02-19 ENCOUNTER — Ambulatory Visit (INDEPENDENT_AMBULATORY_CARE_PROVIDER_SITE_OTHER): Payer: Medicare Other | Admitting: Cardiology

## 2016-02-19 ENCOUNTER — Encounter: Payer: Self-pay | Admitting: Cardiology

## 2016-02-19 VITALS — BP 111/65 | HR 82 | Ht 71.0 in | Wt 262.0 lb

## 2016-02-19 DIAGNOSIS — N186 End stage renal disease: Secondary | ICD-10-CM

## 2016-02-19 DIAGNOSIS — E782 Mixed hyperlipidemia: Secondary | ICD-10-CM | POA: Diagnosis not present

## 2016-02-19 DIAGNOSIS — Z992 Dependence on renal dialysis: Secondary | ICD-10-CM

## 2016-02-19 DIAGNOSIS — I25119 Atherosclerotic heart disease of native coronary artery with unspecified angina pectoris: Secondary | ICD-10-CM | POA: Diagnosis not present

## 2016-02-19 DIAGNOSIS — R002 Palpitations: Secondary | ICD-10-CM | POA: Diagnosis not present

## 2016-02-19 DIAGNOSIS — I209 Angina pectoris, unspecified: Secondary | ICD-10-CM

## 2016-02-19 NOTE — Patient Instructions (Signed)
Continue your current therapy  I will see you in one year   

## 2016-02-20 ENCOUNTER — Telehealth: Payer: Self-pay | Admitting: Medical

## 2016-02-20 NOTE — Telephone Encounter (Signed)
Call and see how he is doing.   Lets get him in for f/u when he is ready.

## 2016-02-20 NOTE — Telephone Encounter (Signed)
Dr.Jordan's 02/19/16 office note and surgical clearance letter faxed to Tavia at [facility] transplant team on 02/19/16 at fax # [phone].

## 2016-03-01 DIAGNOSIS — Z992 Dependence on renal dialysis: Secondary | ICD-10-CM | POA: Diagnosis not present

## 2016-03-01 DIAGNOSIS — K5900 Constipation, unspecified: Secondary | ICD-10-CM | POA: Diagnosis not present

## 2016-03-01 DIAGNOSIS — I252 Old myocardial infarction: Secondary | ICD-10-CM | POA: Diagnosis not present

## 2016-03-01 DIAGNOSIS — Z01810 Encounter for preprocedural cardiovascular examination: Secondary | ICD-10-CM | POA: Diagnosis not present

## 2016-03-01 DIAGNOSIS — Z4822 Encounter for aftercare following kidney transplant: Secondary | ICD-10-CM | POA: Diagnosis not present

## 2016-03-01 DIAGNOSIS — N3289 Other specified disorders of bladder: Secondary | ICD-10-CM | POA: Diagnosis not present

## 2016-03-01 DIAGNOSIS — R739 Hyperglycemia, unspecified: Secondary | ICD-10-CM | POA: Diagnosis not present

## 2016-03-01 DIAGNOSIS — R319 Hematuria, unspecified: Secondary | ICD-10-CM | POA: Diagnosis not present

## 2016-03-01 DIAGNOSIS — I10 Essential (primary) hypertension: Secondary | ICD-10-CM | POA: Diagnosis not present

## 2016-03-01 DIAGNOSIS — Z7982 Long term (current) use of aspirin: Secondary | ICD-10-CM | POA: Diagnosis not present

## 2016-03-01 DIAGNOSIS — Z5181 Encounter for therapeutic drug level monitoring: Secondary | ICD-10-CM | POA: Diagnosis not present

## 2016-03-01 DIAGNOSIS — R918 Other nonspecific abnormal finding of lung field: Secondary | ICD-10-CM | POA: Diagnosis not present

## 2016-03-01 DIAGNOSIS — Z933 Colostomy status: Secondary | ICD-10-CM | POA: Diagnosis not present

## 2016-03-01 DIAGNOSIS — E785 Hyperlipidemia, unspecified: Secondary | ICD-10-CM | POA: Diagnosis present

## 2016-03-01 DIAGNOSIS — D631 Anemia in chronic kidney disease: Secondary | ICD-10-CM | POA: Diagnosis not present

## 2016-03-01 DIAGNOSIS — K219 Gastro-esophageal reflux disease without esophagitis: Secondary | ICD-10-CM | POA: Diagnosis not present

## 2016-03-01 DIAGNOSIS — N189 Chronic kidney disease, unspecified: Secondary | ICD-10-CM | POA: Diagnosis not present

## 2016-03-01 DIAGNOSIS — Z8546 Personal history of malignant neoplasm of prostate: Secondary | ICD-10-CM | POA: Diagnosis not present

## 2016-03-01 DIAGNOSIS — Z6837 Body mass index (BMI) 37.0-37.9, adult: Secondary | ICD-10-CM | POA: Diagnosis not present

## 2016-03-01 DIAGNOSIS — N2581 Secondary hyperparathyroidism of renal origin: Secondary | ICD-10-CM | POA: Diagnosis not present

## 2016-03-01 DIAGNOSIS — Z79899 Other long term (current) drug therapy: Secondary | ICD-10-CM | POA: Diagnosis not present

## 2016-03-01 DIAGNOSIS — J90 Pleural effusion, not elsewhere classified: Secondary | ICD-10-CM | POA: Diagnosis not present

## 2016-03-01 DIAGNOSIS — E8809 Other disorders of plasma-protein metabolism, not elsewhere classified: Secondary | ICD-10-CM | POA: Diagnosis not present

## 2016-03-01 DIAGNOSIS — Z940 Kidney transplant status: Secondary | ICD-10-CM | POA: Diagnosis not present

## 2016-03-01 DIAGNOSIS — I2510 Atherosclerotic heart disease of native coronary artery without angina pectoris: Secondary | ICD-10-CM | POA: Diagnosis not present

## 2016-03-01 DIAGNOSIS — R310 Gross hematuria: Secondary | ICD-10-CM | POA: Diagnosis not present

## 2016-03-01 DIAGNOSIS — Z955 Presence of coronary angioplasty implant and graft: Secondary | ICD-10-CM | POA: Diagnosis not present

## 2016-03-01 DIAGNOSIS — N170 Acute kidney failure with tubular necrosis: Secondary | ICD-10-CM | POA: Diagnosis not present

## 2016-03-01 DIAGNOSIS — E876 Hypokalemia: Secondary | ICD-10-CM | POA: Diagnosis not present

## 2016-03-01 DIAGNOSIS — D649 Anemia, unspecified: Secondary | ICD-10-CM | POA: Diagnosis not present

## 2016-03-01 DIAGNOSIS — I120 Hypertensive chronic kidney disease with stage 5 chronic kidney disease or end stage renal disease: Secondary | ICD-10-CM | POA: Diagnosis not present

## 2016-03-01 DIAGNOSIS — J810 Acute pulmonary edema: Secondary | ICD-10-CM | POA: Diagnosis not present

## 2016-03-01 DIAGNOSIS — J918 Pleural effusion in other conditions classified elsewhere: Secondary | ICD-10-CM | POA: Diagnosis not present

## 2016-03-01 DIAGNOSIS — N179 Acute kidney failure, unspecified: Secondary | ICD-10-CM | POA: Diagnosis not present

## 2016-03-01 DIAGNOSIS — I739 Peripheral vascular disease, unspecified: Secondary | ICD-10-CM | POA: Diagnosis present

## 2016-03-01 DIAGNOSIS — E669 Obesity, unspecified: Secondary | ICD-10-CM | POA: Diagnosis present

## 2016-03-01 DIAGNOSIS — Z8701 Personal history of pneumonia (recurrent): Secondary | ICD-10-CM | POA: Diagnosis not present

## 2016-03-01 DIAGNOSIS — Z923 Personal history of irradiation: Secondary | ICD-10-CM | POA: Diagnosis not present

## 2016-03-01 DIAGNOSIS — D72829 Elevated white blood cell count, unspecified: Secondary | ICD-10-CM | POA: Diagnosis not present

## 2016-03-01 DIAGNOSIS — N186 End stage renal disease: Secondary | ICD-10-CM | POA: Diagnosis present

## 2016-03-01 DIAGNOSIS — J948 Other specified pleural conditions: Secondary | ICD-10-CM | POA: Diagnosis not present

## 2016-03-01 DIAGNOSIS — Z9049 Acquired absence of other specified parts of digestive tract: Secondary | ICD-10-CM | POA: Diagnosis not present

## 2016-03-09 DIAGNOSIS — D899 Disorder involving the immune mechanism, unspecified: Secondary | ICD-10-CM | POA: Diagnosis not present

## 2016-03-09 DIAGNOSIS — N186 End stage renal disease: Secondary | ICD-10-CM | POA: Diagnosis not present

## 2016-03-09 DIAGNOSIS — Z79899 Other long term (current) drug therapy: Secondary | ICD-10-CM | POA: Diagnosis not present

## 2016-03-09 DIAGNOSIS — N179 Acute kidney failure, unspecified: Secondary | ICD-10-CM | POA: Diagnosis not present

## 2016-03-09 DIAGNOSIS — D649 Anemia, unspecified: Secondary | ICD-10-CM | POA: Diagnosis not present

## 2016-03-09 DIAGNOSIS — E213 Hyperparathyroidism, unspecified: Secondary | ICD-10-CM | POA: Diagnosis not present

## 2016-03-09 DIAGNOSIS — Z940 Kidney transplant status: Secondary | ICD-10-CM | POA: Diagnosis not present

## 2016-03-09 DIAGNOSIS — I120 Hypertensive chronic kidney disease with stage 5 chronic kidney disease or end stage renal disease: Secondary | ICD-10-CM | POA: Diagnosis not present

## 2016-03-09 DIAGNOSIS — Z4822 Encounter for aftercare following kidney transplant: Secondary | ICD-10-CM | POA: Diagnosis not present

## 2016-03-09 DIAGNOSIS — Z992 Dependence on renal dialysis: Secondary | ICD-10-CM | POA: Diagnosis not present

## 2016-03-09 DIAGNOSIS — E785 Hyperlipidemia, unspecified: Secondary | ICD-10-CM | POA: Diagnosis not present

## 2016-03-09 DIAGNOSIS — T8619 Other complication of kidney transplant: Secondary | ICD-10-CM | POA: Diagnosis not present

## 2016-03-09 DIAGNOSIS — I119 Hypertensive heart disease without heart failure: Secondary | ICD-10-CM | POA: Diagnosis not present

## 2016-03-11 DIAGNOSIS — I493 Ventricular premature depolarization: Secondary | ICD-10-CM | POA: Diagnosis not present

## 2016-03-12 DIAGNOSIS — I120 Hypertensive chronic kidney disease with stage 5 chronic kidney disease or end stage renal disease: Secondary | ICD-10-CM | POA: Diagnosis not present

## 2016-03-12 DIAGNOSIS — I2510 Atherosclerotic heart disease of native coronary artery without angina pectoris: Secondary | ICD-10-CM | POA: Diagnosis not present

## 2016-03-12 DIAGNOSIS — D649 Anemia, unspecified: Secondary | ICD-10-CM | POA: Diagnosis not present

## 2016-03-12 DIAGNOSIS — E212 Other hyperparathyroidism: Secondary | ICD-10-CM | POA: Diagnosis not present

## 2016-03-12 DIAGNOSIS — Z7982 Long term (current) use of aspirin: Secondary | ICD-10-CM | POA: Diagnosis not present

## 2016-03-12 DIAGNOSIS — D631 Anemia in chronic kidney disease: Secondary | ICD-10-CM | POA: Diagnosis not present

## 2016-03-12 DIAGNOSIS — E785 Hyperlipidemia, unspecified: Secondary | ICD-10-CM | POA: Diagnosis not present

## 2016-03-12 DIAGNOSIS — Z4822 Encounter for aftercare following kidney transplant: Secondary | ICD-10-CM | POA: Diagnosis not present

## 2016-03-12 DIAGNOSIS — Z955 Presence of coronary angioplasty implant and graft: Secondary | ICD-10-CM | POA: Diagnosis not present

## 2016-03-12 DIAGNOSIS — N186 End stage renal disease: Secondary | ICD-10-CM | POA: Diagnosis not present

## 2016-03-12 DIAGNOSIS — Z940 Kidney transplant status: Secondary | ICD-10-CM | POA: Diagnosis not present

## 2016-03-12 DIAGNOSIS — D8989 Other specified disorders involving the immune mechanism, not elsewhere classified: Secondary | ICD-10-CM | POA: Diagnosis not present

## 2016-03-12 DIAGNOSIS — I4891 Unspecified atrial fibrillation: Secondary | ICD-10-CM | POA: Diagnosis not present

## 2016-03-12 DIAGNOSIS — Z79899 Other long term (current) drug therapy: Secondary | ICD-10-CM | POA: Diagnosis not present

## 2016-03-12 DIAGNOSIS — I10 Essential (primary) hypertension: Secondary | ICD-10-CM | POA: Diagnosis not present

## 2016-03-12 DIAGNOSIS — Z87828 Personal history of other (healed) physical injury and trauma: Secondary | ICD-10-CM | POA: Diagnosis not present

## 2016-03-16 DIAGNOSIS — Z4822 Encounter for aftercare following kidney transplant: Secondary | ICD-10-CM | POA: Diagnosis not present

## 2016-03-16 DIAGNOSIS — N186 End stage renal disease: Secondary | ICD-10-CM | POA: Diagnosis not present

## 2016-03-16 DIAGNOSIS — Z713 Dietary counseling and surveillance: Secondary | ICD-10-CM | POA: Diagnosis not present

## 2016-03-16 DIAGNOSIS — D649 Anemia, unspecified: Secondary | ICD-10-CM | POA: Diagnosis not present

## 2016-03-16 DIAGNOSIS — Z940 Kidney transplant status: Secondary | ICD-10-CM | POA: Diagnosis not present

## 2016-03-16 DIAGNOSIS — E785 Hyperlipidemia, unspecified: Secondary | ICD-10-CM | POA: Diagnosis not present

## 2016-03-16 DIAGNOSIS — Z79899 Other long term (current) drug therapy: Secondary | ICD-10-CM | POA: Diagnosis not present

## 2016-03-16 DIAGNOSIS — Z955 Presence of coronary angioplasty implant and graft: Secondary | ICD-10-CM | POA: Diagnosis not present

## 2016-03-16 DIAGNOSIS — Z792 Long term (current) use of antibiotics: Secondary | ICD-10-CM | POA: Diagnosis not present

## 2016-03-16 DIAGNOSIS — D8989 Other specified disorders involving the immune mechanism, not elsewhere classified: Secondary | ICD-10-CM | POA: Diagnosis not present

## 2016-03-16 DIAGNOSIS — E871 Hypo-osmolality and hyponatremia: Secondary | ICD-10-CM | POA: Diagnosis not present

## 2016-03-18 DIAGNOSIS — D8989 Other specified disorders involving the immune mechanism, not elsewhere classified: Secondary | ICD-10-CM | POA: Diagnosis not present

## 2016-03-18 DIAGNOSIS — Z955 Presence of coronary angioplasty implant and graft: Secondary | ICD-10-CM | POA: Diagnosis not present

## 2016-03-18 DIAGNOSIS — E871 Hypo-osmolality and hyponatremia: Secondary | ICD-10-CM | POA: Diagnosis not present

## 2016-03-18 DIAGNOSIS — Z792 Long term (current) use of antibiotics: Secondary | ICD-10-CM | POA: Diagnosis not present

## 2016-03-18 DIAGNOSIS — E785 Hyperlipidemia, unspecified: Secondary | ICD-10-CM | POA: Diagnosis not present

## 2016-03-18 DIAGNOSIS — Z5181 Encounter for therapeutic drug level monitoring: Secondary | ICD-10-CM | POA: Diagnosis not present

## 2016-03-18 DIAGNOSIS — Z79899 Other long term (current) drug therapy: Secondary | ICD-10-CM | POA: Diagnosis not present

## 2016-03-18 DIAGNOSIS — I10 Essential (primary) hypertension: Secondary | ICD-10-CM | POA: Diagnosis not present

## 2016-03-18 DIAGNOSIS — Z940 Kidney transplant status: Secondary | ICD-10-CM | POA: Diagnosis not present

## 2016-03-18 DIAGNOSIS — B371 Pulmonary candidiasis: Secondary | ICD-10-CM | POA: Diagnosis not present

## 2016-03-18 DIAGNOSIS — D649 Anemia, unspecified: Secondary | ICD-10-CM | POA: Diagnosis not present

## 2016-03-20 DIAGNOSIS — I10 Essential (primary) hypertension: Secondary | ICD-10-CM | POA: Diagnosis not present

## 2016-03-20 DIAGNOSIS — Z940 Kidney transplant status: Secondary | ICD-10-CM | POA: Diagnosis not present

## 2016-03-20 DIAGNOSIS — Z4822 Encounter for aftercare following kidney transplant: Secondary | ICD-10-CM | POA: Diagnosis not present

## 2016-03-20 DIAGNOSIS — E871 Hypo-osmolality and hyponatremia: Secondary | ICD-10-CM | POA: Diagnosis not present

## 2016-03-20 DIAGNOSIS — Z79899 Other long term (current) drug therapy: Secondary | ICD-10-CM | POA: Diagnosis not present

## 2016-03-20 DIAGNOSIS — D649 Anemia, unspecified: Secondary | ICD-10-CM | POA: Diagnosis not present

## 2016-03-20 DIAGNOSIS — E212 Other hyperparathyroidism: Secondary | ICD-10-CM | POA: Diagnosis not present

## 2016-03-20 DIAGNOSIS — N186 End stage renal disease: Secondary | ICD-10-CM | POA: Diagnosis not present

## 2016-03-20 DIAGNOSIS — D8989 Other specified disorders involving the immune mechanism, not elsewhere classified: Secondary | ICD-10-CM | POA: Diagnosis not present

## 2016-03-20 DIAGNOSIS — N179 Acute kidney failure, unspecified: Secondary | ICD-10-CM | POA: Diagnosis not present

## 2016-03-20 DIAGNOSIS — E785 Hyperlipidemia, unspecified: Secondary | ICD-10-CM | POA: Diagnosis not present

## 2016-03-20 DIAGNOSIS — Z955 Presence of coronary angioplasty implant and graft: Secondary | ICD-10-CM | POA: Diagnosis not present

## 2016-03-20 DIAGNOSIS — Z792 Long term (current) use of antibiotics: Secondary | ICD-10-CM | POA: Diagnosis not present

## 2016-03-23 DIAGNOSIS — Z940 Kidney transplant status: Secondary | ICD-10-CM | POA: Diagnosis not present

## 2016-03-23 DIAGNOSIS — Z4802 Encounter for removal of sutures: Secondary | ICD-10-CM | POA: Diagnosis not present

## 2016-03-23 DIAGNOSIS — E213 Hyperparathyroidism, unspecified: Secondary | ICD-10-CM | POA: Diagnosis not present

## 2016-03-23 DIAGNOSIS — Z955 Presence of coronary angioplasty implant and graft: Secondary | ICD-10-CM | POA: Diagnosis not present

## 2016-03-23 DIAGNOSIS — Z79899 Other long term (current) drug therapy: Secondary | ICD-10-CM | POA: Diagnosis not present

## 2016-03-23 DIAGNOSIS — E785 Hyperlipidemia, unspecified: Secondary | ICD-10-CM | POA: Diagnosis not present

## 2016-03-23 DIAGNOSIS — D8989 Other specified disorders involving the immune mechanism, not elsewhere classified: Secondary | ICD-10-CM | POA: Diagnosis not present

## 2016-03-23 DIAGNOSIS — D649 Anemia, unspecified: Secondary | ICD-10-CM | POA: Diagnosis not present

## 2016-03-23 DIAGNOSIS — I10 Essential (primary) hypertension: Secondary | ICD-10-CM | POA: Diagnosis not present

## 2016-03-23 DIAGNOSIS — Z4822 Encounter for aftercare following kidney transplant: Secondary | ICD-10-CM | POA: Diagnosis not present

## 2016-03-26 DIAGNOSIS — Z466 Encounter for fitting and adjustment of urinary device: Secondary | ICD-10-CM | POA: Diagnosis not present

## 2016-03-26 DIAGNOSIS — D899 Disorder involving the immune mechanism, unspecified: Secondary | ICD-10-CM | POA: Diagnosis not present

## 2016-03-26 DIAGNOSIS — Z01818 Encounter for other preprocedural examination: Secondary | ICD-10-CM | POA: Diagnosis not present

## 2016-03-26 DIAGNOSIS — Z79899 Other long term (current) drug therapy: Secondary | ICD-10-CM | POA: Diagnosis not present

## 2016-03-26 DIAGNOSIS — Z4822 Encounter for aftercare following kidney transplant: Secondary | ICD-10-CM | POA: Diagnosis not present

## 2016-03-26 DIAGNOSIS — Z5181 Encounter for therapeutic drug level monitoring: Secondary | ICD-10-CM | POA: Diagnosis not present

## 2016-03-26 DIAGNOSIS — Z940 Kidney transplant status: Secondary | ICD-10-CM | POA: Diagnosis not present

## 2016-03-30 DIAGNOSIS — N179 Acute kidney failure, unspecified: Secondary | ICD-10-CM | POA: Diagnosis not present

## 2016-03-30 DIAGNOSIS — D649 Anemia, unspecified: Secondary | ICD-10-CM | POA: Diagnosis not present

## 2016-03-30 DIAGNOSIS — E785 Hyperlipidemia, unspecified: Secondary | ICD-10-CM | POA: Diagnosis not present

## 2016-03-30 DIAGNOSIS — Z79899 Other long term (current) drug therapy: Secondary | ICD-10-CM | POA: Diagnosis not present

## 2016-03-30 DIAGNOSIS — Z940 Kidney transplant status: Secondary | ICD-10-CM | POA: Diagnosis not present

## 2016-03-30 DIAGNOSIS — D8989 Other specified disorders involving the immune mechanism, not elsewhere classified: Secondary | ICD-10-CM | POA: Diagnosis not present

## 2016-03-30 DIAGNOSIS — N186 End stage renal disease: Secondary | ICD-10-CM | POA: Diagnosis not present

## 2016-03-30 DIAGNOSIS — I120 Hypertensive chronic kidney disease with stage 5 chronic kidney disease or end stage renal disease: Secondary | ICD-10-CM | POA: Diagnosis not present

## 2016-03-30 DIAGNOSIS — E212 Other hyperparathyroidism: Secondary | ICD-10-CM | POA: Diagnosis not present

## 2016-03-30 DIAGNOSIS — I10 Essential (primary) hypertension: Secondary | ICD-10-CM | POA: Diagnosis not present

## 2016-03-30 DIAGNOSIS — Z4822 Encounter for aftercare following kidney transplant: Secondary | ICD-10-CM | POA: Diagnosis not present

## 2016-04-02 DIAGNOSIS — N269 Renal sclerosis, unspecified: Secondary | ICD-10-CM | POA: Diagnosis not present

## 2016-04-02 DIAGNOSIS — Z4822 Encounter for aftercare following kidney transplant: Secondary | ICD-10-CM | POA: Diagnosis not present

## 2016-04-02 DIAGNOSIS — Z79899 Other long term (current) drug therapy: Secondary | ICD-10-CM | POA: Diagnosis not present

## 2016-04-02 DIAGNOSIS — I10 Essential (primary) hypertension: Secondary | ICD-10-CM | POA: Diagnosis not present

## 2016-04-02 DIAGNOSIS — Z01818 Encounter for other preprocedural examination: Secondary | ICD-10-CM | POA: Diagnosis not present

## 2016-04-02 DIAGNOSIS — N261 Atrophy of kidney (terminal): Secondary | ICD-10-CM | POA: Diagnosis not present

## 2016-04-02 DIAGNOSIS — Z940 Kidney transplant status: Secondary | ICD-10-CM | POA: Diagnosis not present

## 2016-04-08 DIAGNOSIS — I120 Hypertensive chronic kidney disease with stage 5 chronic kidney disease or end stage renal disease: Secondary | ICD-10-CM | POA: Diagnosis not present

## 2016-04-08 DIAGNOSIS — I2510 Atherosclerotic heart disease of native coronary artery without angina pectoris: Secondary | ICD-10-CM | POA: Diagnosis not present

## 2016-04-08 DIAGNOSIS — I4891 Unspecified atrial fibrillation: Secondary | ICD-10-CM | POA: Diagnosis not present

## 2016-04-08 DIAGNOSIS — D8989 Other specified disorders involving the immune mechanism, not elsewhere classified: Secondary | ICD-10-CM | POA: Diagnosis not present

## 2016-04-08 DIAGNOSIS — D631 Anemia in chronic kidney disease: Secondary | ICD-10-CM | POA: Diagnosis not present

## 2016-04-08 DIAGNOSIS — D899 Disorder involving the immune mechanism, unspecified: Secondary | ICD-10-CM | POA: Diagnosis not present

## 2016-04-08 DIAGNOSIS — Z79899 Other long term (current) drug therapy: Secondary | ICD-10-CM | POA: Diagnosis not present

## 2016-04-08 DIAGNOSIS — N186 End stage renal disease: Secondary | ICD-10-CM | POA: Diagnosis not present

## 2016-04-08 DIAGNOSIS — Z940 Kidney transplant status: Secondary | ICD-10-CM | POA: Diagnosis not present

## 2016-04-08 DIAGNOSIS — Z6837 Body mass index (BMI) 37.0-37.9, adult: Secondary | ICD-10-CM | POA: Diagnosis not present

## 2016-04-08 DIAGNOSIS — I10 Essential (primary) hypertension: Secondary | ICD-10-CM | POA: Diagnosis not present

## 2016-04-08 DIAGNOSIS — Z4822 Encounter for aftercare following kidney transplant: Secondary | ICD-10-CM | POA: Diagnosis not present

## 2016-04-08 DIAGNOSIS — Z7982 Long term (current) use of aspirin: Secondary | ICD-10-CM | POA: Diagnosis not present

## 2016-04-08 DIAGNOSIS — E785 Hyperlipidemia, unspecified: Secondary | ICD-10-CM | POA: Diagnosis not present

## 2016-04-08 DIAGNOSIS — E212 Other hyperparathyroidism: Secondary | ICD-10-CM | POA: Diagnosis not present

## 2016-04-08 DIAGNOSIS — Z7952 Long term (current) use of systemic steroids: Secondary | ICD-10-CM | POA: Diagnosis not present

## 2016-04-08 DIAGNOSIS — D649 Anemia, unspecified: Secondary | ICD-10-CM | POA: Diagnosis not present

## 2016-04-15 DIAGNOSIS — Z992 Dependence on renal dialysis: Secondary | ICD-10-CM | POA: Diagnosis not present

## 2016-04-15 DIAGNOSIS — N186 End stage renal disease: Secondary | ICD-10-CM | POA: Diagnosis not present

## 2016-04-15 DIAGNOSIS — D8989 Other specified disorders involving the immune mechanism, not elsewhere classified: Secondary | ICD-10-CM | POA: Diagnosis not present

## 2016-04-15 DIAGNOSIS — Z940 Kidney transplant status: Secondary | ICD-10-CM | POA: Diagnosis not present

## 2016-04-15 DIAGNOSIS — I10 Essential (primary) hypertension: Secondary | ICD-10-CM | POA: Diagnosis not present

## 2016-04-15 DIAGNOSIS — D649 Anemia, unspecified: Secondary | ICD-10-CM | POA: Diagnosis not present

## 2016-04-15 DIAGNOSIS — E785 Hyperlipidemia, unspecified: Secondary | ICD-10-CM | POA: Diagnosis not present

## 2016-04-15 DIAGNOSIS — Z792 Long term (current) use of antibiotics: Secondary | ICD-10-CM | POA: Diagnosis not present

## 2016-04-15 DIAGNOSIS — Z4822 Encounter for aftercare following kidney transplant: Secondary | ICD-10-CM | POA: Diagnosis not present

## 2016-04-15 DIAGNOSIS — E212 Other hyperparathyroidism: Secondary | ICD-10-CM | POA: Diagnosis not present

## 2016-04-15 DIAGNOSIS — Z79899 Other long term (current) drug therapy: Secondary | ICD-10-CM | POA: Diagnosis not present

## 2016-04-15 DIAGNOSIS — N179 Acute kidney failure, unspecified: Secondary | ICD-10-CM | POA: Diagnosis not present

## 2016-04-15 DIAGNOSIS — I120 Hypertensive chronic kidney disease with stage 5 chronic kidney disease or end stage renal disease: Secondary | ICD-10-CM | POA: Diagnosis not present

## 2016-04-22 DIAGNOSIS — I120 Hypertensive chronic kidney disease with stage 5 chronic kidney disease or end stage renal disease: Secondary | ICD-10-CM | POA: Diagnosis not present

## 2016-04-22 DIAGNOSIS — Z7982 Long term (current) use of aspirin: Secondary | ICD-10-CM | POA: Diagnosis not present

## 2016-04-22 DIAGNOSIS — D8989 Other specified disorders involving the immune mechanism, not elsewhere classified: Secondary | ICD-10-CM | POA: Diagnosis not present

## 2016-04-22 DIAGNOSIS — N269 Renal sclerosis, unspecified: Secondary | ICD-10-CM | POA: Diagnosis not present

## 2016-04-22 DIAGNOSIS — I2510 Atherosclerotic heart disease of native coronary artery without angina pectoris: Secondary | ICD-10-CM | POA: Diagnosis not present

## 2016-04-22 DIAGNOSIS — R351 Nocturia: Secondary | ICD-10-CM | POA: Diagnosis not present

## 2016-04-22 DIAGNOSIS — D649 Anemia, unspecified: Secondary | ICD-10-CM | POA: Diagnosis not present

## 2016-04-22 DIAGNOSIS — D899 Disorder involving the immune mechanism, unspecified: Secondary | ICD-10-CM | POA: Diagnosis not present

## 2016-04-22 DIAGNOSIS — E785 Hyperlipidemia, unspecified: Secondary | ICD-10-CM | POA: Diagnosis not present

## 2016-04-22 DIAGNOSIS — Z79899 Other long term (current) drug therapy: Secondary | ICD-10-CM | POA: Diagnosis not present

## 2016-04-22 DIAGNOSIS — Z940 Kidney transplant status: Secondary | ICD-10-CM | POA: Diagnosis not present

## 2016-04-22 DIAGNOSIS — I10 Essential (primary) hypertension: Secondary | ICD-10-CM | POA: Diagnosis not present

## 2016-04-22 DIAGNOSIS — I4891 Unspecified atrial fibrillation: Secondary | ICD-10-CM | POA: Diagnosis not present

## 2016-04-22 DIAGNOSIS — H578 Other specified disorders of eye and adnexa: Secondary | ICD-10-CM | POA: Diagnosis not present

## 2016-04-22 DIAGNOSIS — N186 End stage renal disease: Secondary | ICD-10-CM | POA: Diagnosis not present

## 2016-04-22 DIAGNOSIS — T8619 Other complication of kidney transplant: Secondary | ICD-10-CM | POA: Diagnosis not present

## 2016-04-22 DIAGNOSIS — Z4822 Encounter for aftercare following kidney transplant: Secondary | ICD-10-CM | POA: Diagnosis not present

## 2016-04-29 DIAGNOSIS — D72819 Decreased white blood cell count, unspecified: Secondary | ICD-10-CM | POA: Diagnosis not present

## 2016-04-29 DIAGNOSIS — Z4822 Encounter for aftercare following kidney transplant: Secondary | ICD-10-CM | POA: Diagnosis not present

## 2016-04-29 DIAGNOSIS — Z940 Kidney transplant status: Secondary | ICD-10-CM | POA: Diagnosis not present

## 2016-04-29 DIAGNOSIS — E785 Hyperlipidemia, unspecified: Secondary | ICD-10-CM | POA: Diagnosis not present

## 2016-04-29 DIAGNOSIS — Z7952 Long term (current) use of systemic steroids: Secondary | ICD-10-CM | POA: Diagnosis not present

## 2016-04-29 DIAGNOSIS — E212 Other hyperparathyroidism: Secondary | ICD-10-CM | POA: Diagnosis not present

## 2016-04-29 DIAGNOSIS — D649 Anemia, unspecified: Secondary | ICD-10-CM | POA: Diagnosis not present

## 2016-04-29 DIAGNOSIS — I10 Essential (primary) hypertension: Secondary | ICD-10-CM | POA: Diagnosis not present

## 2016-04-29 DIAGNOSIS — Z79899 Other long term (current) drug therapy: Secondary | ICD-10-CM | POA: Diagnosis not present

## 2016-04-29 DIAGNOSIS — D8989 Other specified disorders involving the immune mechanism, not elsewhere classified: Secondary | ICD-10-CM | POA: Diagnosis not present

## 2016-04-29 DIAGNOSIS — Z792 Long term (current) use of antibiotics: Secondary | ICD-10-CM | POA: Diagnosis not present

## 2016-05-13 DIAGNOSIS — D631 Anemia in chronic kidney disease: Secondary | ICD-10-CM | POA: Diagnosis not present

## 2016-05-13 DIAGNOSIS — E212 Other hyperparathyroidism: Secondary | ICD-10-CM | POA: Diagnosis not present

## 2016-05-13 DIAGNOSIS — R351 Nocturia: Secondary | ICD-10-CM | POA: Diagnosis not present

## 2016-05-13 DIAGNOSIS — Z4822 Encounter for aftercare following kidney transplant: Secondary | ICD-10-CM | POA: Diagnosis not present

## 2016-05-13 DIAGNOSIS — D72819 Decreased white blood cell count, unspecified: Secondary | ICD-10-CM | POA: Diagnosis not present

## 2016-05-13 DIAGNOSIS — R0981 Nasal congestion: Secondary | ICD-10-CM | POA: Diagnosis not present

## 2016-05-13 DIAGNOSIS — I120 Hypertensive chronic kidney disease with stage 5 chronic kidney disease or end stage renal disease: Secondary | ICD-10-CM | POA: Diagnosis not present

## 2016-05-13 DIAGNOSIS — Z5181 Encounter for therapeutic drug level monitoring: Secondary | ICD-10-CM | POA: Diagnosis not present

## 2016-05-13 DIAGNOSIS — N179 Acute kidney failure, unspecified: Secondary | ICD-10-CM | POA: Diagnosis not present

## 2016-05-13 DIAGNOSIS — D899 Disorder involving the immune mechanism, unspecified: Secondary | ICD-10-CM | POA: Diagnosis not present

## 2016-05-13 DIAGNOSIS — E785 Hyperlipidemia, unspecified: Secondary | ICD-10-CM | POA: Diagnosis not present

## 2016-05-13 DIAGNOSIS — Z940 Kidney transplant status: Secondary | ICD-10-CM | POA: Diagnosis not present

## 2016-05-13 DIAGNOSIS — Z9889 Other specified postprocedural states: Secondary | ICD-10-CM | POA: Diagnosis not present

## 2016-05-13 DIAGNOSIS — T8619 Other complication of kidney transplant: Secondary | ICD-10-CM | POA: Diagnosis not present

## 2016-05-13 DIAGNOSIS — I770 Arteriovenous fistula, acquired: Secondary | ICD-10-CM | POA: Diagnosis not present

## 2016-05-13 DIAGNOSIS — N186 End stage renal disease: Secondary | ICD-10-CM | POA: Diagnosis not present

## 2016-05-13 DIAGNOSIS — Z79899 Other long term (current) drug therapy: Secondary | ICD-10-CM | POA: Diagnosis not present

## 2016-05-27 DIAGNOSIS — E785 Hyperlipidemia, unspecified: Secondary | ICD-10-CM | POA: Diagnosis not present

## 2016-05-27 DIAGNOSIS — Z940 Kidney transplant status: Secondary | ICD-10-CM | POA: Diagnosis not present

## 2016-05-27 DIAGNOSIS — D72819 Decreased white blood cell count, unspecified: Secondary | ICD-10-CM | POA: Diagnosis not present

## 2016-05-27 DIAGNOSIS — I10 Essential (primary) hypertension: Secondary | ICD-10-CM | POA: Diagnosis not present

## 2016-05-27 DIAGNOSIS — Z5181 Encounter for therapeutic drug level monitoring: Secondary | ICD-10-CM | POA: Diagnosis not present

## 2016-05-27 DIAGNOSIS — D899 Disorder involving the immune mechanism, unspecified: Secondary | ICD-10-CM | POA: Diagnosis not present

## 2016-05-27 DIAGNOSIS — Z79899 Other long term (current) drug therapy: Secondary | ICD-10-CM | POA: Diagnosis not present

## 2016-05-27 DIAGNOSIS — E212 Other hyperparathyroidism: Secondary | ICD-10-CM | POA: Diagnosis not present

## 2016-05-27 DIAGNOSIS — Z4822 Encounter for aftercare following kidney transplant: Secondary | ICD-10-CM | POA: Diagnosis not present

## 2016-05-27 DIAGNOSIS — Z955 Presence of coronary angioplasty implant and graft: Secondary | ICD-10-CM | POA: Diagnosis not present

## 2016-06-17 ENCOUNTER — Telehealth: Payer: Self-pay | Admitting: Medical

## 2016-06-17 NOTE — Telephone Encounter (Signed)
Called pt to scheduled AWV & he states he is being followed by [facility] since his kidney transplant.  He has an appointment this Friday there & will find out if he should keep following up with them or go back to primary doctor and let us know.

## 2016-06-18 NOTE — Telephone Encounter (Signed)
Being that we are primary care, we like to be in the loop, so he does need a wellness visit here at his convenience.  However, he should always keep his appt with transplant center.  The appt here is to review chart, update the records, and address any concerns.  Our other role is to see him for acute issues if he can't get to the medical center.

## 2016-06-19 DIAGNOSIS — Z955 Presence of coronary angioplasty implant and graft: Secondary | ICD-10-CM | POA: Diagnosis not present

## 2016-06-19 DIAGNOSIS — Z940 Kidney transplant status: Secondary | ICD-10-CM | POA: Diagnosis not present

## 2016-06-19 DIAGNOSIS — I10 Essential (primary) hypertension: Secondary | ICD-10-CM | POA: Diagnosis not present

## 2016-06-19 DIAGNOSIS — N179 Acute kidney failure, unspecified: Secondary | ICD-10-CM | POA: Diagnosis not present

## 2016-06-19 DIAGNOSIS — D899 Disorder involving the immune mechanism, unspecified: Secondary | ICD-10-CM | POA: Diagnosis not present

## 2016-06-19 DIAGNOSIS — E785 Hyperlipidemia, unspecified: Secondary | ICD-10-CM | POA: Diagnosis not present

## 2016-06-19 DIAGNOSIS — Z79899 Other long term (current) drug therapy: Secondary | ICD-10-CM | POA: Diagnosis not present

## 2016-06-19 DIAGNOSIS — Z992 Dependence on renal dialysis: Secondary | ICD-10-CM | POA: Diagnosis not present

## 2016-06-19 DIAGNOSIS — D631 Anemia in chronic kidney disease: Secondary | ICD-10-CM | POA: Diagnosis not present

## 2016-06-19 DIAGNOSIS — Z5181 Encounter for therapeutic drug level monitoring: Secondary | ICD-10-CM | POA: Diagnosis not present

## 2016-06-19 DIAGNOSIS — D72819 Decreased white blood cell count, unspecified: Secondary | ICD-10-CM | POA: Diagnosis not present

## 2016-06-19 DIAGNOSIS — N186 End stage renal disease: Secondary | ICD-10-CM | POA: Diagnosis not present

## 2016-06-19 DIAGNOSIS — Z4822 Encounter for aftercare following kidney transplant: Secondary | ICD-10-CM | POA: Diagnosis not present

## 2016-06-19 DIAGNOSIS — E212 Other hyperparathyroidism: Secondary | ICD-10-CM | POA: Diagnosis not present

## 2016-06-19 DIAGNOSIS — Z7982 Long term (current) use of aspirin: Secondary | ICD-10-CM | POA: Diagnosis not present

## 2016-06-19 DIAGNOSIS — I120 Hypertensive chronic kidney disease with stage 5 chronic kidney disease or end stage renal disease: Secondary | ICD-10-CM | POA: Diagnosis not present

## 2016-07-03 DIAGNOSIS — N186 End stage renal disease: Secondary | ICD-10-CM | POA: Diagnosis not present

## 2016-07-03 DIAGNOSIS — Z992 Dependence on renal dialysis: Secondary | ICD-10-CM | POA: Diagnosis not present

## 2016-07-03 DIAGNOSIS — R609 Edema, unspecified: Secondary | ICD-10-CM | POA: Diagnosis not present

## 2016-07-03 DIAGNOSIS — D899 Disorder involving the immune mechanism, unspecified: Secondary | ICD-10-CM | POA: Diagnosis not present

## 2016-07-03 DIAGNOSIS — I10 Essential (primary) hypertension: Secondary | ICD-10-CM | POA: Diagnosis not present

## 2016-07-03 DIAGNOSIS — Z4822 Encounter for aftercare following kidney transplant: Secondary | ICD-10-CM | POA: Diagnosis not present

## 2016-07-03 DIAGNOSIS — I4891 Unspecified atrial fibrillation: Secondary | ICD-10-CM | POA: Diagnosis not present

## 2016-07-03 DIAGNOSIS — E785 Hyperlipidemia, unspecified: Secondary | ICD-10-CM | POA: Diagnosis not present

## 2016-07-03 DIAGNOSIS — Z6838 Body mass index (BMI) 38.0-38.9, adult: Secondary | ICD-10-CM | POA: Diagnosis not present

## 2016-07-03 DIAGNOSIS — D8989 Other specified disorders involving the immune mechanism, not elsewhere classified: Secondary | ICD-10-CM | POA: Diagnosis not present

## 2016-07-03 DIAGNOSIS — Z955 Presence of coronary angioplasty implant and graft: Secondary | ICD-10-CM | POA: Diagnosis not present

## 2016-07-03 DIAGNOSIS — Z7952 Long term (current) use of systemic steroids: Secondary | ICD-10-CM | POA: Diagnosis not present

## 2016-07-03 DIAGNOSIS — D649 Anemia, unspecified: Secondary | ICD-10-CM | POA: Diagnosis not present

## 2016-07-03 DIAGNOSIS — R600 Localized edema: Secondary | ICD-10-CM | POA: Diagnosis not present

## 2016-07-03 DIAGNOSIS — D631 Anemia in chronic kidney disease: Secondary | ICD-10-CM | POA: Diagnosis not present

## 2016-07-03 DIAGNOSIS — I120 Hypertensive chronic kidney disease with stage 5 chronic kidney disease or end stage renal disease: Secondary | ICD-10-CM | POA: Diagnosis not present

## 2016-07-03 DIAGNOSIS — E212 Other hyperparathyroidism: Secondary | ICD-10-CM | POA: Diagnosis not present

## 2016-07-03 DIAGNOSIS — N179 Acute kidney failure, unspecified: Secondary | ICD-10-CM | POA: Diagnosis not present

## 2016-07-03 DIAGNOSIS — Z940 Kidney transplant status: Secondary | ICD-10-CM | POA: Diagnosis not present

## 2016-07-03 DIAGNOSIS — I739 Peripheral vascular disease, unspecified: Secondary | ICD-10-CM | POA: Diagnosis not present

## 2016-07-03 DIAGNOSIS — I2510 Atherosclerotic heart disease of native coronary artery without angina pectoris: Secondary | ICD-10-CM | POA: Diagnosis not present

## 2016-07-03 DIAGNOSIS — Z79899 Other long term (current) drug therapy: Secondary | ICD-10-CM | POA: Diagnosis not present

## 2016-07-03 DIAGNOSIS — Z792 Long term (current) use of antibiotics: Secondary | ICD-10-CM | POA: Diagnosis not present

## 2016-07-03 DIAGNOSIS — Z7982 Long term (current) use of aspirin: Secondary | ICD-10-CM | POA: Diagnosis not present

## 2016-07-06 NOTE — Telephone Encounter (Signed)
Called pt back & he will call us back in the next month to schedule wellness visit

## 2016-07-31 DIAGNOSIS — R350 Frequency of micturition: Secondary | ICD-10-CM | POA: Diagnosis not present

## 2016-07-31 DIAGNOSIS — T8619 Other complication of kidney transplant: Secondary | ICD-10-CM | POA: Diagnosis not present

## 2016-07-31 DIAGNOSIS — Z5181 Encounter for therapeutic drug level monitoring: Secondary | ICD-10-CM | POA: Diagnosis not present

## 2016-07-31 DIAGNOSIS — C61 Malignant neoplasm of prostate: Secondary | ICD-10-CM | POA: Diagnosis not present

## 2016-07-31 DIAGNOSIS — D649 Anemia, unspecified: Secondary | ICD-10-CM | POA: Diagnosis not present

## 2016-07-31 DIAGNOSIS — D899 Disorder involving the immune mechanism, unspecified: Secondary | ICD-10-CM | POA: Diagnosis not present

## 2016-07-31 DIAGNOSIS — Z940 Kidney transplant status: Secondary | ICD-10-CM | POA: Diagnosis not present

## 2016-07-31 DIAGNOSIS — Z792 Long term (current) use of antibiotics: Secondary | ICD-10-CM | POA: Diagnosis not present

## 2016-07-31 DIAGNOSIS — N269 Renal sclerosis, unspecified: Secondary | ICD-10-CM | POA: Diagnosis not present

## 2016-07-31 DIAGNOSIS — R351 Nocturia: Secondary | ICD-10-CM | POA: Diagnosis not present

## 2016-07-31 DIAGNOSIS — E785 Hyperlipidemia, unspecified: Secondary | ICD-10-CM | POA: Diagnosis not present

## 2016-07-31 DIAGNOSIS — I10 Essential (primary) hypertension: Secondary | ICD-10-CM | POA: Diagnosis not present

## 2016-07-31 DIAGNOSIS — Z7952 Long term (current) use of systemic steroids: Secondary | ICD-10-CM | POA: Diagnosis not present

## 2016-07-31 DIAGNOSIS — R609 Edema, unspecified: Secondary | ICD-10-CM | POA: Diagnosis not present

## 2016-07-31 DIAGNOSIS — D8989 Other specified disorders involving the immune mechanism, not elsewhere classified: Secondary | ICD-10-CM | POA: Diagnosis not present

## 2016-07-31 DIAGNOSIS — E212 Other hyperparathyroidism: Secondary | ICD-10-CM | POA: Diagnosis not present

## 2016-07-31 DIAGNOSIS — Z79899 Other long term (current) drug therapy: Secondary | ICD-10-CM | POA: Diagnosis not present

## 2016-08-14 DIAGNOSIS — Z7982 Long term (current) use of aspirin: Secondary | ICD-10-CM | POA: Diagnosis not present

## 2016-08-14 DIAGNOSIS — I4891 Unspecified atrial fibrillation: Secondary | ICD-10-CM | POA: Diagnosis not present

## 2016-08-14 DIAGNOSIS — D631 Anemia in chronic kidney disease: Secondary | ICD-10-CM | POA: Diagnosis not present

## 2016-08-14 DIAGNOSIS — Z4822 Encounter for aftercare following kidney transplant: Secondary | ICD-10-CM | POA: Diagnosis not present

## 2016-08-14 DIAGNOSIS — I2510 Atherosclerotic heart disease of native coronary artery without angina pectoris: Secondary | ICD-10-CM | POA: Diagnosis not present

## 2016-08-14 DIAGNOSIS — E785 Hyperlipidemia, unspecified: Secondary | ICD-10-CM | POA: Diagnosis not present

## 2016-08-14 DIAGNOSIS — R197 Diarrhea, unspecified: Secondary | ICD-10-CM | POA: Diagnosis not present

## 2016-08-14 DIAGNOSIS — Z79899 Other long term (current) drug therapy: Secondary | ICD-10-CM | POA: Diagnosis not present

## 2016-08-14 DIAGNOSIS — I120 Hypertensive chronic kidney disease with stage 5 chronic kidney disease or end stage renal disease: Secondary | ICD-10-CM | POA: Diagnosis not present

## 2016-08-14 DIAGNOSIS — Z885 Allergy status to narcotic agent status: Secondary | ICD-10-CM | POA: Diagnosis not present

## 2016-08-14 DIAGNOSIS — N186 End stage renal disease: Secondary | ICD-10-CM | POA: Diagnosis not present

## 2016-08-14 DIAGNOSIS — E860 Dehydration: Secondary | ICD-10-CM | POA: Diagnosis not present

## 2016-08-14 DIAGNOSIS — Z6838 Body mass index (BMI) 38.0-38.9, adult: Secondary | ICD-10-CM | POA: Diagnosis not present

## 2016-08-15 DIAGNOSIS — D631 Anemia in chronic kidney disease: Secondary | ICD-10-CM | POA: Diagnosis not present

## 2016-08-15 DIAGNOSIS — I2510 Atherosclerotic heart disease of native coronary artery without angina pectoris: Secondary | ICD-10-CM | POA: Diagnosis not present

## 2016-08-15 DIAGNOSIS — R197 Diarrhea, unspecified: Secondary | ICD-10-CM | POA: Diagnosis not present

## 2016-08-15 DIAGNOSIS — N186 End stage renal disease: Secondary | ICD-10-CM | POA: Diagnosis not present

## 2016-08-15 DIAGNOSIS — Z4822 Encounter for aftercare following kidney transplant: Secondary | ICD-10-CM | POA: Diagnosis not present

## 2016-08-15 DIAGNOSIS — I120 Hypertensive chronic kidney disease with stage 5 chronic kidney disease or end stage renal disease: Secondary | ICD-10-CM | POA: Diagnosis not present

## 2016-08-19 DIAGNOSIS — D899 Disorder involving the immune mechanism, unspecified: Secondary | ICD-10-CM | POA: Diagnosis not present

## 2016-08-19 DIAGNOSIS — Z940 Kidney transplant status: Secondary | ICD-10-CM | POA: Diagnosis not present

## 2016-08-28 DIAGNOSIS — Z7982 Long term (current) use of aspirin: Secondary | ICD-10-CM | POA: Diagnosis not present

## 2016-08-28 DIAGNOSIS — D899 Disorder involving the immune mechanism, unspecified: Secondary | ICD-10-CM | POA: Diagnosis not present

## 2016-08-28 DIAGNOSIS — Z9889 Other specified postprocedural states: Secondary | ICD-10-CM | POA: Diagnosis not present

## 2016-08-28 DIAGNOSIS — I2510 Atherosclerotic heart disease of native coronary artery without angina pectoris: Secondary | ICD-10-CM | POA: Diagnosis not present

## 2016-08-28 DIAGNOSIS — I4891 Unspecified atrial fibrillation: Secondary | ICD-10-CM | POA: Diagnosis not present

## 2016-08-28 DIAGNOSIS — Z6837 Body mass index (BMI) 37.0-37.9, adult: Secondary | ICD-10-CM | POA: Diagnosis not present

## 2016-08-28 DIAGNOSIS — Z992 Dependence on renal dialysis: Secondary | ICD-10-CM | POA: Diagnosis not present

## 2016-08-28 DIAGNOSIS — Z955 Presence of coronary angioplasty implant and graft: Secondary | ICD-10-CM | POA: Diagnosis not present

## 2016-08-28 DIAGNOSIS — I10 Essential (primary) hypertension: Secondary | ICD-10-CM | POA: Diagnosis not present

## 2016-08-28 DIAGNOSIS — E212 Other hyperparathyroidism: Secondary | ICD-10-CM | POA: Diagnosis not present

## 2016-08-28 DIAGNOSIS — R972 Elevated prostate specific antigen [PSA]: Secondary | ICD-10-CM | POA: Diagnosis not present

## 2016-08-28 DIAGNOSIS — T8619 Other complication of kidney transplant: Secondary | ICD-10-CM | POA: Diagnosis not present

## 2016-08-28 DIAGNOSIS — Z5181 Encounter for therapeutic drug level monitoring: Secondary | ICD-10-CM | POA: Diagnosis not present

## 2016-08-28 DIAGNOSIS — Z4822 Encounter for aftercare following kidney transplant: Secondary | ICD-10-CM | POA: Diagnosis not present

## 2016-08-28 DIAGNOSIS — Z8546 Personal history of malignant neoplasm of prostate: Secondary | ICD-10-CM | POA: Diagnosis not present

## 2016-08-28 DIAGNOSIS — I120 Hypertensive chronic kidney disease with stage 5 chronic kidney disease or end stage renal disease: Secondary | ICD-10-CM | POA: Diagnosis not present

## 2016-08-28 DIAGNOSIS — E785 Hyperlipidemia, unspecified: Secondary | ICD-10-CM | POA: Diagnosis not present

## 2016-08-28 DIAGNOSIS — Z79899 Other long term (current) drug therapy: Secondary | ICD-10-CM | POA: Diagnosis not present

## 2016-08-28 DIAGNOSIS — R399 Unspecified symptoms and signs involving the genitourinary system: Secondary | ICD-10-CM | POA: Diagnosis not present

## 2016-08-28 DIAGNOSIS — Z940 Kidney transplant status: Secondary | ICD-10-CM | POA: Diagnosis not present

## 2016-08-28 DIAGNOSIS — N186 End stage renal disease: Secondary | ICD-10-CM | POA: Diagnosis not present

## 2016-08-28 DIAGNOSIS — R350 Frequency of micturition: Secondary | ICD-10-CM | POA: Diagnosis not present

## 2016-09-07 DIAGNOSIS — N401 Enlarged prostate with lower urinary tract symptoms: Secondary | ICD-10-CM | POA: Diagnosis not present

## 2016-09-07 DIAGNOSIS — N210 Calculus in bladder: Secondary | ICD-10-CM | POA: Diagnosis not present

## 2016-09-07 DIAGNOSIS — N400 Enlarged prostate without lower urinary tract symptoms: Secondary | ICD-10-CM | POA: Diagnosis not present

## 2016-09-25 DIAGNOSIS — Z7952 Long term (current) use of systemic steroids: Secondary | ICD-10-CM | POA: Diagnosis not present

## 2016-09-25 DIAGNOSIS — D8989 Other specified disorders involving the immune mechanism, not elsewhere classified: Secondary | ICD-10-CM | POA: Diagnosis not present

## 2016-09-25 DIAGNOSIS — I120 Hypertensive chronic kidney disease with stage 5 chronic kidney disease or end stage renal disease: Secondary | ICD-10-CM | POA: Diagnosis not present

## 2016-09-25 DIAGNOSIS — Z940 Kidney transplant status: Secondary | ICD-10-CM | POA: Diagnosis not present

## 2016-09-25 DIAGNOSIS — N186 End stage renal disease: Secondary | ICD-10-CM | POA: Diagnosis not present

## 2016-09-25 DIAGNOSIS — D631 Anemia in chronic kidney disease: Secondary | ICD-10-CM | POA: Diagnosis not present

## 2016-09-25 DIAGNOSIS — Z792 Long term (current) use of antibiotics: Secondary | ICD-10-CM | POA: Diagnosis not present

## 2016-09-25 DIAGNOSIS — Z79899 Other long term (current) drug therapy: Secondary | ICD-10-CM | POA: Diagnosis not present

## 2016-09-25 DIAGNOSIS — D649 Anemia, unspecified: Secondary | ICD-10-CM | POA: Diagnosis not present

## 2016-09-25 DIAGNOSIS — Z4822 Encounter for aftercare following kidney transplant: Secondary | ICD-10-CM | POA: Diagnosis not present

## 2016-09-25 DIAGNOSIS — Z6836 Body mass index (BMI) 36.0-36.9, adult: Secondary | ICD-10-CM | POA: Diagnosis not present

## 2016-09-25 DIAGNOSIS — I10 Essential (primary) hypertension: Secondary | ICD-10-CM | POA: Diagnosis not present

## 2016-09-25 DIAGNOSIS — R351 Nocturia: Secondary | ICD-10-CM | POA: Diagnosis not present

## 2016-09-25 DIAGNOSIS — D899 Disorder involving the immune mechanism, unspecified: Secondary | ICD-10-CM | POA: Diagnosis not present

## 2016-09-25 DIAGNOSIS — R609 Edema, unspecified: Secondary | ICD-10-CM | POA: Diagnosis not present

## 2016-09-25 DIAGNOSIS — I2510 Atherosclerotic heart disease of native coronary artery without angina pectoris: Secondary | ICD-10-CM | POA: Diagnosis not present

## 2016-09-25 DIAGNOSIS — I4891 Unspecified atrial fibrillation: Secondary | ICD-10-CM | POA: Diagnosis not present

## 2016-09-25 DIAGNOSIS — R350 Frequency of micturition: Secondary | ICD-10-CM | POA: Diagnosis not present

## 2016-09-25 DIAGNOSIS — E785 Hyperlipidemia, unspecified: Secondary | ICD-10-CM | POA: Diagnosis not present

## 2016-09-25 DIAGNOSIS — Z7982 Long term (current) use of aspirin: Secondary | ICD-10-CM | POA: Diagnosis not present

## 2016-10-23 DIAGNOSIS — R351 Nocturia: Secondary | ICD-10-CM | POA: Diagnosis not present

## 2016-10-23 DIAGNOSIS — R350 Frequency of micturition: Secondary | ICD-10-CM | POA: Diagnosis not present

## 2016-10-23 DIAGNOSIS — D899 Disorder involving the immune mechanism, unspecified: Secondary | ICD-10-CM | POA: Diagnosis not present

## 2016-10-23 DIAGNOSIS — I10 Essential (primary) hypertension: Secondary | ICD-10-CM | POA: Diagnosis not present

## 2016-10-23 DIAGNOSIS — Z4822 Encounter for aftercare following kidney transplant: Secondary | ICD-10-CM | POA: Diagnosis not present

## 2016-10-23 DIAGNOSIS — R609 Edema, unspecified: Secondary | ICD-10-CM | POA: Diagnosis not present

## 2016-10-23 DIAGNOSIS — E212 Other hyperparathyroidism: Secondary | ICD-10-CM | POA: Diagnosis not present

## 2016-10-23 DIAGNOSIS — D649 Anemia, unspecified: Secondary | ICD-10-CM | POA: Diagnosis not present

## 2016-10-23 DIAGNOSIS — Z792 Long term (current) use of antibiotics: Secondary | ICD-10-CM | POA: Diagnosis not present

## 2016-10-23 DIAGNOSIS — Z940 Kidney transplant status: Secondary | ICD-10-CM | POA: Diagnosis not present

## 2016-10-23 DIAGNOSIS — Z7952 Long term (current) use of systemic steroids: Secondary | ICD-10-CM | POA: Diagnosis not present

## 2016-10-23 DIAGNOSIS — E785 Hyperlipidemia, unspecified: Secondary | ICD-10-CM | POA: Diagnosis not present

## 2016-10-23 DIAGNOSIS — Z79899 Other long term (current) drug therapy: Secondary | ICD-10-CM | POA: Diagnosis not present

## 2016-11-20 DIAGNOSIS — Z792 Long term (current) use of antibiotics: Secondary | ICD-10-CM | POA: Diagnosis not present

## 2016-11-20 DIAGNOSIS — I10 Essential (primary) hypertension: Secondary | ICD-10-CM | POA: Diagnosis not present

## 2016-11-20 DIAGNOSIS — Z79899 Other long term (current) drug therapy: Secondary | ICD-10-CM | POA: Diagnosis not present

## 2016-11-20 DIAGNOSIS — T8619 Other complication of kidney transplant: Secondary | ICD-10-CM | POA: Diagnosis not present

## 2016-11-20 DIAGNOSIS — R351 Nocturia: Secondary | ICD-10-CM | POA: Diagnosis not present

## 2016-11-20 DIAGNOSIS — E785 Hyperlipidemia, unspecified: Secondary | ICD-10-CM | POA: Diagnosis not present

## 2016-11-20 DIAGNOSIS — R609 Edema, unspecified: Secondary | ICD-10-CM | POA: Diagnosis not present

## 2016-11-20 DIAGNOSIS — Z23 Encounter for immunization: Secondary | ICD-10-CM | POA: Diagnosis not present

## 2016-11-20 DIAGNOSIS — D649 Anemia, unspecified: Secondary | ICD-10-CM | POA: Diagnosis not present

## 2016-11-20 DIAGNOSIS — D899 Disorder involving the immune mechanism, unspecified: Secondary | ICD-10-CM | POA: Diagnosis not present

## 2016-11-20 DIAGNOSIS — Z940 Kidney transplant status: Secondary | ICD-10-CM | POA: Diagnosis not present

## 2016-11-20 DIAGNOSIS — Z5181 Encounter for therapeutic drug level monitoring: Secondary | ICD-10-CM | POA: Diagnosis not present

## 2016-11-20 DIAGNOSIS — Z4822 Encounter for aftercare following kidney transplant: Secondary | ICD-10-CM | POA: Diagnosis not present

## 2016-11-20 DIAGNOSIS — E212 Other hyperparathyroidism: Secondary | ICD-10-CM | POA: Diagnosis not present

## 2016-12-18 DIAGNOSIS — R351 Nocturia: Secondary | ICD-10-CM | POA: Diagnosis not present

## 2016-12-18 DIAGNOSIS — D8989 Other specified disorders involving the immune mechanism, not elsewhere classified: Secondary | ICD-10-CM | POA: Diagnosis not present

## 2016-12-18 DIAGNOSIS — R350 Frequency of micturition: Secondary | ICD-10-CM | POA: Diagnosis not present

## 2016-12-18 DIAGNOSIS — E785 Hyperlipidemia, unspecified: Secondary | ICD-10-CM | POA: Diagnosis not present

## 2016-12-18 DIAGNOSIS — D649 Anemia, unspecified: Secondary | ICD-10-CM | POA: Diagnosis not present

## 2016-12-18 DIAGNOSIS — E212 Other hyperparathyroidism: Secondary | ICD-10-CM | POA: Diagnosis not present

## 2016-12-18 DIAGNOSIS — R609 Edema, unspecified: Secondary | ICD-10-CM | POA: Diagnosis not present

## 2016-12-18 DIAGNOSIS — Z792 Long term (current) use of antibiotics: Secondary | ICD-10-CM | POA: Diagnosis not present

## 2016-12-18 DIAGNOSIS — Z79899 Other long term (current) drug therapy: Secondary | ICD-10-CM | POA: Diagnosis not present

## 2016-12-18 DIAGNOSIS — Z4822 Encounter for aftercare following kidney transplant: Secondary | ICD-10-CM | POA: Diagnosis not present

## 2016-12-18 DIAGNOSIS — I10 Essential (primary) hypertension: Secondary | ICD-10-CM | POA: Diagnosis not present

## 2016-12-18 DIAGNOSIS — Z940 Kidney transplant status: Secondary | ICD-10-CM | POA: Diagnosis not present

## 2016-12-25 DIAGNOSIS — Z8546 Personal history of malignant neoplasm of prostate: Secondary | ICD-10-CM | POA: Diagnosis not present

## 2016-12-25 DIAGNOSIS — Z940 Kidney transplant status: Secondary | ICD-10-CM | POA: Diagnosis not present

## 2016-12-25 DIAGNOSIS — N400 Enlarged prostate without lower urinary tract symptoms: Secondary | ICD-10-CM | POA: Diagnosis not present

## 2017-06-08 ENCOUNTER — Observation Stay (HOSPITAL_COMMUNITY): Payer: Medicare Other

## 2017-06-08 ENCOUNTER — Other Ambulatory Visit: Payer: Self-pay

## 2017-06-08 ENCOUNTER — Observation Stay (HOSPITAL_COMMUNITY)
Admission: EM | Admit: 2017-06-08 | Discharge: 2017-06-09 | Disposition: A | Discharge status: Home / Self Care | Payer: Medicare Other | Attending: Family Medicine | Admitting: Family Medicine

## 2017-06-08 ENCOUNTER — Emergency Department (HOSPITAL_BASED_OUTPATIENT_CLINIC_OR_DEPARTMENT_OTHER)
Admit: 2017-06-08 | Discharge: 2017-06-08 | Disposition: A | Discharge status: Home / Self Care | Payer: Medicare Other | Attending: Emergency Medicine | Admitting: Emergency Medicine

## 2017-06-08 ENCOUNTER — Encounter (HOSPITAL_COMMUNITY): Payer: Self-pay | Admitting: Emergency Medicine

## 2017-06-08 ENCOUNTER — Ambulatory Visit: Payer: Medicare Other

## 2017-06-08 DIAGNOSIS — I2510 Atherosclerotic heart disease of native coronary artery without angina pectoris: Secondary | ICD-10-CM | POA: Diagnosis not present

## 2017-06-08 DIAGNOSIS — N186 End stage renal disease: Secondary | ICD-10-CM | POA: Diagnosis not present

## 2017-06-08 DIAGNOSIS — Z8546 Personal history of malignant neoplasm of prostate: Secondary | ICD-10-CM | POA: Diagnosis not present

## 2017-06-08 DIAGNOSIS — Z992 Dependence on renal dialysis: Secondary | ICD-10-CM | POA: Insufficient documentation

## 2017-06-08 DIAGNOSIS — Z7982 Long term (current) use of aspirin: Secondary | ICD-10-CM | POA: Diagnosis not present

## 2017-06-08 DIAGNOSIS — I4891 Unspecified atrial fibrillation: Secondary | ICD-10-CM | POA: Diagnosis not present

## 2017-06-08 DIAGNOSIS — M79609 Pain in unspecified limb: Secondary | ICD-10-CM | POA: Diagnosis not present

## 2017-06-08 DIAGNOSIS — N400 Enlarged prostate without lower urinary tract symptoms: Secondary | ICD-10-CM | POA: Diagnosis not present

## 2017-06-08 DIAGNOSIS — Z79899 Other long term (current) drug therapy: Secondary | ICD-10-CM | POA: Diagnosis not present

## 2017-06-08 DIAGNOSIS — Z888 Allergy status to other drugs, medicaments and biological substances status: Secondary | ICD-10-CM | POA: Diagnosis not present

## 2017-06-08 DIAGNOSIS — Z940 Kidney transplant status: Secondary | ICD-10-CM | POA: Diagnosis not present

## 2017-06-08 DIAGNOSIS — E785 Hyperlipidemia, unspecified: Secondary | ICD-10-CM | POA: Insufficient documentation

## 2017-06-08 DIAGNOSIS — N2581 Secondary hyperparathyroidism of renal origin: Secondary | ICD-10-CM | POA: Diagnosis not present

## 2017-06-08 DIAGNOSIS — I82621 Acute embolism and thrombosis of deep veins of right upper extremity: Secondary | ICD-10-CM | POA: Diagnosis present

## 2017-06-08 DIAGNOSIS — M7989 Other specified soft tissue disorders: Secondary | ICD-10-CM | POA: Diagnosis not present

## 2017-06-08 DIAGNOSIS — N183 Chronic kidney disease, stage 3 unspecified: Secondary | ICD-10-CM | POA: Diagnosis present

## 2017-06-08 DIAGNOSIS — D631 Anemia in chronic kidney disease: Secondary | ICD-10-CM | POA: Insufficient documentation

## 2017-06-08 DIAGNOSIS — I252 Old myocardial infarction: Secondary | ICD-10-CM | POA: Diagnosis not present

## 2017-06-08 DIAGNOSIS — I82629 Acute embolism and thrombosis of deep veins of unspecified upper extremity: Secondary | ICD-10-CM | POA: Diagnosis present

## 2017-06-08 DIAGNOSIS — G4733 Obstructive sleep apnea (adult) (pediatric): Secondary | ICD-10-CM | POA: Diagnosis not present

## 2017-06-08 DIAGNOSIS — Z933 Colostomy status: Secondary | ICD-10-CM | POA: Diagnosis not present

## 2017-06-08 DIAGNOSIS — N189 Chronic kidney disease, unspecified: Secondary | ICD-10-CM

## 2017-06-08 DIAGNOSIS — Z955 Presence of coronary angioplasty implant and graft: Secondary | ICD-10-CM | POA: Insufficient documentation

## 2017-06-08 DIAGNOSIS — I82409 Acute embolism and thrombosis of unspecified deep veins of unspecified lower extremity: Secondary | ICD-10-CM | POA: Diagnosis present

## 2017-06-08 DIAGNOSIS — I5032 Chronic diastolic (congestive) heart failure: Secondary | ICD-10-CM | POA: Diagnosis not present

## 2017-06-08 LAB — COMPREHENSIVE METABOLIC PANEL
ALT: 15 U/L — AB (ref 17–63)
ANION GAP: 6 (ref 5–15)
AST: 15 U/L (ref 15–41)
Albumin: 3.6 g/dL (ref 3.5–5.0)
Alkaline Phosphatase: 137 U/L — ABNORMAL HIGH (ref 38–126)
BUN: 16 mg/dL (ref 6–20)
CHLORIDE: 112 mmol/L — AB (ref 101–111)
CO2: 22 mmol/L (ref 22–32)
CREATININE: 1.72 mg/dL — AB (ref 0.61–1.24)
Calcium: 11.3 mg/dL — ABNORMAL HIGH (ref 8.9–10.3)
GFR calc Af Amer: 46 mL/min — ABNORMAL LOW (ref >60)
GFR, EST NON AFRICAN AMERICAN: 39 mL/min — AB (ref >60)
Glucose, Bld: 119 mg/dL — ABNORMAL HIGH (ref 65–99)
Potassium: 4.1 mmol/L (ref 3.5–5.1)
Sodium: 140 mmol/L (ref 135–145)
Total Bilirubin: 0.4 mg/dL (ref 0.3–1.2)
Total Protein: 6.7 g/dL (ref 6.5–8.1)

## 2017-06-08 LAB — CBC WITH DIFFERENTIAL/PLATELET
Abs Immature Granulocytes: 0 10*3/uL (ref 0.0–0.1)
BASOS PCT: 0 %
Basophils Absolute: 0 10*3/uL (ref 0.0–0.1)
EOS ABS: 0.1 10*3/uL (ref 0.0–0.7)
Eosinophils Relative: 1 %
HEMATOCRIT: 39.8 % (ref 39.0–52.0)
Hemoglobin: 12.8 g/dL — ABNORMAL LOW (ref 13.0–17.0)
IMMATURE GRANULOCYTES: 0 %
LYMPHS ABS: 0.9 10*3/uL (ref 0.7–4.0)
Lymphocytes Relative: 10 %
MCH: 29.4 pg (ref 26.0–34.0)
MCHC: 32.2 g/dL (ref 30.0–36.0)
MCV: 91.3 fL (ref 78.0–100.0)
Monocytes Absolute: 0.8 10*3/uL (ref 0.1–1.0)
Monocytes Relative: 9 %
NEUTROS PCT: 80 %
Neutro Abs: 7.3 10*3/uL (ref 1.7–7.7)
PLATELETS: 158 10*3/uL (ref 150–400)
RBC: 4.36 MIL/uL (ref 4.22–5.81)
RDW: 12.7 % (ref 11.5–15.5)
WBC: 9.1 10*3/uL (ref 4.0–10.5)

## 2017-06-08 LAB — BRAIN NATRIURETIC PEPTIDE: B NATRIURETIC PEPTIDE 5: 58.9 pg/mL (ref 0.0–100.0)

## 2017-06-08 LAB — PHOSPHORUS: PHOSPHORUS: 2.3 mg/dL — AB (ref 2.5–4.6)

## 2017-06-08 LAB — I-STAT CG4 LACTIC ACID, ED: LACTIC ACID, VENOUS: 1.07 mmol/L (ref 0.5–1.9)

## 2017-06-08 LAB — MAGNESIUM: Magnesium: 1.7 mg/dL (ref 1.7–2.4)

## 2017-06-08 MED ORDER — POLYETHYLENE GLYCOL 3350 17 G PO PACK: 17.0000 g | PACK | Freq: Every day | ORAL | Status: DC | PRN

## 2017-06-08 MED ORDER — ATORVASTATIN CALCIUM 20 MG PO TABS
20.0000 mg | ORAL_TABLET | Freq: Every evening | ORAL | Status: DC
  Filled 2017-06-08: qty 1

## 2017-06-08 MED ORDER — SODIUM CHLORIDE 0.9 % IV SOLN
INTRAVENOUS | Status: DC
  Administered 2017-06-08: 22:00:00 via INTRAVENOUS

## 2017-06-08 MED ORDER — ASPIRIN EC 81 MG PO TBEC
81.0000 mg | DELAYED_RELEASE_TABLET | Freq: Every day | ORAL | Status: DC
  Filled 2017-06-08: qty 1

## 2017-06-08 MED ORDER — OXYCODONE-ACETAMINOPHEN 5-325 MG PO TABS: 1.0000 | ORAL_TABLET | ORAL | Status: DC | PRN

## 2017-06-08 MED ORDER — PREDNISONE 5 MG PO TABS
5.0000 mg | ORAL_TABLET | Freq: Every day | ORAL | Status: DC
  Filled 2017-06-08 (×3): qty 1

## 2017-06-08 MED ORDER — ONDANSETRON HCL 4 MG/2ML IJ SOLN
4.0000 mg | Freq: Four times a day (QID) | INTRAMUSCULAR | Status: DC | PRN
Start: 2017-06-08 — End: 2017-06-09

## 2017-06-08 MED ORDER — RENA-VITE PO TABS: 1.0000 | ORAL_TABLET | Freq: Every day | ORAL | Status: DC

## 2017-06-08 MED ORDER — HEPARIN (PORCINE) IN NACL 100-0.45 UNIT/ML-% IJ SOLN
1700.0000 [IU]/h | INTRAMUSCULAR | Status: AC
  Administered 2017-06-08 – 2017-06-09 (×2): 1700 [IU]/h via INTRAVENOUS
  Filled 2017-06-08 (×2): qty 250

## 2017-06-08 MED ORDER — HEPARIN BOLUS VIA INFUSION
6000.0000 [IU] | Freq: Once | INTRAVENOUS | Status: AC
  Administered 2017-06-08: 6000 [IU] via INTRAVENOUS
  Filled 2017-06-08: qty 6000

## 2017-06-08 MED ORDER — ZOLPIDEM TARTRATE 5 MG PO TABS: 5.0000 mg | ORAL_TABLET | Freq: Every evening | ORAL | Status: DC | PRN

## 2017-06-08 MED ORDER — ACETAMINOPHEN 325 MG PO TABS: 650.0000 mg | ORAL_TABLET | Freq: Four times a day (QID) | ORAL | Status: DC | PRN

## 2017-06-08 MED ORDER — MYCOPHENOLATE SODIUM 180 MG PO TBEC
360.0000 mg | DELAYED_RELEASE_TABLET | Freq: Two times a day (BID) | ORAL | Status: DC
  Filled 2017-06-08: qty 2

## 2017-06-08 MED ORDER — OMEGA-3-ACID ETHYL ESTERS 1 G PO CAPS
1.0000 g | ORAL_CAPSULE | Freq: Every day | ORAL | Status: DC
  Filled 2017-06-08: qty 1

## 2017-06-08 MED ORDER — HYDROCORTISONE NA SUCCINATE PF 100 MG IJ SOLR
50.0000 mg | Freq: Once | INTRAMUSCULAR | Status: AC
  Administered 2017-06-08: 50 mg via INTRAVENOUS
  Filled 2017-06-08: qty 2

## 2017-06-08 MED ORDER — ACETAMINOPHEN 650 MG RE SUPP: 650.0000 mg | Freq: Four times a day (QID) | RECTAL | Status: DC | PRN

## 2017-06-08 MED ORDER — OXYBUTYNIN CHLORIDE 5 MG PO TABS
5.0000 mg | ORAL_TABLET | Freq: Every day | ORAL | Status: DC
  Filled 2017-06-08: qty 1

## 2017-06-08 MED ORDER — ONDANSETRON HCL 4 MG PO TABS: 4.0000 mg | ORAL_TABLET | Freq: Four times a day (QID) | ORAL | Status: DC | PRN

## 2017-06-08 MED ORDER — HYDRALAZINE HCL 20 MG/ML IJ SOLN: 5.0000 mg | INTRAMUSCULAR | Status: DC | PRN

## 2017-06-08 MED ORDER — NITROGLYCERIN 0.4 MG SL SUBL: 0.4000 mg | SUBLINGUAL_TABLET | SUBLINGUAL | Status: DC | PRN

## 2017-06-08 MED ORDER — TAMSULOSIN HCL 0.4 MG PO CAPS
0.4000 mg | ORAL_CAPSULE | Freq: Every day | ORAL | Status: DC
  Filled 2017-06-08: qty 1

## 2017-06-08 MED ORDER — LABETALOL HCL 100 MG PO TABS
50.0000 mg | ORAL_TABLET | Freq: Every day | ORAL | Status: DC
  Filled 2017-06-08: qty 1

## 2017-06-08 MED ORDER — MORPHINE SULFATE (PF) 4 MG/ML IV SOLN: 2.0000 mg | INTRAVENOUS | Status: DC | PRN

## 2017-06-08 MED ORDER — CINACALCET HCL 30 MG PO TABS: 90.0000 mg | ORAL_TABLET | Freq: Every evening | ORAL | Status: DC

## 2017-06-08 MED ORDER — TACROLIMUS 1 MG PO CAPS
6.0000 mg | ORAL_CAPSULE | Freq: Two times a day (BID) | ORAL | Status: DC
  Filled 2017-06-08 (×2): qty 6

## 2017-06-08 NOTE — ED Notes (Signed)
MD at bedside. 

## 2017-06-08 NOTE — ED Triage Notes (Signed)
Patient to ED with new redness, swelling, and pain to fistula to L arm. Fistula hasn't been used in 15 months (no longer on dialysis s/p kidney transplant). Thrill and bruit present and strong. Patient denies fevers/chills.

## 2017-06-08 NOTE — Progress Notes (Signed)
ANTICOAGULATION CONSULT NOTE - Initial Consult  Pharmacy Consult for Heparin Indication: DVT  Allergies  Allergen Reactions  . Tramadol Nausea Only   Patient Measurements: Height: 5'11" Weight: 120kg (02/19/17) Heparin Dosing Weight: 102kg  Vital Signs: Temp: 98.4 F (36.9 C) (05/28 1219) Temp Source: Oral (05/28 1219) BP: 144/83 (05/28 1504) Pulse Rate: 76 (05/28 1504)  Labs: Recent Labs    06/08/17 1240  HGB 12.8*  HCT 39.8  PLT 158  CREATININE 1.72*   CrCl cannot be calculated (Unknown ideal weight.).  Assessment: 77 yoM presenting with redness, swelling, and pain in L arm fistula. RUE venous duplex positive for acute DVT. Pharmacy consulted to dose IV heparin. No AC PTA. Hgb 12.8, pltc WNL. No bleeding noted. Pt is ESRD on HD.  Goal of Therapy:  Heparin level 0.3-0.7 units/ml Monitor platelets by anticoagulation protocol: Yes   Plan:  Give heparin 6000 units bolus x 1 Start heparin infusion at 1700 units/hr Check heparin level in 8 hours Daily heparin level and CBC Monitor for s/sx of bleeding  Erin N. Gerarda Fraction, PharmD PGY1 Pharmacy Resident Pager: [phone] 06/08/2017,7:23 PM

## 2017-06-08 NOTE — Progress Notes (Signed)
*  Preliminary Results* Right upper extremity venous duplex completed. Right upper extremity is positive for acute deep and superficial vein thrombosis involving the right subclavian, right cephalic, and the arteriovenous fistula in the right upper arm. The thrombus in the right subclavian vein appears to be slightly mobile.  Preliminary results discussed with Dr. Sherry Ruffing.  06/08/2017 6:51 PM  Maudry Mayhew, BS, RVT, RDCS, RDMS

## 2017-06-08 NOTE — ED Notes (Signed)
Pt alert and in room with family @ bedside VS documented Call light within reach
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
## 2017-06-08 NOTE — ED Notes (Signed)
Vascular tech is aware of patient 
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
## 2017-06-08 NOTE — ED Notes (Signed)
Pt transported to vascular.  °

## 2017-06-08 NOTE — ED Notes (Signed)
Pt returned from imaging.

## 2017-06-09 DIAGNOSIS — Z940 Kidney transplant status: Secondary | ICD-10-CM | POA: Diagnosis not present

## 2017-06-09 DIAGNOSIS — N183 Chronic kidney disease, stage 3 (moderate): Secondary | ICD-10-CM | POA: Diagnosis not present

## 2017-06-09 DIAGNOSIS — D631 Anemia in chronic kidney disease: Secondary | ICD-10-CM | POA: Diagnosis not present

## 2017-06-09 DIAGNOSIS — I5032 Chronic diastolic (congestive) heart failure: Secondary | ICD-10-CM | POA: Diagnosis not present

## 2017-06-09 DIAGNOSIS — I25119 Atherosclerotic heart disease of native coronary artery with unspecified angina pectoris: Secondary | ICD-10-CM

## 2017-06-09 DIAGNOSIS — I82621 Acute embolism and thrombosis of deep veins of right upper extremity: Secondary | ICD-10-CM | POA: Diagnosis not present

## 2017-06-09 LAB — BASIC METABOLIC PANEL
Anion gap: 8 (ref 5–15)
BUN: 16 mg/dL (ref 6–20)
CHLORIDE: 113 mmol/L — AB (ref 101–111)
CO2: 21 mmol/L — AB (ref 22–32)
CREATININE: 1.59 mg/dL — AB (ref 0.61–1.24)
Calcium: 9.9 mg/dL (ref 8.9–10.3)
GFR calc Af Amer: 50 mL/min — ABNORMAL LOW (ref >60)
GFR calc non Af Amer: 43 mL/min — ABNORMAL LOW (ref >60)
GLUCOSE: 112 mg/dL — AB (ref 65–99)
POTASSIUM: 4.2 mmol/L (ref 3.5–5.1)
SODIUM: 142 mmol/L (ref 135–145)

## 2017-06-09 LAB — CBC
HEMATOCRIT: 36.4 % — AB (ref 39.0–52.0)
Hemoglobin: 11.5 g/dL — ABNORMAL LOW (ref 13.0–17.0)
MCH: 29.1 pg (ref 26.0–34.0)
MCHC: 31.6 g/dL (ref 30.0–36.0)
MCV: 92.2 fL (ref 78.0–100.0)
Platelets: 150 10*3/uL (ref 150–400)
RBC: 3.95 MIL/uL — AB (ref 4.22–5.81)
RDW: 12.8 % (ref 11.5–15.5)
WBC: 8.5 10*3/uL (ref 4.0–10.5)

## 2017-06-09 LAB — HIV ANTIBODY (ROUTINE TESTING W REFLEX): HIV Screen 4th Generation wRfx: NONREACTIVE

## 2017-06-09 LAB — CORTISOL-AM, BLOOD: Cortisol - AM: 14.3 ug/dL (ref 6.7–22.6)

## 2017-06-09 LAB — HEPARIN LEVEL (UNFRACTIONATED): Heparin Unfractionated: 0.65 IU/mL (ref 0.30–0.70)

## 2017-06-09 MED ORDER — APIXABAN 2.5 MG PO TABS
2.5000 mg | ORAL_TABLET | Freq: Two times a day (BID) | ORAL | Status: DC
  Administered 2017-06-09: 2.5 mg via ORAL
  Filled 2017-06-09: qty 1

## 2017-06-09 MED ORDER — ELIQUIS 5 MG VTE STARTER PACK: ORAL_TABLET | ORAL | 0 refills | Status: AC

## 2017-06-09 MED ORDER — APIXABAN 5 MG PO TABS: 5.0000 mg | ORAL_TABLET | Freq: Two times a day (BID) | ORAL | Status: DC

## 2017-06-09 NOTE — Care Management Note (Signed)
Case Management Note  Patient Details  Name: [name] MRN: [id number] Date of Birth: 07-Oct-1950  Subjective/Objective:                  67 y.o. male with medical history significant of ESRD, s/p of kidney transplantation, CKD-3, hyperlipidemia, OSA not on CPAP, CAD, stent placement, C. difficile, prostate cancer (s/p of radiation seeds implant), dCHF, who presents with right arm swelling and pain.  From home with spouse.  Action/Plan: Admit status OBS (ACUTE DVT); anticipate discharge [place].  Expected Discharge Date:  (unknown)               Expected Discharge Plan:  [place]  In-House Referral:     Discharge planning Services  CM Consult  Post Acute Care Choice:    Choice offered to:     DME Arranged:    DME Agency:     HH Arranged:    HH Agency:     Status of Service:  In process, will continue to follow  If discussed at Long Length of Stay Meetings, dates discussed:    Additional Comments: Pt is active at [facility]; PCP: Dr Sherral Hammers; SW: Derald Macleod [phone] (pager).  Please contact [facility] with disposition needs.  Fuller Mandril, RN 06/09/2017, 10:39 AM

## 2017-06-09 NOTE — Progress Notes (Signed)
ANTICOAGULATION CONSULT NOTE - Follow Up Consult  Pharmacy Consult for Apixaban Indication: DVT  Allergies  Allergen Reactions  . Tramadol Nausea Only    Patient Measurements: Weight: 261 lb 7.5 oz (118.6 kg) Heparin Dosing Weight: 102 kg  Vital Signs: Temp: 97.8 F (36.6 C) (05/29 0638) Temp Source: Oral (05/29 8638) BP: 142/81 (05/29 1200) Pulse Rate: 52 (05/29 1200)  Labs: Recent Labs    06/08/17 1240 06/09/17 0414  HGB 12.8* 11.5*  HCT 39.8 36.4*  PLT 158 150  HEPARINUNFRC  --  0.65  CREATININE 1.72* 1.59*    CrCl cannot be calculated (Unknown ideal weight.).  Assessment: 69 yoM presenting with redness, swelling, and pain in L arm fistula. RUE venous duplex positive for acute DVT. Pharmacy consulted to dose Apixaban.. No bleeding noted. Pt is ESRD on HD.    Plan:  Start Apixaban 2.5 mg po bid. Stop heparin infusion 1 hour after 1st po apixiban dose.  Corinda Gubler 06/09/2017,1:53 PM

## 2017-06-09 NOTE — Progress Notes (Signed)
[place] for Heparin Indication: DVT  Allergies  Allergen Reactions  . Tramadol Nausea Only   Patient Measurements: Height: 5'11" Weight: 120kg (02/19/17) Heparin Dosing Weight: 102kg  Vital Signs: Temp: 98.6 F (37 C) (05/28 2052) Temp Source: Oral (05/28 2052) BP: 139/79 (05/29 0500) Pulse Rate: 48 (05/29 0500)  Labs: Recent Labs    06/08/17 1240 06/09/17 0414  HGB 12.8* 11.5*  HCT 39.8 36.4*  PLT 158 150  HEPARINUNFRC  --  0.65  CREATININE 1.72*  --    CrCl cannot be calculated (Unknown ideal weight.).  Assessment: 2 yoM presenting with redness, swelling, and pain in L arm fistula. RUE venous duplex positive for acute DVT. Pharmacy consulted to dose IV heparin. No AC PTA. Hgb 12.8, pltc WNL. No bleeding noted. Pt is ESRD on HD. Initial heparin level therapeutic  Goal of Therapy:  Heparin level 0.3-0.7 units/ml Monitor platelets by anticoagulation protocol: Yes   Plan:  Cont heparin infusion at 1700 units/hr Check heparin level in 6 hours to confirm Daily heparin level and CBC Monitor for s/sx of bleeding  Thanks for allowing pharmacy to be a part of this patient's care.  Excell Seltzer, PharmD Clinical Pharmacist  06/09/2017,5:11 AM

## 2017-06-09 NOTE — ED Notes (Signed)
Per Dr. Blaine Hamper pt's heparin stopped for 10-15 mins for heparin level and other morning lab draws.

## 2017-06-09 NOTE — ED Notes (Signed)
Pt alert and resting with family @ bedside Call light within reach

## 2017-06-09 NOTE — ED Notes (Signed)
EKG complete.

## 2017-06-09 NOTE — ED Notes (Signed)
Pt resting with family @ bedside Call light within reach

## 2017-06-09 NOTE — Discharge Summary (Signed)
Physician Discharge Summary  SHAYON TROMPETER LFY:[id number] DOB: [birth year] DOA: 06/08/2017  PCP: Carlena Hurl, PA-C  Admit date: 06/08/2017 Discharge date: 06/09/2017  Admitted From: Home Disposition: Home   Recommendations for Outpatient Follow-up:  1. Follow up with PCP in 1-2 weeks with repeat BMP to monitor CrCl, and CBC.  Home Health: None new Equipment/Devices: None new Discharge Condition: Stable CODE STATUS: Full Diet recommendation: Heart healthy  Brief/Interim Summary: [name] is a 67 y.o. male with medical history significant of ESRD, s/p of kidney transplantation, CKD-3, hyperlipidemia, OSA not on CPAP, CAD, stent placement, C. difficile, prostate cancer (s/p of radiation seeds implant), dCHF, who presented with right arm swelling and pain.  Pt has AVF in right arm which is no long used after he had kidney transplantation 02/2016. Pt states that he developed swelling and pain in the right arm over AVF area, which has been going for more than 4 days. Venous doppler of right arm showed acutedeep and superficial vein thrombosisinvolving the right subclavian, right cephalic, and the arteriovenous fistula in the right upper arm. The thrombus in the right subclavian vein appears to be slightly mobile. Pt was observed overnight, started on heparin gtt and eventually converted to eliquis after discussions regarding anticoagulation options.   Discharge Diagnoses:  Principal Problem:   Acute deep vein thrombosis (DVT) of upper extremity-right arm Active Problems:   CAD (coronary artery disease)   Anemia in chronic kidney disease   CKD (chronic kidney disease), stage III (HCC)   Kidney transplanted   Hypercalcemia   Chronic diastolic CHF (congestive heart failure) (HCC)   DVT (deep venous thrombosis) (HCC)  Acute subclavian vein DVT involving AVF: Seen on U/S 5/28 indicative of fresh clot. Discussed with vascular surgery, Dr. Scot Dock, who read the ultrasound. No further  management is required aside from anticoagulation.  - Converted heparin to eliquis. Extensive discussion with patient regarding anticoagulant indications in his case with very clear risks of treatment vs. no treatment. He agrees to start oral anticoagulant. Has heard bad stories about coumadin, does not want frequent monitoring, and is worried about medication interactions so he prefers DOAC. Slightly lower GI bleeding in studies favor eliquis. Can take BID medications.  - CrCl based on IBW by my calculation is >/= 19ml/min based on current creatinine. Does not meet weight or age criteria for dose reduction.  - With improvement in erythema despite no antimicrobial, will not start abx as this is more consistent with DVT-related inflammation than cellulitis.   Kidney transplanted: - Continue mycophenolate, prednisone, tacrolimus  CKD (chronic kidney disease), stage III: Kidney function close to baseline.  His baseline creatinine 1.5-1.6. Today creatinine 1.72, BUN 16. - Follow-up renal function at follow up  Hx of CAD (coronary artery disease): No CP - Continue aspirin lipitor, beta-blocker  Anemia in chronic kidney disease: Hemoglobin stable, 12.8. - Recommend monitoring CBC at follow up  Hypercalcemia: Resolved with hydration.    Chronic diastolic CHF: 2D echo 8/52/7782 showed EF 65-70% with grade 1 diastolic dysfunction.  CHF is compensated.   Discharge Instructions Discharge Instructions    Diet - low sodium heart healthy   Complete by:  As directed    Discharge instructions   Complete by:  As directed    - Take eliquis   You were evaluated for blood clot in the veins of the right arm.  - Take eliquis as directed to treat the blood clot. This is the treatment for the blood clot in the arm,  and will take several months. It reduces, but does not completely get rid of, your risk for the clot dislodging and traveling to your lungs. If you notice new shortness of breath, chest pain,  fainting, or any uncontrolled bleeding, seek medical attention right away.   Increase activity slowly   Complete by:  As directed      Allergies as of 06/09/2017      Reactions   Tramadol Nausea Only      Medication List    STOP taking these medications   aspirin EC 81 MG tablet     TAKE these medications   atorvastatin 20 MG tablet Commonly known as:  LIPITOR Take 1 tablet (20 mg total) by mouth daily at 6 PM. What changed:  when to take this   b complex-vitamin c-folic acid 0.8 MG Tabs tablet Take 1 tablet by mouth at bedtime.   ELIQUIS STARTER PACK 5 MG Tabs Take as directed on package: start with two-5mg  tablets twice daily for 7 days. On day 8, switch to one-5mg  tablet twice daily.   FISH OIL PO Take 1 capsule by mouth daily.   furosemide 40 MG tablet Commonly known as:  LASIX Take 40 mg by mouth daily as needed for fluid or edema.   labetalol 100 MG tablet Commonly known as:  NORMODYNE Take 50 mg by mouth daily.   mycophenolate 180 MG EC tablet Commonly known as:  MYFORTIC Take 360 mg by mouth 2 (two) times daily.   nitroGLYCERIN 0.4 MG SL tablet Commonly known as:  NITROSTAT Place 1 tablet (0.4 mg total) under the tongue every 5 (five) minutes as needed for chest pain.   oxybutynin 5 MG tablet Commonly known as:  DITROPAN Take 5 mg by mouth at bedtime.   predniSONE 5 MG tablet Commonly known as:  DELTASONE Take 5 mg by mouth daily.   SENSIPAR 90 MG tablet Generic drug:  cinacalcet Take 1 tablet by mouth every evening.   tacrolimus 1 MG capsule Commonly known as:  PROGRAF Take 6 mg by mouth 2 (two) times daily.   tamsulosin 0.4 MG Caps capsule Commonly known as:  FLOMAX Take 0.4 mg by mouth daily.      Follow-up Information    Tysinger, Camelia Eng, PA-C Follow up.   Specialty:  Family Medicine Contact information: [address] [place] [address] [phone]          Allergies  Allergen Reactions  . Tramadol Nausea Only     Consultations:  Vascular surgery, Dr. Scot Dock by phone  Procedures/Studies: Dg Elbow Complete Right  Result Date: 06/08/2017 CLINICAL DATA:  Initial evaluation for acute pain and swelling at antecubital fossa. EXAM: RIGHT ELBOW - COMPLETE 3+ VIEW COMPARISON:  None. FINDINGS: No acute fracture or dislocation. No appreciable joint effusion, although evaluation limited by patient positioning. Osteoarthritic spurring noted about the elbow. Prominent soft tissue swelling present at the anterior aspect of the distal right arm/antecubital fossa. Scattered calcific densities present within this region, with a ovoid approximate 4 cm calcified lesion at the anterior aspect of the lower right arm. Few small surgical clips noted. IMPRESSION: 1. Soft tissue swelling at the anterior aspect of the right arm/elbow, nonspecific, but could reflect sequelae of acute infection/cellulitis, edema, or possibly postsurgical changes given presence of surgical clips within this region. Superimposed calcifications within this region suspected to be related to prior shunt placement. Correlation with ultrasound and/or cross-sectional imaging may be helpful for further evaluation as clinically warranted. 2. No other  acute osseous abnormality about the elbow. Electronically Signed   By: Jeannine Boga M.D.   On: 06/08/2017 21:23   Subjective: No bleeding, pain in arm is improving. Redness has reduced significantly.   Discharge Exam: BP (!) 153/88 (BP Location: Left Arm)   Pulse 62   Temp 97.8 F (36.6 C) (Oral)   Resp 18   Wt 118.6 kg (261 lb 7.5 oz)   SpO2 98%   BMI 36.47 kg/m   General: Pt is alert, awake, not in acute distress Cardiovascular: RRR, S1/S2 +, no rubs, no gallops Respiratory: CTA bilaterally, no wheezing, no rhonchi Abdominal: Soft, NT, ND, bowel sounds + Extremities: Right upper arm fistula appears swollen with +thrill, mildly tender to palpation with ill-defined erythema. No significant  distal edema.   Labs: BNP (last 3 results) Recent Labs    06/08/17 2300  BNP 45.6   Basic Metabolic Panel: Recent Labs  Lab 06/08/17 1240 06/08/17 2030 06/08/17 2300 06/09/17 0414  NA 140  --   --  142  K 4.1  --   --  4.2  CL 112*  --   --  113*  CO2 22  --   --  21*  GLUCOSE 119*  --   --  112*  BUN 16  --   --  16  CREATININE 1.72*  --   --  1.59*  CALCIUM 11.3*  --   --  9.9  MG  --  1.7  --   --   PHOS  --   --  2.3*  --    Liver Function Tests: Recent Labs  Lab 06/08/17 1240  AST 15  ALT 15*  ALKPHOS 137*  BILITOT 0.4  PROT 6.7  ALBUMIN 3.6   CBC: Recent Labs  Lab 06/08/17 1240 06/09/17 0414  WBC 9.1 8.5  NEUTROABS 7.3  --   HGB 12.8* 11.5*  HCT 39.8 36.4*  MCV 91.3 92.2  PLT 158 150    Time coordinating discharge: Approximately 40 minutes  Patrecia Pour, MD  [facility] 06/10/2017, 4:55 PM Pager [phone]

## 2017-06-10 ENCOUNTER — Telehealth: Payer: Self-pay | Admitting: Medical

## 2017-06-10 LAB — CALCIUM, URINE, RANDOM: Calcium, Ur: 26.3 mg/dL

## 2017-06-10 LAB — VITAMIN D 25 HYDROXY (VIT D DEFICIENCY, FRACTURES): VIT D 25 HYDROXY: 30.6 ng/mL (ref 30.0–100.0)

## 2017-06-10 LAB — PARATHYROID HORMONE, INTACT (NO CA): PTH: 184 pg/mL — ABNORMAL HIGH (ref 15–65)

## 2017-06-10 NOTE — Telephone Encounter (Signed)
Left message for pt to call. Needs a hospital follow up appt.

## 2017-06-11 LAB — TACROLIMUS LEVEL: Tacrolimus (FK506) - LabCorp: 6.7 ng/mL (ref 2.0–20.0)

## 2017-06-13 LAB — CULTURE, BLOOD (ROUTINE X 2): Culture: NO GROWTH

## 2017-06-15 LAB — PTH-RELATED PEPTIDE: PTH-related peptide: <2 pmol/L
